# Patient Record
Sex: Female | Born: 1935 | Race: White | Hispanic: No | Marital: Married | Smoking: Never smoker
Health system: Southern US, Community
[De-identification: ages and names within clinical notes are randomized; demographics above are authoritative.]

## PROBLEM LIST (undated history)

## (undated) DIAGNOSIS — E7800 Pure hypercholesterolemia, unspecified: Secondary | ICD-10-CM

## (undated) DIAGNOSIS — K598 Other specified functional intestinal disorders: Secondary | ICD-10-CM

## (undated) DIAGNOSIS — I499 Cardiac arrhythmia, unspecified: Secondary | ICD-10-CM

## (undated) DIAGNOSIS — H3530 Unspecified macular degeneration: Secondary | ICD-10-CM

## (undated) DIAGNOSIS — R42 Dizziness and giddiness: Secondary | ICD-10-CM

## (undated) DIAGNOSIS — M1990 Unspecified osteoarthritis, unspecified site: Secondary | ICD-10-CM

## (undated) DIAGNOSIS — K5981 Ogilvie syndrome: Secondary | ICD-10-CM

## (undated) DIAGNOSIS — Z87442 Personal history of urinary calculi: Secondary | ICD-10-CM

## (undated) DIAGNOSIS — G92 Toxic encephalopathy: Secondary | ICD-10-CM

## (undated) DIAGNOSIS — K219 Gastro-esophageal reflux disease without esophagitis: Secondary | ICD-10-CM

## (undated) DIAGNOSIS — H9190 Unspecified hearing loss, unspecified ear: Secondary | ICD-10-CM

## (undated) DIAGNOSIS — M419 Scoliosis, unspecified: Secondary | ICD-10-CM

## (undated) DIAGNOSIS — I4891 Unspecified atrial fibrillation: Secondary | ICD-10-CM

## (undated) DIAGNOSIS — C801 Malignant (primary) neoplasm, unspecified: Secondary | ICD-10-CM

## (undated) DIAGNOSIS — M7120 Synovial cyst of popliteal space [Baker], unspecified knee: Secondary | ICD-10-CM

## (undated) DIAGNOSIS — G928 Other toxic encephalopathy: Secondary | ICD-10-CM

## (undated) DIAGNOSIS — R296 Repeated falls: Secondary | ICD-10-CM

## (undated) DIAGNOSIS — J9602 Acute respiratory failure with hypercapnia: Secondary | ICD-10-CM

## (undated) DIAGNOSIS — R001 Bradycardia, unspecified: Secondary | ICD-10-CM

## (undated) DIAGNOSIS — R011 Cardiac murmur, unspecified: Secondary | ICD-10-CM

## (undated) DIAGNOSIS — Z8709 Personal history of other diseases of the respiratory system: Secondary | ICD-10-CM

## (undated) DIAGNOSIS — Z8679 Personal history of other diseases of the circulatory system: Secondary | ICD-10-CM

## (undated) DIAGNOSIS — R600 Localized edema: Secondary | ICD-10-CM

## (undated) DIAGNOSIS — I10 Essential (primary) hypertension: Secondary | ICD-10-CM

## (undated) DIAGNOSIS — J9601 Acute respiratory failure with hypoxia: Secondary | ICD-10-CM

## (undated) DIAGNOSIS — IMO0001 Reserved for inherently not codable concepts without codable children: Secondary | ICD-10-CM

## (undated) DIAGNOSIS — K859 Acute pancreatitis without necrosis or infection, unspecified: Secondary | ICD-10-CM

## (undated) HISTORY — PX: OTHER SURGICAL HISTORY: SHX169

## (undated) HISTORY — PX: VAGINAL HYSTERECTOMY: SHX2639

## (undated) HISTORY — PX: DILATION AND CURETTAGE OF UTERUS: SHX78

## (undated) HISTORY — DX: Toxic encephalopathy: G92

## (undated) HISTORY — DX: Other specified functional intestinal disorders: K59.8

## (undated) HISTORY — PX: TONSILLECTOMY: SUR1361

## (undated) HISTORY — DX: Bradycardia, unspecified: R00.1

## (undated) HISTORY — PX: KNEE ARTHROSCOPY: SUR90

## (undated) HISTORY — DX: Pure hypercholesterolemia, unspecified: E78.00

## (undated) HISTORY — DX: Acute respiratory failure with hypoxia: J96.01

## (undated) HISTORY — DX: Ogilvie syndrome: K59.81

## (undated) HISTORY — DX: Other toxic encephalopathy: G92.8

## (undated) HISTORY — PX: CARDIOVERSION: SHX1299

## (undated) HISTORY — DX: Essential (primary) hypertension: I10

## (undated) HISTORY — DX: Unspecified macular degeneration: H35.30

## (undated) HISTORY — DX: Acute respiratory failure with hypercapnia: J96.02

## (undated) HISTORY — DX: Acute pancreatitis without necrosis or infection, unspecified: K85.90

## (undated) HISTORY — DX: Unspecified atrial fibrillation: I48.91

## (undated) HISTORY — PX: FOOT SURGERY: SHX648

---

## 2000-12-14 ENCOUNTER — Encounter: Admission: RE | Admit: 2000-12-14 | Discharge: 2000-12-14 | Payer: Self-pay | Admitting: Family Medicine

## 2001-08-07 ENCOUNTER — Encounter: Admission: RE | Admit: 2001-08-07 | Discharge: 2001-08-07 | Payer: Self-pay | Admitting: Family Medicine

## 2001-08-07 ENCOUNTER — Encounter: Payer: Self-pay | Admitting: Family Medicine

## 2003-01-31 ENCOUNTER — Encounter: Admission: RE | Admit: 2003-01-31 | Discharge: 2003-01-31 | Payer: Self-pay | Admitting: Family Medicine

## 2003-01-31 ENCOUNTER — Encounter: Payer: Self-pay | Admitting: Family Medicine

## 2004-10-08 ENCOUNTER — Encounter: Admission: RE | Admit: 2004-10-08 | Discharge: 2004-10-08 | Payer: Self-pay | Admitting: Family Medicine

## 2006-06-06 ENCOUNTER — Encounter: Admission: RE | Admit: 2006-06-06 | Discharge: 2006-06-06 | Payer: Self-pay | Admitting: Family Medicine

## 2007-06-19 ENCOUNTER — Encounter: Admission: RE | Admit: 2007-06-19 | Discharge: 2007-06-19 | Payer: Self-pay | Admitting: Family Medicine

## 2008-06-19 ENCOUNTER — Encounter: Admission: RE | Admit: 2008-06-19 | Discharge: 2008-06-19 | Payer: Self-pay | Admitting: Family Medicine

## 2009-06-22 ENCOUNTER — Encounter: Admission: RE | Admit: 2009-06-22 | Discharge: 2009-06-22 | Payer: Self-pay | Admitting: Family Medicine

## 2010-07-13 ENCOUNTER — Encounter: Admission: RE | Admit: 2010-07-13 | Discharge: 2010-07-13 | Payer: Self-pay | Admitting: Family Medicine

## 2011-07-25 ENCOUNTER — Other Ambulatory Visit: Payer: Self-pay | Admitting: Family Medicine

## 2011-07-25 DIAGNOSIS — Z1231 Encounter for screening mammogram for malignant neoplasm of breast: Secondary | ICD-10-CM

## 2011-08-24 ENCOUNTER — Ambulatory Visit: Payer: Self-pay

## 2011-08-25 ENCOUNTER — Ambulatory Visit
Admission: RE | Admit: 2011-08-25 | Discharge: 2011-08-25 | Disposition: A | Discharge status: Home / Self Care | Payer: PRIVATE HEALTH INSURANCE | Source: Ambulatory Visit | Attending: Family Medicine | Admitting: Family Medicine

## 2011-08-25 DIAGNOSIS — Z1231 Encounter for screening mammogram for malignant neoplasm of breast: Secondary | ICD-10-CM

## 2011-11-03 DIAGNOSIS — H9190 Unspecified hearing loss, unspecified ear: Secondary | ICD-10-CM | POA: Diagnosis not present

## 2011-11-03 DIAGNOSIS — I10 Essential (primary) hypertension: Secondary | ICD-10-CM | POA: Diagnosis not present

## 2011-11-03 DIAGNOSIS — I4892 Unspecified atrial flutter: Secondary | ICD-10-CM | POA: Diagnosis not present

## 2011-11-03 DIAGNOSIS — E7800 Pure hypercholesterolemia, unspecified: Secondary | ICD-10-CM | POA: Diagnosis not present

## 2011-11-10 DIAGNOSIS — I4892 Unspecified atrial flutter: Secondary | ICD-10-CM | POA: Diagnosis not present

## 2011-11-14 DIAGNOSIS — H353290 Exudative age-related macular degeneration, unspecified eye, stage unspecified: Secondary | ICD-10-CM | POA: Diagnosis not present

## 2011-11-21 DIAGNOSIS — Z7901 Long term (current) use of anticoagulants: Secondary | ICD-10-CM | POA: Diagnosis not present

## 2011-11-21 DIAGNOSIS — I4892 Unspecified atrial flutter: Secondary | ICD-10-CM | POA: Diagnosis not present

## 2011-11-23 DIAGNOSIS — I4892 Unspecified atrial flutter: Secondary | ICD-10-CM | POA: Diagnosis not present

## 2011-11-23 DIAGNOSIS — M1990 Unspecified osteoarthritis, unspecified site: Secondary | ICD-10-CM | POA: Diagnosis not present

## 2011-11-23 DIAGNOSIS — E7800 Pure hypercholesterolemia, unspecified: Secondary | ICD-10-CM | POA: Diagnosis not present

## 2011-11-23 DIAGNOSIS — I10 Essential (primary) hypertension: Secondary | ICD-10-CM | POA: Diagnosis not present

## 2011-11-28 DIAGNOSIS — Z7901 Long term (current) use of anticoagulants: Secondary | ICD-10-CM | POA: Diagnosis not present

## 2011-12-05 DIAGNOSIS — Z7901 Long term (current) use of anticoagulants: Secondary | ICD-10-CM | POA: Diagnosis not present

## 2011-12-05 DIAGNOSIS — I4892 Unspecified atrial flutter: Secondary | ICD-10-CM | POA: Diagnosis not present

## 2011-12-20 DIAGNOSIS — I4892 Unspecified atrial flutter: Secondary | ICD-10-CM | POA: Diagnosis not present

## 2011-12-20 DIAGNOSIS — Z7901 Long term (current) use of anticoagulants: Secondary | ICD-10-CM | POA: Diagnosis not present

## 2011-12-29 DIAGNOSIS — I4892 Unspecified atrial flutter: Secondary | ICD-10-CM | POA: Diagnosis not present

## 2011-12-29 DIAGNOSIS — I10 Essential (primary) hypertension: Secondary | ICD-10-CM | POA: Diagnosis not present

## 2011-12-29 DIAGNOSIS — Z7901 Long term (current) use of anticoagulants: Secondary | ICD-10-CM | POA: Diagnosis not present

## 2012-01-10 DIAGNOSIS — I4892 Unspecified atrial flutter: Secondary | ICD-10-CM | POA: Diagnosis not present

## 2012-01-10 DIAGNOSIS — Z7901 Long term (current) use of anticoagulants: Secondary | ICD-10-CM | POA: Diagnosis not present

## 2012-01-18 DIAGNOSIS — Z7901 Long term (current) use of anticoagulants: Secondary | ICD-10-CM | POA: Diagnosis not present

## 2012-01-18 DIAGNOSIS — I4892 Unspecified atrial flutter: Secondary | ICD-10-CM | POA: Diagnosis not present

## 2012-02-01 DIAGNOSIS — Z7901 Long term (current) use of anticoagulants: Secondary | ICD-10-CM | POA: Diagnosis not present

## 2012-02-01 DIAGNOSIS — I4892 Unspecified atrial flutter: Secondary | ICD-10-CM | POA: Diagnosis not present

## 2012-02-21 DIAGNOSIS — I10 Essential (primary) hypertension: Secondary | ICD-10-CM | POA: Diagnosis not present

## 2012-02-21 DIAGNOSIS — E7800 Pure hypercholesterolemia, unspecified: Secondary | ICD-10-CM | POA: Diagnosis not present

## 2012-02-21 DIAGNOSIS — I4892 Unspecified atrial flutter: Secondary | ICD-10-CM | POA: Diagnosis not present

## 2012-02-21 DIAGNOSIS — Z7901 Long term (current) use of anticoagulants: Secondary | ICD-10-CM | POA: Diagnosis not present

## 2012-03-08 DIAGNOSIS — I4892 Unspecified atrial flutter: Secondary | ICD-10-CM | POA: Diagnosis not present

## 2012-03-08 DIAGNOSIS — Z7901 Long term (current) use of anticoagulants: Secondary | ICD-10-CM | POA: Diagnosis not present

## 2012-03-12 DIAGNOSIS — H353290 Exudative age-related macular degeneration, unspecified eye, stage unspecified: Secondary | ICD-10-CM | POA: Diagnosis not present

## 2012-03-15 DIAGNOSIS — I4892 Unspecified atrial flutter: Secondary | ICD-10-CM | POA: Diagnosis not present

## 2012-03-15 DIAGNOSIS — Z7901 Long term (current) use of anticoagulants: Secondary | ICD-10-CM | POA: Diagnosis not present

## 2012-03-22 DIAGNOSIS — I4892 Unspecified atrial flutter: Secondary | ICD-10-CM | POA: Diagnosis not present

## 2012-03-22 DIAGNOSIS — Z7901 Long term (current) use of anticoagulants: Secondary | ICD-10-CM | POA: Diagnosis not present

## 2012-04-05 DIAGNOSIS — I4892 Unspecified atrial flutter: Secondary | ICD-10-CM | POA: Diagnosis not present

## 2012-04-19 DIAGNOSIS — I4892 Unspecified atrial flutter: Secondary | ICD-10-CM | POA: Diagnosis not present

## 2012-05-12 DIAGNOSIS — L988 Other specified disorders of the skin and subcutaneous tissue: Secondary | ICD-10-CM | POA: Diagnosis not present

## 2012-05-17 DIAGNOSIS — C44529 Squamous cell carcinoma of skin of other part of trunk: Secondary | ICD-10-CM | POA: Diagnosis not present

## 2012-05-17 DIAGNOSIS — M6740 Ganglion, unspecified site: Secondary | ICD-10-CM | POA: Diagnosis not present

## 2012-05-17 DIAGNOSIS — L820 Inflamed seborrheic keratosis: Secondary | ICD-10-CM | POA: Diagnosis not present

## 2012-05-22 DIAGNOSIS — I4892 Unspecified atrial flutter: Secondary | ICD-10-CM | POA: Diagnosis not present

## 2012-05-22 DIAGNOSIS — Z7901 Long term (current) use of anticoagulants: Secondary | ICD-10-CM | POA: Diagnosis not present

## 2012-06-06 DIAGNOSIS — Z85828 Personal history of other malignant neoplasm of skin: Secondary | ICD-10-CM | POA: Diagnosis not present

## 2012-06-06 DIAGNOSIS — M6740 Ganglion, unspecified site: Secondary | ICD-10-CM | POA: Diagnosis not present

## 2012-06-11 DIAGNOSIS — Z7901 Long term (current) use of anticoagulants: Secondary | ICD-10-CM | POA: Diagnosis not present

## 2012-06-11 DIAGNOSIS — I4892 Unspecified atrial flutter: Secondary | ICD-10-CM | POA: Diagnosis not present

## 2012-07-11 DIAGNOSIS — I4892 Unspecified atrial flutter: Secondary | ICD-10-CM | POA: Diagnosis not present

## 2012-07-11 DIAGNOSIS — E7800 Pure hypercholesterolemia, unspecified: Secondary | ICD-10-CM | POA: Diagnosis not present

## 2012-07-11 DIAGNOSIS — Z7901 Long term (current) use of anticoagulants: Secondary | ICD-10-CM | POA: Diagnosis not present

## 2012-07-11 DIAGNOSIS — I10 Essential (primary) hypertension: Secondary | ICD-10-CM | POA: Diagnosis not present

## 2012-07-23 DIAGNOSIS — H35369 Drusen (degenerative) of macula, unspecified eye: Secondary | ICD-10-CM | POA: Diagnosis not present

## 2012-07-23 DIAGNOSIS — H353290 Exudative age-related macular degeneration, unspecified eye, stage unspecified: Secondary | ICD-10-CM | POA: Diagnosis not present

## 2012-07-23 DIAGNOSIS — H269 Unspecified cataract: Secondary | ICD-10-CM | POA: Diagnosis not present

## 2012-07-26 DIAGNOSIS — I4892 Unspecified atrial flutter: Secondary | ICD-10-CM | POA: Diagnosis not present

## 2012-07-27 DIAGNOSIS — I4891 Unspecified atrial fibrillation: Secondary | ICD-10-CM | POA: Diagnosis not present

## 2012-08-15 DIAGNOSIS — E7800 Pure hypercholesterolemia, unspecified: Secondary | ICD-10-CM | POA: Diagnosis not present

## 2012-08-15 DIAGNOSIS — I10 Essential (primary) hypertension: Secondary | ICD-10-CM | POA: Diagnosis not present

## 2012-08-15 DIAGNOSIS — I4892 Unspecified atrial flutter: Secondary | ICD-10-CM | POA: Diagnosis not present

## 2012-08-17 DIAGNOSIS — Z23 Encounter for immunization: Secondary | ICD-10-CM | POA: Diagnosis not present

## 2012-08-28 ENCOUNTER — Other Ambulatory Visit: Payer: Self-pay | Admitting: Family Medicine

## 2012-08-28 DIAGNOSIS — Z1231 Encounter for screening mammogram for malignant neoplasm of breast: Secondary | ICD-10-CM

## 2012-08-30 DIAGNOSIS — K219 Gastro-esophageal reflux disease without esophagitis: Secondary | ICD-10-CM | POA: Diagnosis not present

## 2012-08-30 DIAGNOSIS — E7800 Pure hypercholesterolemia, unspecified: Secondary | ICD-10-CM | POA: Diagnosis not present

## 2012-08-30 DIAGNOSIS — Z79899 Other long term (current) drug therapy: Secondary | ICD-10-CM | POA: Diagnosis not present

## 2012-08-30 DIAGNOSIS — I10 Essential (primary) hypertension: Secondary | ICD-10-CM | POA: Diagnosis not present

## 2012-08-30 DIAGNOSIS — R109 Unspecified abdominal pain: Secondary | ICD-10-CM | POA: Diagnosis not present

## 2012-09-03 DIAGNOSIS — R109 Unspecified abdominal pain: Secondary | ICD-10-CM | POA: Diagnosis not present

## 2012-09-03 DIAGNOSIS — I4891 Unspecified atrial fibrillation: Secondary | ICD-10-CM | POA: Diagnosis not present

## 2012-10-09 ENCOUNTER — Ambulatory Visit
Admission: RE | Admit: 2012-10-09 | Discharge: 2012-10-09 | Disposition: A | Discharge status: Home / Self Care | Payer: PRIVATE HEALTH INSURANCE | Source: Ambulatory Visit | Attending: Family Medicine | Admitting: Family Medicine

## 2012-10-09 DIAGNOSIS — Z1231 Encounter for screening mammogram for malignant neoplasm of breast: Secondary | ICD-10-CM | POA: Diagnosis not present

## 2012-10-29 DIAGNOSIS — I4891 Unspecified atrial fibrillation: Secondary | ICD-10-CM | POA: Diagnosis not present

## 2012-11-13 DIAGNOSIS — I4891 Unspecified atrial fibrillation: Secondary | ICD-10-CM | POA: Diagnosis not present

## 2012-11-13 DIAGNOSIS — Z7901 Long term (current) use of anticoagulants: Secondary | ICD-10-CM | POA: Diagnosis not present

## 2012-12-17 DIAGNOSIS — H353290 Exudative age-related macular degeneration, unspecified eye, stage unspecified: Secondary | ICD-10-CM | POA: Diagnosis not present

## 2012-12-17 DIAGNOSIS — H269 Unspecified cataract: Secondary | ICD-10-CM | POA: Diagnosis not present

## 2012-12-24 DIAGNOSIS — I4892 Unspecified atrial flutter: Secondary | ICD-10-CM | POA: Diagnosis not present

## 2012-12-24 DIAGNOSIS — M810 Age-related osteoporosis without current pathological fracture: Secondary | ICD-10-CM | POA: Diagnosis not present

## 2012-12-24 DIAGNOSIS — I10 Essential (primary) hypertension: Secondary | ICD-10-CM | POA: Diagnosis not present

## 2012-12-24 DIAGNOSIS — E7800 Pure hypercholesterolemia, unspecified: Secondary | ICD-10-CM | POA: Diagnosis not present

## 2013-01-08 DIAGNOSIS — I4891 Unspecified atrial fibrillation: Secondary | ICD-10-CM | POA: Diagnosis not present

## 2013-01-21 DIAGNOSIS — L820 Inflamed seborrheic keratosis: Secondary | ICD-10-CM | POA: Diagnosis not present

## 2013-01-21 DIAGNOSIS — Z85828 Personal history of other malignant neoplasm of skin: Secondary | ICD-10-CM | POA: Diagnosis not present

## 2013-02-21 DIAGNOSIS — I4892 Unspecified atrial flutter: Secondary | ICD-10-CM | POA: Diagnosis not present

## 2013-02-21 DIAGNOSIS — M810 Age-related osteoporosis without current pathological fracture: Secondary | ICD-10-CM | POA: Diagnosis not present

## 2013-02-21 DIAGNOSIS — E7800 Pure hypercholesterolemia, unspecified: Secondary | ICD-10-CM | POA: Diagnosis not present

## 2013-02-21 DIAGNOSIS — I10 Essential (primary) hypertension: Secondary | ICD-10-CM | POA: Diagnosis not present

## 2013-03-05 DIAGNOSIS — I4891 Unspecified atrial fibrillation: Secondary | ICD-10-CM | POA: Diagnosis not present

## 2013-03-14 DIAGNOSIS — G4733 Obstructive sleep apnea (adult) (pediatric): Secondary | ICD-10-CM | POA: Diagnosis not present

## 2013-03-26 DIAGNOSIS — J029 Acute pharyngitis, unspecified: Secondary | ICD-10-CM | POA: Diagnosis not present

## 2013-04-24 DIAGNOSIS — I4891 Unspecified atrial fibrillation: Secondary | ICD-10-CM | POA: Diagnosis not present

## 2013-05-17 DIAGNOSIS — H353290 Exudative age-related macular degeneration, unspecified eye, stage unspecified: Secondary | ICD-10-CM | POA: Diagnosis not present

## 2013-05-17 DIAGNOSIS — H269 Unspecified cataract: Secondary | ICD-10-CM | POA: Diagnosis not present

## 2013-06-17 DIAGNOSIS — I4891 Unspecified atrial fibrillation: Secondary | ICD-10-CM | POA: Diagnosis not present

## 2013-08-14 DIAGNOSIS — I4891 Unspecified atrial fibrillation: Secondary | ICD-10-CM | POA: Diagnosis not present

## 2013-08-16 DIAGNOSIS — I10 Essential (primary) hypertension: Secondary | ICD-10-CM | POA: Diagnosis not present

## 2013-08-16 DIAGNOSIS — Z23 Encounter for immunization: Secondary | ICD-10-CM | POA: Diagnosis not present

## 2013-08-16 DIAGNOSIS — E7800 Pure hypercholesterolemia, unspecified: Secondary | ICD-10-CM | POA: Diagnosis not present

## 2013-08-16 DIAGNOSIS — K219 Gastro-esophageal reflux disease without esophagitis: Secondary | ICD-10-CM | POA: Diagnosis not present

## 2013-08-16 DIAGNOSIS — I4892 Unspecified atrial flutter: Secondary | ICD-10-CM | POA: Diagnosis not present

## 2013-08-16 DIAGNOSIS — Z0000 Encounter for general adult medical examination without abnormal findings: Secondary | ICD-10-CM | POA: Diagnosis not present

## 2013-08-16 DIAGNOSIS — Z79899 Other long term (current) drug therapy: Secondary | ICD-10-CM | POA: Diagnosis not present

## 2013-08-19 DIAGNOSIS — Z85828 Personal history of other malignant neoplasm of skin: Secondary | ICD-10-CM | POA: Diagnosis not present

## 2013-08-19 DIAGNOSIS — L570 Actinic keratosis: Secondary | ICD-10-CM | POA: Diagnosis not present

## 2013-12-02 DIAGNOSIS — I4891 Unspecified atrial fibrillation: Secondary | ICD-10-CM | POA: Diagnosis not present

## 2013-12-04 DIAGNOSIS — H2510 Age-related nuclear cataract, unspecified eye: Secondary | ICD-10-CM | POA: Diagnosis not present

## 2013-12-04 DIAGNOSIS — H43819 Vitreous degeneration, unspecified eye: Secondary | ICD-10-CM | POA: Diagnosis not present

## 2013-12-04 DIAGNOSIS — H353290 Exudative age-related macular degeneration, unspecified eye, stage unspecified: Secondary | ICD-10-CM | POA: Diagnosis not present

## 2014-01-20 DIAGNOSIS — I10 Essential (primary) hypertension: Secondary | ICD-10-CM | POA: Diagnosis not present

## 2014-01-20 DIAGNOSIS — Z7901 Long term (current) use of anticoagulants: Secondary | ICD-10-CM | POA: Diagnosis not present

## 2014-01-20 DIAGNOSIS — I4891 Unspecified atrial fibrillation: Secondary | ICD-10-CM | POA: Diagnosis not present

## 2014-01-20 DIAGNOSIS — I4892 Unspecified atrial flutter: Secondary | ICD-10-CM | POA: Diagnosis not present

## 2014-01-20 DIAGNOSIS — E7800 Pure hypercholesterolemia, unspecified: Secondary | ICD-10-CM | POA: Diagnosis not present

## 2014-01-22 DIAGNOSIS — I4891 Unspecified atrial fibrillation: Secondary | ICD-10-CM | POA: Diagnosis not present

## 2014-01-22 DIAGNOSIS — I10 Essential (primary) hypertension: Secondary | ICD-10-CM | POA: Diagnosis not present

## 2014-01-22 DIAGNOSIS — E7800 Pure hypercholesterolemia, unspecified: Secondary | ICD-10-CM | POA: Diagnosis not present

## 2014-01-28 DIAGNOSIS — I4891 Unspecified atrial fibrillation: Secondary | ICD-10-CM | POA: Diagnosis not present

## 2014-01-28 DIAGNOSIS — I4892 Unspecified atrial flutter: Secondary | ICD-10-CM | POA: Diagnosis not present

## 2014-01-28 DIAGNOSIS — E7800 Pure hypercholesterolemia, unspecified: Secondary | ICD-10-CM | POA: Diagnosis not present

## 2014-01-30 DIAGNOSIS — Z7901 Long term (current) use of anticoagulants: Secondary | ICD-10-CM | POA: Diagnosis not present

## 2014-01-30 DIAGNOSIS — I4891 Unspecified atrial fibrillation: Secondary | ICD-10-CM | POA: Diagnosis not present

## 2014-01-30 DIAGNOSIS — E7800 Pure hypercholesterolemia, unspecified: Secondary | ICD-10-CM | POA: Diagnosis not present

## 2014-01-30 DIAGNOSIS — I10 Essential (primary) hypertension: Secondary | ICD-10-CM | POA: Diagnosis not present

## 2014-02-13 DIAGNOSIS — I4891 Unspecified atrial fibrillation: Secondary | ICD-10-CM | POA: Diagnosis not present

## 2014-02-13 DIAGNOSIS — I10 Essential (primary) hypertension: Secondary | ICD-10-CM | POA: Diagnosis not present

## 2014-02-13 DIAGNOSIS — Z7901 Long term (current) use of anticoagulants: Secondary | ICD-10-CM | POA: Diagnosis not present

## 2014-02-13 DIAGNOSIS — E7800 Pure hypercholesterolemia, unspecified: Secondary | ICD-10-CM | POA: Diagnosis not present

## 2014-02-17 DIAGNOSIS — Z85828 Personal history of other malignant neoplasm of skin: Secondary | ICD-10-CM | POA: Diagnosis not present

## 2014-02-17 DIAGNOSIS — L820 Inflamed seborrheic keratosis: Secondary | ICD-10-CM | POA: Diagnosis not present

## 2014-02-17 DIAGNOSIS — L570 Actinic keratosis: Secondary | ICD-10-CM | POA: Diagnosis not present

## 2014-02-17 DIAGNOSIS — D485 Neoplasm of uncertain behavior of skin: Secondary | ICD-10-CM | POA: Diagnosis not present

## 2014-03-17 DIAGNOSIS — I4892 Unspecified atrial flutter: Secondary | ICD-10-CM | POA: Diagnosis not present

## 2014-03-17 DIAGNOSIS — I10 Essential (primary) hypertension: Secondary | ICD-10-CM | POA: Diagnosis not present

## 2014-03-17 DIAGNOSIS — E7800 Pure hypercholesterolemia, unspecified: Secondary | ICD-10-CM | POA: Diagnosis not present

## 2014-03-17 DIAGNOSIS — I4891 Unspecified atrial fibrillation: Secondary | ICD-10-CM | POA: Diagnosis not present

## 2014-03-26 DIAGNOSIS — D485 Neoplasm of uncertain behavior of skin: Secondary | ICD-10-CM | POA: Diagnosis not present

## 2014-03-26 DIAGNOSIS — L820 Inflamed seborrheic keratosis: Secondary | ICD-10-CM | POA: Diagnosis not present

## 2014-04-03 DIAGNOSIS — I4891 Unspecified atrial fibrillation: Secondary | ICD-10-CM | POA: Diagnosis not present

## 2014-04-10 DIAGNOSIS — I4891 Unspecified atrial fibrillation: Secondary | ICD-10-CM | POA: Diagnosis not present

## 2014-04-16 DIAGNOSIS — I4892 Unspecified atrial flutter: Secondary | ICD-10-CM | POA: Diagnosis not present

## 2014-04-16 DIAGNOSIS — H9190 Unspecified hearing loss, unspecified ear: Secondary | ICD-10-CM | POA: Diagnosis not present

## 2014-04-16 DIAGNOSIS — E7800 Pure hypercholesterolemia, unspecified: Secondary | ICD-10-CM | POA: Diagnosis not present

## 2014-04-16 DIAGNOSIS — I10 Essential (primary) hypertension: Secondary | ICD-10-CM | POA: Diagnosis not present

## 2014-04-16 DIAGNOSIS — I4891 Unspecified atrial fibrillation: Secondary | ICD-10-CM | POA: Diagnosis not present

## 2014-05-30 DIAGNOSIS — I10 Essential (primary) hypertension: Secondary | ICD-10-CM | POA: Diagnosis not present

## 2014-05-30 DIAGNOSIS — I4891 Unspecified atrial fibrillation: Secondary | ICD-10-CM | POA: Diagnosis not present

## 2014-05-30 DIAGNOSIS — Z7901 Long term (current) use of anticoagulants: Secondary | ICD-10-CM | POA: Diagnosis not present

## 2014-06-04 DIAGNOSIS — H353290 Exudative age-related macular degeneration, unspecified eye, stage unspecified: Secondary | ICD-10-CM | POA: Diagnosis not present

## 2014-06-04 DIAGNOSIS — H43819 Vitreous degeneration, unspecified eye: Secondary | ICD-10-CM | POA: Diagnosis not present

## 2014-06-04 DIAGNOSIS — H31019 Macula scars of posterior pole (postinflammatory) (post-traumatic), unspecified eye: Secondary | ICD-10-CM | POA: Diagnosis not present

## 2014-06-04 DIAGNOSIS — H2510 Age-related nuclear cataract, unspecified eye: Secondary | ICD-10-CM | POA: Diagnosis not present

## 2014-06-10 DIAGNOSIS — E7800 Pure hypercholesterolemia, unspecified: Secondary | ICD-10-CM | POA: Diagnosis not present

## 2014-06-10 DIAGNOSIS — G4733 Obstructive sleep apnea (adult) (pediatric): Secondary | ICD-10-CM | POA: Diagnosis not present

## 2014-06-10 DIAGNOSIS — I10 Essential (primary) hypertension: Secondary | ICD-10-CM | POA: Diagnosis not present

## 2014-06-10 DIAGNOSIS — I4891 Unspecified atrial fibrillation: Secondary | ICD-10-CM | POA: Diagnosis not present

## 2014-06-10 DIAGNOSIS — I4892 Unspecified atrial flutter: Secondary | ICD-10-CM | POA: Diagnosis not present

## 2014-06-16 DIAGNOSIS — I4891 Unspecified atrial fibrillation: Secondary | ICD-10-CM | POA: Diagnosis not present

## 2014-06-16 DIAGNOSIS — Z79899 Other long term (current) drug therapy: Secondary | ICD-10-CM | POA: Diagnosis not present

## 2014-06-23 DIAGNOSIS — I10 Essential (primary) hypertension: Secondary | ICD-10-CM | POA: Diagnosis not present

## 2014-06-23 DIAGNOSIS — I4891 Unspecified atrial fibrillation: Secondary | ICD-10-CM | POA: Diagnosis not present

## 2014-06-23 DIAGNOSIS — J984 Other disorders of lung: Secondary | ICD-10-CM | POA: Diagnosis not present

## 2014-06-23 DIAGNOSIS — K219 Gastro-esophageal reflux disease without esophagitis: Secondary | ICD-10-CM | POA: Diagnosis not present

## 2014-06-23 DIAGNOSIS — Z79899 Other long term (current) drug therapy: Secondary | ICD-10-CM | POA: Diagnosis not present

## 2014-06-23 DIAGNOSIS — I4892 Unspecified atrial flutter: Secondary | ICD-10-CM | POA: Diagnosis not present

## 2014-07-15 DIAGNOSIS — R942 Abnormal results of pulmonary function studies: Secondary | ICD-10-CM | POA: Diagnosis not present

## 2014-07-15 DIAGNOSIS — I4892 Unspecified atrial flutter: Secondary | ICD-10-CM | POA: Diagnosis not present

## 2014-07-15 DIAGNOSIS — I10 Essential (primary) hypertension: Secondary | ICD-10-CM | POA: Diagnosis not present

## 2014-07-15 DIAGNOSIS — G4733 Obstructive sleep apnea (adult) (pediatric): Secondary | ICD-10-CM | POA: Diagnosis not present

## 2014-07-15 DIAGNOSIS — I4891 Unspecified atrial fibrillation: Secondary | ICD-10-CM | POA: Diagnosis not present

## 2014-08-11 DIAGNOSIS — R42 Dizziness and giddiness: Secondary | ICD-10-CM | POA: Diagnosis not present

## 2014-08-11 DIAGNOSIS — I10 Essential (primary) hypertension: Secondary | ICD-10-CM | POA: Diagnosis not present

## 2014-08-19 DIAGNOSIS — M76822 Posterior tibial tendinitis, left leg: Secondary | ICD-10-CM | POA: Diagnosis not present

## 2014-08-19 DIAGNOSIS — M216X2 Other acquired deformities of left foot: Secondary | ICD-10-CM | POA: Diagnosis not present

## 2014-08-19 DIAGNOSIS — M25372 Other instability, left ankle: Secondary | ICD-10-CM | POA: Diagnosis not present

## 2014-08-20 DIAGNOSIS — R42 Dizziness and giddiness: Secondary | ICD-10-CM | POA: Diagnosis not present

## 2014-08-21 DIAGNOSIS — Z23 Encounter for immunization: Secondary | ICD-10-CM | POA: Diagnosis not present

## 2014-08-25 DIAGNOSIS — Z08 Encounter for follow-up examination after completed treatment for malignant neoplasm: Secondary | ICD-10-CM | POA: Diagnosis not present

## 2014-08-25 DIAGNOSIS — Z85828 Personal history of other malignant neoplasm of skin: Secondary | ICD-10-CM | POA: Diagnosis not present

## 2014-08-25 DIAGNOSIS — L570 Actinic keratosis: Secondary | ICD-10-CM | POA: Diagnosis not present

## 2014-08-26 DIAGNOSIS — M1712 Unilateral primary osteoarthritis, left knee: Secondary | ICD-10-CM | POA: Diagnosis not present

## 2014-08-26 DIAGNOSIS — E780 Pure hypercholesterolemia: Secondary | ICD-10-CM | POA: Diagnosis not present

## 2014-08-26 DIAGNOSIS — I4891 Unspecified atrial fibrillation: Secondary | ICD-10-CM | POA: Diagnosis not present

## 2014-08-26 DIAGNOSIS — I10 Essential (primary) hypertension: Secondary | ICD-10-CM | POA: Diagnosis not present

## 2014-08-26 DIAGNOSIS — Z0000 Encounter for general adult medical examination without abnormal findings: Secondary | ICD-10-CM | POA: Diagnosis not present

## 2014-08-26 DIAGNOSIS — K219 Gastro-esophageal reflux disease without esophagitis: Secondary | ICD-10-CM | POA: Diagnosis not present

## 2014-08-26 DIAGNOSIS — Z79899 Other long term (current) drug therapy: Secondary | ICD-10-CM | POA: Diagnosis not present

## 2014-08-26 DIAGNOSIS — Z23 Encounter for immunization: Secondary | ICD-10-CM | POA: Diagnosis not present

## 2014-09-05 DIAGNOSIS — I482 Chronic atrial fibrillation: Secondary | ICD-10-CM | POA: Diagnosis not present

## 2014-09-05 DIAGNOSIS — M25561 Pain in right knee: Secondary | ICD-10-CM | POA: Diagnosis not present

## 2014-09-05 DIAGNOSIS — Z7901 Long term (current) use of anticoagulants: Secondary | ICD-10-CM | POA: Diagnosis not present

## 2014-09-05 DIAGNOSIS — M25562 Pain in left knee: Secondary | ICD-10-CM | POA: Diagnosis not present

## 2014-09-09 ENCOUNTER — Other Ambulatory Visit (HOSPITAL_COMMUNITY): Payer: Self-pay | Admitting: Cardiology

## 2014-09-09 ENCOUNTER — Ambulatory Visit (HOSPITAL_COMMUNITY): Payer: Medicare Other | Attending: Cardiology | Admitting: Cardiology

## 2014-09-09 DIAGNOSIS — I10 Essential (primary) hypertension: Secondary | ICD-10-CM | POA: Insufficient documentation

## 2014-09-09 DIAGNOSIS — M25561 Pain in right knee: Secondary | ICD-10-CM | POA: Diagnosis not present

## 2014-09-09 DIAGNOSIS — E785 Hyperlipidemia, unspecified: Secondary | ICD-10-CM | POA: Insufficient documentation

## 2014-09-09 DIAGNOSIS — M79604 Pain in right leg: Secondary | ICD-10-CM | POA: Diagnosis not present

## 2014-09-09 DIAGNOSIS — M7989 Other specified soft tissue disorders: Secondary | ICD-10-CM

## 2014-09-09 NOTE — Progress Notes (Signed)
Right lower venous duplex performed  

## 2014-09-10 DIAGNOSIS — R2241 Localized swelling, mass and lump, right lower limb: Secondary | ICD-10-CM | POA: Diagnosis not present

## 2014-09-10 DIAGNOSIS — M79604 Pain in right leg: Secondary | ICD-10-CM | POA: Diagnosis not present

## 2014-09-11 DIAGNOSIS — M1711 Unilateral primary osteoarthritis, right knee: Secondary | ICD-10-CM | POA: Diagnosis not present

## 2014-09-11 DIAGNOSIS — Z79899 Other long term (current) drug therapy: Secondary | ICD-10-CM | POA: Diagnosis not present

## 2014-09-16 DIAGNOSIS — M25561 Pain in right knee: Secondary | ICD-10-CM | POA: Diagnosis not present

## 2014-09-30 DIAGNOSIS — M25561 Pain in right knee: Secondary | ICD-10-CM | POA: Diagnosis not present

## 2014-10-06 DIAGNOSIS — Z7901 Long term (current) use of anticoagulants: Secondary | ICD-10-CM | POA: Diagnosis not present

## 2014-10-07 DIAGNOSIS — E780 Pure hypercholesterolemia: Secondary | ICD-10-CM | POA: Diagnosis not present

## 2014-10-07 DIAGNOSIS — I4892 Unspecified atrial flutter: Secondary | ICD-10-CM | POA: Diagnosis not present

## 2014-10-07 DIAGNOSIS — I4891 Unspecified atrial fibrillation: Secondary | ICD-10-CM | POA: Diagnosis not present

## 2014-10-07 DIAGNOSIS — I10 Essential (primary) hypertension: Secondary | ICD-10-CM | POA: Diagnosis not present

## 2014-10-07 DIAGNOSIS — G4733 Obstructive sleep apnea (adult) (pediatric): Secondary | ICD-10-CM | POA: Diagnosis not present

## 2014-10-10 DIAGNOSIS — Z7901 Long term (current) use of anticoagulants: Secondary | ICD-10-CM | POA: Diagnosis not present

## 2014-11-24 DIAGNOSIS — I4891 Unspecified atrial fibrillation: Secondary | ICD-10-CM | POA: Diagnosis not present

## 2014-12-31 DIAGNOSIS — M25561 Pain in right knee: Secondary | ICD-10-CM | POA: Diagnosis not present

## 2015-01-09 DIAGNOSIS — Z01818 Encounter for other preprocedural examination: Secondary | ICD-10-CM | POA: Diagnosis not present

## 2015-01-12 DIAGNOSIS — I4891 Unspecified atrial fibrillation: Secondary | ICD-10-CM | POA: Diagnosis not present

## 2015-01-15 DIAGNOSIS — Z01810 Encounter for preprocedural cardiovascular examination: Secondary | ICD-10-CM | POA: Diagnosis not present

## 2015-01-15 DIAGNOSIS — I480 Paroxysmal atrial fibrillation: Secondary | ICD-10-CM | POA: Diagnosis not present

## 2015-01-29 DIAGNOSIS — S83241A Other tear of medial meniscus, current injury, right knee, initial encounter: Secondary | ICD-10-CM | POA: Diagnosis not present

## 2015-01-29 DIAGNOSIS — S83281A Other tear of lateral meniscus, current injury, right knee, initial encounter: Secondary | ICD-10-CM | POA: Diagnosis not present

## 2015-01-29 DIAGNOSIS — Y929 Unspecified place or not applicable: Secondary | ICD-10-CM | POA: Diagnosis not present

## 2015-01-29 DIAGNOSIS — X58XXXA Exposure to other specified factors, initial encounter: Secondary | ICD-10-CM | POA: Diagnosis not present

## 2015-01-29 DIAGNOSIS — M94261 Chondromalacia, right knee: Secondary | ICD-10-CM | POA: Diagnosis not present

## 2015-02-04 DIAGNOSIS — S83241D Other tear of medial meniscus, current injury, right knee, subsequent encounter: Secondary | ICD-10-CM | POA: Diagnosis not present

## 2015-02-04 DIAGNOSIS — S83281D Other tear of lateral meniscus, current injury, right knee, subsequent encounter: Secondary | ICD-10-CM | POA: Diagnosis not present

## 2015-02-10 DIAGNOSIS — H2513 Age-related nuclear cataract, bilateral: Secondary | ICD-10-CM | POA: Diagnosis not present

## 2015-02-10 DIAGNOSIS — H3532 Exudative age-related macular degeneration: Secondary | ICD-10-CM | POA: Diagnosis not present

## 2015-02-10 DIAGNOSIS — H43813 Vitreous degeneration, bilateral: Secondary | ICD-10-CM | POA: Diagnosis not present

## 2015-02-23 DIAGNOSIS — Z08 Encounter for follow-up examination after completed treatment for malignant neoplasm: Secondary | ICD-10-CM | POA: Diagnosis not present

## 2015-02-23 DIAGNOSIS — L820 Inflamed seborrheic keratosis: Secondary | ICD-10-CM | POA: Diagnosis not present

## 2015-02-23 DIAGNOSIS — L570 Actinic keratosis: Secondary | ICD-10-CM | POA: Diagnosis not present

## 2015-02-23 DIAGNOSIS — Z85828 Personal history of other malignant neoplasm of skin: Secondary | ICD-10-CM | POA: Diagnosis not present

## 2015-03-04 DIAGNOSIS — S83241D Other tear of medial meniscus, current injury, right knee, subsequent encounter: Secondary | ICD-10-CM | POA: Diagnosis not present

## 2015-03-04 DIAGNOSIS — S83281D Other tear of lateral meniscus, current injury, right knee, subsequent encounter: Secondary | ICD-10-CM | POA: Diagnosis not present

## 2015-03-11 DIAGNOSIS — I4891 Unspecified atrial fibrillation: Secondary | ICD-10-CM | POA: Diagnosis not present

## 2015-04-24 DIAGNOSIS — D72829 Elevated white blood cell count, unspecified: Secondary | ICD-10-CM | POA: Diagnosis not present

## 2015-04-24 DIAGNOSIS — J9602 Acute respiratory failure with hypercapnia: Secondary | ICD-10-CM | POA: Diagnosis not present

## 2015-04-24 DIAGNOSIS — I872 Venous insufficiency (chronic) (peripheral): Secondary | ICD-10-CM | POA: Diagnosis present

## 2015-04-24 DIAGNOSIS — K838 Other specified diseases of biliary tract: Secondary | ICD-10-CM | POA: Diagnosis not present

## 2015-04-24 DIAGNOSIS — Z8249 Family history of ischemic heart disease and other diseases of the circulatory system: Secondary | ICD-10-CM | POA: Diagnosis not present

## 2015-04-24 DIAGNOSIS — R1013 Epigastric pain: Secondary | ICD-10-CM | POA: Diagnosis not present

## 2015-04-24 DIAGNOSIS — I482 Chronic atrial fibrillation: Secondary | ICD-10-CM | POA: Diagnosis not present

## 2015-04-24 DIAGNOSIS — R112 Nausea with vomiting, unspecified: Secondary | ICD-10-CM | POA: Diagnosis not present

## 2015-04-24 DIAGNOSIS — E8770 Fluid overload, unspecified: Secondary | ICD-10-CM | POA: Diagnosis not present

## 2015-04-24 DIAGNOSIS — Z883 Allergy status to other anti-infective agents status: Secondary | ICD-10-CM | POA: Diagnosis not present

## 2015-04-24 DIAGNOSIS — R109 Unspecified abdominal pain: Secondary | ICD-10-CM | POA: Diagnosis not present

## 2015-04-24 DIAGNOSIS — N281 Cyst of kidney, acquired: Secondary | ICD-10-CM | POA: Diagnosis not present

## 2015-04-24 DIAGNOSIS — R140 Abdominal distension (gaseous): Secondary | ICD-10-CM | POA: Diagnosis not present

## 2015-04-24 DIAGNOSIS — K5669 Other intestinal obstruction: Secondary | ICD-10-CM | POA: Diagnosis not present

## 2015-04-24 DIAGNOSIS — I10 Essential (primary) hypertension: Secondary | ICD-10-CM | POA: Diagnosis present

## 2015-04-24 DIAGNOSIS — R769 Abnormal immunological finding in serum, unspecified: Secondary | ICD-10-CM | POA: Diagnosis not present

## 2015-04-24 DIAGNOSIS — G9341 Metabolic encephalopathy: Secondary | ICD-10-CM | POA: Diagnosis not present

## 2015-04-24 DIAGNOSIS — K6389 Other specified diseases of intestine: Secondary | ICD-10-CM | POA: Diagnosis not present

## 2015-04-24 DIAGNOSIS — K859 Acute pancreatitis, unspecified: Secondary | ICD-10-CM | POA: Diagnosis not present

## 2015-04-24 DIAGNOSIS — R918 Other nonspecific abnormal finding of lung field: Secondary | ICD-10-CM | POA: Diagnosis not present

## 2015-04-24 DIAGNOSIS — H9190 Unspecified hearing loss, unspecified ear: Secondary | ICD-10-CM | POA: Diagnosis present

## 2015-04-24 DIAGNOSIS — K5790 Diverticulosis of intestine, part unspecified, without perforation or abscess without bleeding: Secondary | ICD-10-CM | POA: Diagnosis not present

## 2015-04-24 DIAGNOSIS — G92 Toxic encephalopathy: Secondary | ICD-10-CM | POA: Diagnosis not present

## 2015-04-24 DIAGNOSIS — K567 Ileus, unspecified: Secondary | ICD-10-CM | POA: Diagnosis not present

## 2015-04-24 DIAGNOSIS — F05 Delirium due to known physiological condition: Secondary | ICD-10-CM | POA: Diagnosis present

## 2015-04-24 DIAGNOSIS — M1990 Unspecified osteoarthritis, unspecified site: Secondary | ICD-10-CM | POA: Diagnosis present

## 2015-04-24 DIAGNOSIS — J9601 Acute respiratory failure with hypoxia: Secondary | ICD-10-CM | POA: Diagnosis not present

## 2015-04-24 DIAGNOSIS — K598 Other specified functional intestinal disorders: Secondary | ICD-10-CM | POA: Diagnosis present

## 2015-04-24 DIAGNOSIS — E785 Hyperlipidemia, unspecified: Secondary | ICD-10-CM | POA: Diagnosis not present

## 2015-04-24 DIAGNOSIS — T40605A Adverse effect of unspecified narcotics, initial encounter: Secondary | ICD-10-CM | POA: Diagnosis not present

## 2015-04-24 DIAGNOSIS — I4891 Unspecified atrial fibrillation: Secondary | ICD-10-CM | POA: Diagnosis not present

## 2015-04-24 DIAGNOSIS — R600 Localized edema: Secondary | ICD-10-CM | POA: Diagnosis not present

## 2015-04-24 DIAGNOSIS — Z7901 Long term (current) use of anticoagulants: Secondary | ICD-10-CM | POA: Diagnosis not present

## 2015-05-06 DIAGNOSIS — I482 Chronic atrial fibrillation: Secondary | ICD-10-CM | POA: Diagnosis not present

## 2015-05-06 DIAGNOSIS — Z7901 Long term (current) use of anticoagulants: Secondary | ICD-10-CM | POA: Diagnosis not present

## 2015-05-06 DIAGNOSIS — E785 Hyperlipidemia, unspecified: Secondary | ICD-10-CM | POA: Diagnosis not present

## 2015-05-06 DIAGNOSIS — I4891 Unspecified atrial fibrillation: Secondary | ICD-10-CM | POA: Diagnosis not present

## 2015-05-06 DIAGNOSIS — R600 Localized edema: Secondary | ICD-10-CM | POA: Diagnosis not present

## 2015-05-06 DIAGNOSIS — M1990 Unspecified osteoarthritis, unspecified site: Secondary | ICD-10-CM | POA: Diagnosis not present

## 2015-05-06 DIAGNOSIS — I10 Essential (primary) hypertension: Secondary | ICD-10-CM | POA: Diagnosis not present

## 2015-05-08 DIAGNOSIS — E785 Hyperlipidemia, unspecified: Secondary | ICD-10-CM | POA: Diagnosis not present

## 2015-05-08 DIAGNOSIS — I10 Essential (primary) hypertension: Secondary | ICD-10-CM | POA: Diagnosis not present

## 2015-05-08 DIAGNOSIS — M1990 Unspecified osteoarthritis, unspecified site: Secondary | ICD-10-CM | POA: Diagnosis not present

## 2015-05-08 DIAGNOSIS — I482 Chronic atrial fibrillation: Secondary | ICD-10-CM | POA: Diagnosis not present

## 2015-05-08 DIAGNOSIS — R600 Localized edema: Secondary | ICD-10-CM | POA: Diagnosis not present

## 2015-05-08 DIAGNOSIS — Z7901 Long term (current) use of anticoagulants: Secondary | ICD-10-CM | POA: Diagnosis not present

## 2015-05-11 DIAGNOSIS — I482 Chronic atrial fibrillation: Secondary | ICD-10-CM | POA: Diagnosis not present

## 2015-05-12 DIAGNOSIS — E785 Hyperlipidemia, unspecified: Secondary | ICD-10-CM | POA: Diagnosis not present

## 2015-05-12 DIAGNOSIS — I4891 Unspecified atrial fibrillation: Secondary | ICD-10-CM | POA: Diagnosis not present

## 2015-05-12 DIAGNOSIS — R600 Localized edema: Secondary | ICD-10-CM | POA: Diagnosis not present

## 2015-05-12 DIAGNOSIS — I482 Chronic atrial fibrillation: Secondary | ICD-10-CM | POA: Diagnosis not present

## 2015-05-12 DIAGNOSIS — I10 Essential (primary) hypertension: Secondary | ICD-10-CM | POA: Diagnosis not present

## 2015-05-12 DIAGNOSIS — Z7901 Long term (current) use of anticoagulants: Secondary | ICD-10-CM | POA: Diagnosis not present

## 2015-05-12 DIAGNOSIS — M1990 Unspecified osteoarthritis, unspecified site: Secondary | ICD-10-CM | POA: Diagnosis not present

## 2015-05-18 DIAGNOSIS — Z79899 Other long term (current) drug therapy: Secondary | ICD-10-CM | POA: Diagnosis not present

## 2015-05-18 DIAGNOSIS — D649 Anemia, unspecified: Secondary | ICD-10-CM | POA: Diagnosis not present

## 2015-05-18 DIAGNOSIS — E780 Pure hypercholesterolemia: Secondary | ICD-10-CM | POA: Diagnosis not present

## 2015-05-18 DIAGNOSIS — I10 Essential (primary) hypertension: Secondary | ICD-10-CM | POA: Diagnosis not present

## 2015-05-18 DIAGNOSIS — K850 Idiopathic acute pancreatitis: Secondary | ICD-10-CM | POA: Diagnosis not present

## 2015-05-27 DIAGNOSIS — K859 Acute pancreatitis, unspecified: Secondary | ICD-10-CM | POA: Diagnosis not present

## 2015-05-27 DIAGNOSIS — E785 Hyperlipidemia, unspecified: Secondary | ICD-10-CM | POA: Diagnosis not present

## 2015-05-27 DIAGNOSIS — I480 Paroxysmal atrial fibrillation: Secondary | ICD-10-CM | POA: Diagnosis not present

## 2015-05-27 DIAGNOSIS — I10 Essential (primary) hypertension: Secondary | ICD-10-CM | POA: Diagnosis not present

## 2015-06-02 ENCOUNTER — Encounter: Payer: Self-pay | Admitting: Gastroenterology

## 2015-06-16 ENCOUNTER — Encounter: Payer: Self-pay | Admitting: Gastroenterology

## 2015-06-16 ENCOUNTER — Ambulatory Visit (INDEPENDENT_AMBULATORY_CARE_PROVIDER_SITE_OTHER): Payer: Medicare Other | Admitting: Gastroenterology

## 2015-06-16 VITALS — BP 132/62 | HR 62 | Ht 60.0 in | Wt 144.8 lb

## 2015-06-16 DIAGNOSIS — K8500 Idiopathic acute pancreatitis without necrosis or infection: Secondary | ICD-10-CM

## 2015-06-16 NOTE — Patient Instructions (Signed)
We have requested you records from [place] and we will review  Please follow up with Dr.Jacobs on 08-18-15 at 9:45am
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
## 2015-06-16 NOTE — Progress Notes (Signed)
i agree with the above note, plan 

## 2015-06-19 ENCOUNTER — Telehealth: Payer: Self-pay | Admitting: Gastroenterology

## 2015-06-19 NOTE — Telephone Encounter (Signed)
I reviewed a packet of information from [facility] in [place]. Looks like a one-week admission from the end of June 2016 to the beginning of July 2016 admitting labs show white blood cell count 16,000 INR 2.0 lipase greater than 15,000, liver tests were all normal except slightly elevated AST at 66. IgG4 level was normal. Her triglycerides level was normal. Abdominal ultrasound suggested possible stone in the bile duct, gallbladder was normal without stones or sludge, she had trace perihepatic fluid. CT scan done the same day showed peripancreatic edema diffusely around the pancreas consistent with acute pancreatitis. Several small cysts in her kidneys. MRI with MRCP done 2 days later states that the common duct measured 10 mm and they were "no abnormal single holes noted within the common bile duct" no signs of choledocholithiasis.  The MRI report did not comment on presence or absence of pancreatic divisum.   Unclear etiology of her acute pancreatitis. The common bile duct slight dilation may have been from her acute inflammatory pancreatitis. Perhaps it is sign of other pathology. I look for to seeing her in the office when she returns for follow-up. Likely I will recommend repeat imaging. Probably noninvasive testing such as MRI with MRCP.

## 2015-07-01 DIAGNOSIS — I4891 Unspecified atrial fibrillation: Secondary | ICD-10-CM | POA: Diagnosis not present

## 2015-08-03 DIAGNOSIS — I480 Paroxysmal atrial fibrillation: Secondary | ICD-10-CM | POA: Diagnosis not present

## 2015-08-03 DIAGNOSIS — I10 Essential (primary) hypertension: Secondary | ICD-10-CM | POA: Diagnosis not present

## 2015-08-03 DIAGNOSIS — G4733 Obstructive sleep apnea (adult) (pediatric): Secondary | ICD-10-CM | POA: Diagnosis not present

## 2015-08-03 DIAGNOSIS — E785 Hyperlipidemia, unspecified: Secondary | ICD-10-CM | POA: Diagnosis not present

## 2015-08-18 ENCOUNTER — Encounter: Payer: Self-pay | Admitting: Gastroenterology

## 2015-08-18 ENCOUNTER — Other Ambulatory Visit (INDEPENDENT_AMBULATORY_CARE_PROVIDER_SITE_OTHER): Payer: Medicare Other

## 2015-08-18 ENCOUNTER — Ambulatory Visit (INDEPENDENT_AMBULATORY_CARE_PROVIDER_SITE_OTHER): Payer: Medicare Other | Admitting: Gastroenterology

## 2015-08-18 VITALS — BP 138/80 | HR 60 | Ht 60.0 in | Wt 147.2 lb

## 2015-08-18 DIAGNOSIS — K859 Acute pancreatitis without necrosis or infection, unspecified: Secondary | ICD-10-CM | POA: Diagnosis not present

## 2015-08-18 LAB — CBC WITH DIFFERENTIAL/PLATELET
BASOS ABS: 0.1 10*3/uL (ref 0.0–0.1)
BASOS PCT: 0.8 % (ref 0.0–3.0)
EOS PCT: 1.8 % (ref 0.0–5.0)
Eosinophils Absolute: 0.1 10*3/uL (ref 0.0–0.7)
HEMATOCRIT: 41.3 % (ref 36.0–46.0)
Hemoglobin: 13.8 g/dL (ref 12.0–15.0)
LYMPHS ABS: 1 10*3/uL (ref 0.7–4.0)
LYMPHS PCT: 13.3 % (ref 12.0–46.0)
MCHC: 33.3 g/dL (ref 30.0–36.0)
MCV: 88 fl (ref 78.0–100.0)
MONOS PCT: 9.8 % (ref 3.0–12.0)
Monocytes Absolute: 0.7 10*3/uL (ref 0.1–1.0)
NEUTROS ABS: 5.5 10*3/uL (ref 1.4–7.7)
NEUTROS PCT: 74.3 % (ref 43.0–77.0)
PLATELETS: 209 10*3/uL (ref 150.0–400.0)
RBC: 4.69 Mil/uL (ref 3.87–5.11)
RDW: 15.1 % (ref 11.5–15.5)
WBC: 7.4 10*3/uL (ref 4.0–10.5)

## 2015-08-18 LAB — TRIGLYCERIDES: Triglycerides: 126 mg/dL (ref 0.0–149.0)

## 2015-08-18 LAB — BUN: BUN: 19 mg/dL (ref 6–23)

## 2015-08-18 LAB — LIPASE: Lipase: 34 U/L (ref 11.0–59.0)

## 2015-08-18 LAB — AMYLASE: Amylase: 38 U/L (ref 27–131)

## 2015-08-18 LAB — CREATININE, SERUM: Creatinine, Ser: 0.94 mg/dL (ref 0.40–1.20)

## 2015-08-18 MED ORDER — DIAZEPAM 5 MG PO TABS: 5.0000 mg | ORAL_TABLET | ORAL | Status: DC
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
## 2015-08-18 NOTE — Patient Instructions (Addendum)
You will have labs checked today in the basement lab.  Please head down after you check out with the front desk  (serum triglycerides, cme, amylase and lipaset). MRI of abdomen with MRCP imaging (probably needs open MRI for claustrophobia).  You have been scheduled for an MRI at [place] on [address]. Your appointment time is 940 am. Please arrive 15 minutes prior to your appointment time for registration purposes. Please make certain not to have anything to eat or drink 6 hours prior to your test. In addition, if you have any metal in your body, have a pacemaker or defibrillator, please be sure to let your ordering physician know. This test typically takes 45 minutes to 1 hour to complete. Will also prescribe valium (5mg  pills, take one pill 1/2 hour before the MRI and another if needed, disp 2 pills).

## 2015-08-19 ENCOUNTER — Ambulatory Visit
Admission: RE | Admit: 2015-08-19 | Discharge: 2015-08-19 | Disposition: A | Discharge status: Home / Self Care | Payer: Medicare Other | Source: Ambulatory Visit | Attending: Gastroenterology | Admitting: Gastroenterology

## 2015-08-19 DIAGNOSIS — R935 Abnormal findings on diagnostic imaging of other abdominal regions, including retroperitoneum: Secondary | ICD-10-CM | POA: Diagnosis not present

## 2015-08-19 DIAGNOSIS — K861 Other chronic pancreatitis: Secondary | ICD-10-CM | POA: Diagnosis not present

## 2015-08-19 DIAGNOSIS — K859 Acute pancreatitis, unspecified: Secondary | ICD-10-CM

## 2015-08-19 MED ORDER — GADOBENATE DIMEGLUMINE 529 MG/ML IV SOLN
12.0000 mL | Freq: Once | INTRAVENOUS | Status: AC | PRN
  Administered 2015-08-19: 12 mL via INTRAVENOUS

## 2015-08-28 DIAGNOSIS — L650 Telogen effluvium: Secondary | ICD-10-CM | POA: Diagnosis not present

## 2015-08-28 DIAGNOSIS — L570 Actinic keratosis: Secondary | ICD-10-CM | POA: Diagnosis not present

## 2015-08-28 DIAGNOSIS — L648 Other androgenic alopecia: Secondary | ICD-10-CM | POA: Diagnosis not present

## 2015-08-31 DIAGNOSIS — H353232 Exudative age-related macular degeneration, bilateral, with inactive choroidal neovascularization: Secondary | ICD-10-CM | POA: Diagnosis not present

## 2015-08-31 DIAGNOSIS — Z23 Encounter for immunization: Secondary | ICD-10-CM | POA: Diagnosis not present

## 2015-08-31 DIAGNOSIS — H43813 Vitreous degeneration, bilateral: Secondary | ICD-10-CM | POA: Diagnosis not present

## 2015-08-31 DIAGNOSIS — H2513 Age-related nuclear cataract, bilateral: Secondary | ICD-10-CM | POA: Diagnosis not present

## 2015-10-09 DIAGNOSIS — G8929 Other chronic pain: Secondary | ICD-10-CM | POA: Diagnosis not present

## 2015-10-09 DIAGNOSIS — M1711 Unilateral primary osteoarthritis, right knee: Secondary | ICD-10-CM | POA: Diagnosis not present

## 2015-10-09 DIAGNOSIS — M25561 Pain in right knee: Secondary | ICD-10-CM | POA: Diagnosis not present

## 2015-11-09 DIAGNOSIS — Z01818 Encounter for other preprocedural examination: Secondary | ICD-10-CM | POA: Diagnosis not present

## 2015-11-09 DIAGNOSIS — M1711 Unilateral primary osteoarthritis, right knee: Secondary | ICD-10-CM | POA: Diagnosis not present
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
## 2015-11-24 ENCOUNTER — Encounter (HOSPITAL_COMMUNITY)
Admission: RE | Admit: 2015-11-24 | Discharge: 2015-11-24 | Disposition: A | Discharge status: Home / Self Care | Payer: Medicare Other | Source: Ambulatory Visit | Attending: Orthopedic Surgery | Admitting: Orthopedic Surgery

## 2015-11-24 ENCOUNTER — Encounter (HOSPITAL_COMMUNITY): Payer: Self-pay

## 2015-11-24 DIAGNOSIS — Z01812 Encounter for preprocedural laboratory examination: Secondary | ICD-10-CM | POA: Insufficient documentation

## 2015-11-24 DIAGNOSIS — I4891 Unspecified atrial fibrillation: Secondary | ICD-10-CM | POA: Insufficient documentation

## 2015-11-24 DIAGNOSIS — E785 Hyperlipidemia, unspecified: Secondary | ICD-10-CM | POA: Diagnosis not present

## 2015-11-24 DIAGNOSIS — I10 Essential (primary) hypertension: Secondary | ICD-10-CM | POA: Insufficient documentation

## 2015-11-24 DIAGNOSIS — I480 Paroxysmal atrial fibrillation: Secondary | ICD-10-CM | POA: Diagnosis not present

## 2015-11-24 DIAGNOSIS — E7800 Pure hypercholesterolemia, unspecified: Secondary | ICD-10-CM | POA: Diagnosis not present

## 2015-11-24 DIAGNOSIS — G4733 Obstructive sleep apnea (adult) (pediatric): Secondary | ICD-10-CM | POA: Diagnosis not present

## 2015-11-24 HISTORY — DX: Cardiac arrhythmia, unspecified: I49.9

## 2015-11-24 HISTORY — DX: Cardiac murmur, unspecified: R01.1

## 2015-11-24 HISTORY — DX: Reserved for inherently not codable concepts without codable children: IMO0001

## 2015-11-24 HISTORY — DX: Unspecified osteoarthritis, unspecified site: M19.90

## 2015-11-24 HISTORY — DX: Personal history of urinary calculi: Z87.442

## 2015-11-24 HISTORY — DX: Synovial cyst of popliteal space (Baker), unspecified knee: M71.20

## 2015-11-24 HISTORY — DX: Gastro-esophageal reflux disease without esophagitis: K21.9

## 2015-11-24 HISTORY — DX: Dizziness and giddiness: R42

## 2015-11-24 HISTORY — DX: Personal history of other diseases of the circulatory system: Z86.79

## 2015-11-24 HISTORY — DX: Malignant (primary) neoplasm, unspecified: C80.1

## 2015-11-24 HISTORY — DX: Localized edema: R60.0

## 2015-11-24 HISTORY — DX: Scoliosis, unspecified: M41.9

## 2015-11-24 HISTORY — DX: Unspecified hearing loss, unspecified ear: H91.90

## 2015-11-24 HISTORY — DX: Repeated falls: R29.6

## 2015-11-24 HISTORY — DX: Personal history of other diseases of the respiratory system: Z87.09

## 2015-11-24 LAB — URINALYSIS, ROUTINE W REFLEX MICROSCOPIC
BILIRUBIN URINE: NEGATIVE
Glucose, UA: NEGATIVE mg/dL
HGB URINE DIPSTICK: NEGATIVE
KETONES UR: NEGATIVE mg/dL
Leukocytes, UA: NEGATIVE
NITRITE: NEGATIVE
Protein, ur: NEGATIVE mg/dL
Specific Gravity, Urine: 1.01 (ref 1.005–1.030)
pH: 5.5 (ref 5.0–8.0)

## 2015-11-24 LAB — SURGICAL PCR SCREEN
MRSA, PCR: NEGATIVE
Staphylococcus aureus: NEGATIVE

## 2015-11-24 LAB — PROTIME-INR
INR: 1.24 (ref 0.00–1.49)
Prothrombin Time: 15.3 seconds — ABNORMAL HIGH (ref 11.6–15.2)

## 2015-11-24 LAB — ABO/RH: ABO/RH(D): O POS

## 2015-11-24 LAB — APTT: aPTT: 31 seconds (ref 24–37)

## 2015-11-24 NOTE — Patient Instructions (Addendum)
Elizabeth Everett  11/24/2015   Your procedure is scheduled on: Tuesday December 01, 2015   Report to [facility] Main  Entrance take [place]  elevators to 3rd floor to  [place] at 6:00 AM.  Call this number if you have problems the morning of surgery [phone]   Remember: ONLY 1 PERSON MAY GO WITH YOU TO SHORT STAY TO GET  READY MORNING OF Ord.  Do not eat food or drink liquids :After Midnight.     Take these medicines the morning of surgery with A SIP OF WATER: Amiodarone; Metoprolol               PATIENT TO STOP ELIQUIS 3 DAYS PRIOR TO SURGERY - LAST DOSE PRIOR TO SURGERY WILL BE ON Friday 11/27/2015                                You may not have any metal on your body including hair pins and              piercings  Do not wear jewelry, make-up, lotions, powders or perfumes, deodorant             Do not wear nail polish.  Do not shave  48 hours prior to surgery.                Do not bring valuables to the hospital. [place].  Contacts, dentures or bridgework may not be worn into surgery.  Leave suitcase in the car. After surgery it may be brought to your room.                Please read over the following fact sheets you were given:MRSA INFORMATION SHEET; INCENTIVE SPIROMETER; BLOOD TRANSFUSION INFORMATION SHEET  _____________________________________________________________________             [facility] - Preparing for Surgery Before surgery, you can play an important role.  Because skin is not sterile, your skin needs to be as free of germs as possible.  You can reduce the number of germs on your skin by washing with CHG (chlorahexidine gluconate) soap before surgery.  CHG is an antiseptic cleaner which kills germs and bonds with the skin to continue killing germs even after washing. Please DO NOT use if you have an allergy to CHG or antibacterial soaps.  If your skin becomes  reddened/irritated stop using the CHG and inform your nurse when you arrive at Short Stay. Do not shave (including legs and underarms) for at least 48 hours prior to the first CHG shower.  You may shave your face/neck. Please follow these instructions carefully:  1.  Shower with CHG Soap the night before surgery and the  morning of Surgery.  2.  If you choose to wash your hair, wash your hair first as usual with your  normal  shampoo.  3.  After you shampoo, rinse your hair and body thoroughly to remove the  shampoo.                           4.  Use CHG as you would any other liquid soap.  You can apply chg directly  to  the skin and wash                       Gently with a scrungie or clean washcloth.  5.  Apply the CHG Soap to your body ONLY FROM THE NECK DOWN.   Do not use on face/ open                           Wound or open sores. Avoid contact with eyes, ears mouth and genitals (private parts).                       Wash face,  Genitals (private parts) with your normal soap.             6.  Wash thoroughly, paying special attention to the area where your surgery  will be performed.  7.  Thoroughly rinse your body with warm water from the neck down.  8.  DO NOT shower/wash with your normal soap after using and rinsing off  the CHG Soap.                9.  Pat yourself dry with a clean towel.            10.  Wear clean pajamas.            11.  Place clean sheets on your bed the night of your first shower and do not  sleep with pets. Day of Surgery : Do not apply any lotions/deodorants the morning of surgery.  Please wear clean clothes to the hospital/surgery center.  FAILURE TO FOLLOW THESE INSTRUCTIONS MAY RESULT IN THE CANCELLATION OF YOUR SURGERY PATIENT SIGNATURE_________________________________  NURSE SIGNATURE__________________________________  ________________________________________________________________________   [name]  An incentive spirometer is a tool that  can help keep your lungs clear and active. This tool measures how well you are filling your lungs with each breath. Taking long deep breaths may help reverse or decrease the chance of developing breathing (pulmonary) problems (especially infection) following:  A long period of time when you are unable to move or be active. BEFORE THE PROCEDURE   If the spirometer includes an indicator to show your best effort, your nurse or respiratory therapist will set it to a desired goal.  If possible, sit up straight or lean slightly forward. Try not to slouch.  Hold the incentive spirometer in an upright position. INSTRUCTIONS FOR USE   Sit on the edge of your bed if possible, or sit up as far as you can in bed or on a chair.  Hold the incentive spirometer in an upright position.  Breathe out normally.  Place the mouthpiece in your mouth and seal your lips tightly around it.  Breathe in slowly and as deeply as possible, raising the piston or the ball toward the top of the column.  Hold your breath for 3-5 seconds or for as long as possible. Allow the piston or ball to fall to the bottom of the column.  Remove the mouthpiece from your mouth and breathe out normally.  Rest for a few seconds and repeat Steps 1 through 7 at least 10 times every 1-2 hours when you are awake. Take your time and take a few normal breaths between deep breaths.  The spirometer may include an indicator to show your best effort. Use the indicator as a goal to work toward during each repetition.  After each set  of 10 deep breaths, practice coughing to be sure your lungs are clear. If you have an incision (the cut made at the time of surgery), support your incision when coughing by placing a pillow or rolled up towels firmly against it. Once you are able to get out of bed, walk around indoors and cough well. You may stop using the incentive spirometer when instructed by your caregiver.  RISKS AND COMPLICATIONS  Take your  time so you do not get dizzy or light-headed.  If you are in pain, you may need to take or ask for pain medication before doing incentive spirometry. It is harder to take a deep breath if you are having pain. AFTER USE  Rest and breathe slowly and easily.  It can be helpful to keep track of a log of your progress. Your caregiver can provide you with a simple table to help with this. If you are using the spirometer at home, follow these instructions: [place] IF:   You are having difficultly using the spirometer.  You have trouble using the spirometer as often as instructed.  Your pain medication is not giving enough relief while using the spirometer.  You develop fever of 100.5 F (38.1 C) or higher. SEEK IMMEDIATE MEDICAL CARE IF:   You cough up bloody sputum that had not been present before.  You develop fever of 102 F (38.9 C) or greater.  You develop worsening pain at or near the incision site. MAKE SURE YOU:   Understand these instructions.  Will watch your condition.  Will get help right away if you are not doing well or get worse. Document Released: 02/27/2007 Document Revised: 01/09/2012 Document Reviewed: 04/30/2007 ExitCare Patient Information 2014 Exit[employer], [place].   ________________________________________________________________________  WHAT IS A BLOOD TRANSFUSION? Blood Transfusion Information  A transfusion is the replacement of blood or some of its parts. Blood is made up of multiple cells which provide different functions.  Red blood cells carry oxygen and are used for blood loss replacement.  White blood cells fight against infection.  Platelets control bleeding.  Plasma helps clot blood.  Other blood products are available for specialized needs, such as hemophilia or other clotting disorders. BEFORE THE TRANSFUSION  Who gives blood for transfusions?   Healthy volunteers who are fully evaluated to make sure their blood is safe. This  is blood bank blood. Transfusion therapy is the safest it has ever been in the practice of medicine. Before blood is taken from a donor, a complete history is taken to make sure that person has no history of diseases nor engages in risky social behavior (examples are intravenous drug use or sexual activity with multiple partners). The donor's travel history is screened to minimize risk of transmitting infections, such as malaria. The donated blood is tested for signs of infectious diseases, such as HIV and hepatitis. The blood is then tested to be sure it is compatible with you in order to minimize the chance of a transfusion reaction. If you or a relative donates blood, this is often done in anticipation of surgery and is not appropriate for emergency situations. It takes many days to process the donated blood. RISKS AND COMPLICATIONS Although transfusion therapy is very safe and saves many lives, the main dangers of transfusion include:   Getting an infectious disease.  Developing a transfusion reaction. This is an allergic reaction to something in the blood you were given. Every precaution is taken to prevent this. The decision to have a  blood transfusion has been considered carefully by your caregiver before blood is given. Blood is not given unless the benefits outweigh the risks. AFTER THE TRANSFUSION  Right after receiving a blood transfusion, you will usually feel much better and more energetic. This is especially true if your red blood cells have gotten low (anemic). The transfusion raises the level of the red blood cells which carry oxygen, and this usually causes an energy increase.  The nurse administering the transfusion will monitor you carefully for complications. HOME CARE INSTRUCTIONS  No special instructions are needed after a transfusion. You may find your energy is better. Speak with your caregiver about any limitations on activity for underlying diseases you may have. SEEK  MEDICAL CARE IF:   Your condition is not improving after your transfusion.  You develop redness or irritation at the intravenous (IV) site. SEEK IMMEDIATE MEDICAL CARE IF:  Any of the following symptoms occur over the next 12 hours:  Shaking chills.  You have a temperature by mouth above 102 F (38.9 C), not controlled by medicine.  Chest, back, or muscle pain.  People around you feel you are not acting correctly or are confused.  Shortness of breath or difficulty breathing.  Dizziness and fainting.  You get a rash or develop hives.  You have a decrease in urine output.  Your urine turns a dark color or changes to pink, red, or brown. Any of the following symptoms occur over the next 10 days:  You have a temperature by mouth above 102 F (38.9 C), not controlled by medicine.  Shortness of breath.  Weakness after normal activity.  The white part of the eye turns yellow (jaundice).  You have a decrease in the amount of urine or are urinating less often.  Your urine turns a dark color or changes to pink, red, or brown. Document Released: 10/14/2000 Document Revised: 01/09/2012 Document Reviewed: 06/02/2008 [facility] Patient Information 2014 [place], [place].  _______________________________________________________________________

## 2015-11-25 ENCOUNTER — Encounter (HOSPITAL_COMMUNITY): Payer: Self-pay

## 2015-11-25 NOTE — Progress Notes (Addendum)
Clearance note per chart per attending physician 11/09/2015 with cardiology clearance  CBC,Platelets, and Aut Diff results per chart 11/09/2015 also CMP results per chart 11/09/2015  Clearance note per chart per Dr Ola Spurr 11/24/2015  H&P per chart per [facility] 11/09/2015  EKG per chart 11/24/2015

## 2015-12-01 ENCOUNTER — Inpatient Hospital Stay (HOSPITAL_COMMUNITY): Payer: Medicare Other | Admitting: Certified Registered Nurse Anesthetist

## 2015-12-01 ENCOUNTER — Encounter (HOSPITAL_COMMUNITY)
Admission: RE | Disposition: A | Discharge status: Home Health | Payer: Self-pay | Source: Ambulatory Visit | Attending: Orthopedic Surgery

## 2015-12-01 ENCOUNTER — Encounter (HOSPITAL_COMMUNITY): Payer: Self-pay | Admitting: *Deleted

## 2015-12-01 ENCOUNTER — Inpatient Hospital Stay (HOSPITAL_COMMUNITY)
Admission: RE | Admit: 2015-12-01 | Discharge: 2015-12-02 | DRG: 470 | Disposition: A | Discharge status: Home Health | Payer: Medicare Other | Source: Ambulatory Visit | Attending: Orthopedic Surgery | Admitting: Orthopedic Surgery

## 2015-12-01 DIAGNOSIS — H9190 Unspecified hearing loss, unspecified ear: Secondary | ICD-10-CM | POA: Diagnosis present

## 2015-12-01 DIAGNOSIS — M659 Synovitis and tenosynovitis, unspecified: Secondary | ICD-10-CM | POA: Diagnosis present

## 2015-12-01 DIAGNOSIS — K219 Gastro-esophageal reflux disease without esophagitis: Secondary | ICD-10-CM | POA: Diagnosis present

## 2015-12-01 DIAGNOSIS — Z01812 Encounter for preprocedural laboratory examination: Secondary | ICD-10-CM

## 2015-12-01 DIAGNOSIS — E7800 Pure hypercholesterolemia, unspecified: Secondary | ICD-10-CM | POA: Diagnosis present

## 2015-12-01 DIAGNOSIS — H3530 Unspecified macular degeneration: Secondary | ICD-10-CM | POA: Diagnosis present

## 2015-12-01 DIAGNOSIS — M179 Osteoarthritis of knee, unspecified: Secondary | ICD-10-CM | POA: Diagnosis not present

## 2015-12-01 DIAGNOSIS — M25561 Pain in right knee: Secondary | ICD-10-CM | POA: Diagnosis not present

## 2015-12-01 DIAGNOSIS — I4891 Unspecified atrial fibrillation: Secondary | ICD-10-CM | POA: Diagnosis present

## 2015-12-01 DIAGNOSIS — Z96659 Presence of unspecified artificial knee joint: Secondary | ICD-10-CM

## 2015-12-01 DIAGNOSIS — I10 Essential (primary) hypertension: Secondary | ICD-10-CM | POA: Diagnosis present

## 2015-12-01 DIAGNOSIS — M1711 Unilateral primary osteoarthritis, right knee: Principal | ICD-10-CM | POA: Diagnosis present

## 2015-12-01 HISTORY — PX: TOTAL KNEE ARTHROPLASTY: SHX125

## 2015-12-01 LAB — TYPE AND SCREEN
ABO/RH(D): O POS
ANTIBODY SCREEN: NEGATIVE

## 2015-12-01 LAB — PROTIME-INR
INR: 1.07 (ref 0.00–1.49)
Prothrombin Time: 13.7 seconds (ref 11.6–15.2)

## 2015-12-01 SURGERY — ARTHROPLASTY, KNEE, TOTAL
Anesthesia: Monitor Anesthesia Care | Site: Knee | Laterality: Right

## 2015-12-01 MED ORDER — ONDANSETRON HCL 4 MG/2ML IJ SOLN
INTRAMUSCULAR | Status: AC
  Filled 2015-12-01: qty 2

## 2015-12-01 MED ORDER — OXYCODONE HCL 5 MG PO TABS: 5.0000 mg | ORAL_TABLET | Freq: Once | ORAL | Status: DC | PRN

## 2015-12-01 MED ORDER — ONDANSETRON HCL 4 MG/2ML IJ SOLN
4.0000 mg | Freq: Four times a day (QID) | INTRAMUSCULAR | Status: AC | PRN
  Administered 2015-12-01: 4 mg via INTRAVENOUS

## 2015-12-01 MED ORDER — SODIUM CHLORIDE 0.9 % IJ SOLN
INTRAMUSCULAR | Status: AC
  Filled 2015-12-01: qty 50

## 2015-12-01 MED ORDER — FERROUS SULFATE 325 (65 FE) MG PO TABS
325.0000 mg | ORAL_TABLET | Freq: Three times a day (TID) | ORAL | Status: DC
  Administered 2015-12-02 (×2): 325 mg via ORAL
  Filled 2015-12-01 (×6): qty 1

## 2015-12-01 MED ORDER — SIMVASTATIN 20 MG PO TABS
20.0000 mg | ORAL_TABLET | Freq: Every day | ORAL | Status: DC
  Administered 2015-12-01 – 2015-12-02 (×2): 20 mg via ORAL
  Filled 2015-12-01 (×2): qty 1

## 2015-12-01 MED ORDER — PHENOL 1.4 % MT LIQD
1.0000 | OROMUCOSAL | Status: DC | PRN
  Filled 2015-12-01: qty 177

## 2015-12-01 MED ORDER — CEFAZOLIN SODIUM-DEXTROSE 2-3 GM-% IV SOLR
INTRAVENOUS | Status: AC
  Filled 2015-12-01: qty 50

## 2015-12-01 MED ORDER — CEFAZOLIN SODIUM-DEXTROSE 2-3 GM-% IV SOLR
2.0000 g | Freq: Four times a day (QID) | INTRAVENOUS | Status: AC
  Administered 2015-12-01 (×2): 2 g via INTRAVENOUS
  Filled 2015-12-01 (×2): qty 50

## 2015-12-01 MED ORDER — SODIUM CHLORIDE 0.9 % IV SOLN
INTRAVENOUS | Status: DC
  Administered 2015-12-01: 14:00:00 via INTRAVENOUS
  Filled 2015-12-01 (×5): qty 1000

## 2015-12-01 MED ORDER — BUPIVACAINE-EPINEPHRINE (PF) 0.25% -1:200000 IJ SOLN
INTRAMUSCULAR | Status: DC | PRN
  Administered 2015-12-01: 30 mL

## 2015-12-01 MED ORDER — OXYCODONE HCL 5 MG/5ML PO SOLN
5.0000 mg | Freq: Once | ORAL | Status: DC | PRN
  Filled 2015-12-01: qty 5

## 2015-12-01 MED ORDER — KETOROLAC TROMETHAMINE 30 MG/ML IJ SOLN
INTRAMUSCULAR | Status: AC
  Filled 2015-12-01: qty 1

## 2015-12-01 MED ORDER — ALUM & MAG HYDROXIDE-SIMETH 200-200-20 MG/5ML PO SUSP: 30.0000 mL | ORAL | Status: DC | PRN

## 2015-12-01 MED ORDER — DEXAMETHASONE SODIUM PHOSPHATE 10 MG/ML IJ SOLN
10.0000 mg | Freq: Once | INTRAMUSCULAR | Status: AC
  Administered 2015-12-01: 10 mg via INTRAVENOUS

## 2015-12-01 MED ORDER — ONDANSETRON HCL 4 MG/2ML IJ SOLN: 4.0000 mg | Freq: Four times a day (QID) | INTRAMUSCULAR | Status: DC | PRN

## 2015-12-01 MED ORDER — METOCLOPRAMIDE HCL 5 MG PO TABS
5.0000 mg | ORAL_TABLET | Freq: Three times a day (TID) | ORAL | Status: DC | PRN
  Filled 2015-12-01: qty 2

## 2015-12-01 MED ORDER — CEFAZOLIN SODIUM-DEXTROSE 2-3 GM-% IV SOLR
2.0000 g | INTRAVENOUS | Status: AC
  Administered 2015-12-01: 2 g via INTRAVENOUS

## 2015-12-01 MED ORDER — ONDANSETRON HCL 4 MG PO TABS: 4.0000 mg | ORAL_TABLET | Freq: Four times a day (QID) | ORAL | Status: DC | PRN

## 2015-12-01 MED ORDER — FENTANYL CITRATE (PF) 100 MCG/2ML IJ SOLN
INTRAMUSCULAR | Status: AC
  Filled 2015-12-01: qty 2

## 2015-12-01 MED ORDER — HYDROCODONE-ACETAMINOPHEN 7.5-325 MG PO TABS
1.0000 | ORAL_TABLET | ORAL | Status: DC
  Administered 2015-12-01 – 2015-12-02 (×7): 1 via ORAL
  Filled 2015-12-01 (×7): qty 1

## 2015-12-01 MED ORDER — BUPIVACAINE HCL (PF) 0.75 % IJ SOLN
INTRAMUSCULAR | Status: DC | PRN
  Administered 2015-12-01: 1.2 mL via INTRATHECAL

## 2015-12-01 MED ORDER — POLYETHYLENE GLYCOL 3350 17 G PO PACK
17.0000 g | PACK | Freq: Two times a day (BID) | ORAL | Status: DC
  Administered 2015-12-01: 17 g via ORAL

## 2015-12-01 MED ORDER — PROPOFOL 500 MG/50ML IV EMUL: INTRAVENOUS | Status: DC | PRN

## 2015-12-01 MED ORDER — SODIUM CHLORIDE 0.9 % IR SOLN
Status: DC | PRN
  Administered 2015-12-01: 1000 mL

## 2015-12-01 MED ORDER — METOPROLOL TARTRATE 25 MG PO TABS
25.0000 mg | ORAL_TABLET | Freq: Two times a day (BID) | ORAL | Status: DC
  Administered 2015-12-01 – 2015-12-02 (×2): 25 mg via ORAL
  Filled 2015-12-01 (×3): qty 1

## 2015-12-01 MED ORDER — DIPHENHYDRAMINE HCL 25 MG PO CAPS: 25.0000 mg | ORAL_CAPSULE | Freq: Four times a day (QID) | ORAL | Status: DC | PRN

## 2015-12-01 MED ORDER — BUPIVACAINE-EPINEPHRINE (PF) 0.25% -1:200000 IJ SOLN
INTRAMUSCULAR | Status: AC
  Filled 2015-12-01: qty 30

## 2015-12-01 MED ORDER — STERILE WATER FOR IRRIGATION IR SOLN
Status: DC | PRN
  Administered 2015-12-01: 2000 mL

## 2015-12-01 MED ORDER — MENTHOL 3 MG MT LOZG: 1.0000 | LOZENGE | OROMUCOSAL | Status: DC | PRN

## 2015-12-01 MED ORDER — SODIUM CHLORIDE 0.9 % IJ SOLN
INTRAMUSCULAR | Status: DC | PRN
  Administered 2015-12-01: 30 mL via INTRAVENOUS

## 2015-12-01 MED ORDER — PROPOFOL 500 MG/50ML IV EMUL
INTRAVENOUS | Status: DC | PRN
  Administered 2015-12-01: 50 ug/kg/min via INTRAVENOUS

## 2015-12-01 MED ORDER — DOCUSATE SODIUM 100 MG PO CAPS
100.0000 mg | ORAL_CAPSULE | Freq: Two times a day (BID) | ORAL | Status: DC
  Administered 2015-12-01 – 2015-12-02 (×2): 100 mg via ORAL

## 2015-12-01 MED ORDER — KETOROLAC TROMETHAMINE 30 MG/ML IJ SOLN
INTRAMUSCULAR | Status: DC | PRN
  Administered 2015-12-01: 30 mg

## 2015-12-01 MED ORDER — IRBESARTAN 75 MG PO TABS
75.0000 mg | ORAL_TABLET | Freq: Every day | ORAL | Status: DC
  Administered 2015-12-01: 75 mg via ORAL
  Filled 2015-12-01 (×2): qty 1

## 2015-12-01 MED ORDER — MAGNESIUM CITRATE PO SOLN: 1.0000 | Freq: Once | ORAL | Status: DC | PRN

## 2015-12-01 MED ORDER — CHLORHEXIDINE GLUCONATE 4 % EX LIQD: 60.0000 mL | Freq: Once | CUTANEOUS | Status: DC

## 2015-12-01 MED ORDER — PROPOFOL 10 MG/ML IV BOLUS
INTRAVENOUS | Status: AC
  Filled 2015-12-01: qty 40

## 2015-12-01 MED ORDER — DEXAMETHASONE SODIUM PHOSPHATE 10 MG/ML IJ SOLN
10.0000 mg | Freq: Once | INTRAMUSCULAR | Status: AC
  Administered 2015-12-02: 10 mg via INTRAVENOUS
  Filled 2015-12-01: qty 1

## 2015-12-01 MED ORDER — METHOCARBAMOL 1000 MG/10ML IJ SOLN
500.0000 mg | Freq: Four times a day (QID) | INTRAVENOUS | Status: DC | PRN
  Administered 2015-12-01: 500 mg via INTRAVENOUS
  Filled 2015-12-01 (×2): qty 5

## 2015-12-01 MED ORDER — LACTATED RINGERS IV SOLN
INTRAVENOUS | Status: DC
  Administered 2015-12-01: 1000 mL via INTRAVENOUS
  Administered 2015-12-01: 10:00:00 via INTRAVENOUS

## 2015-12-01 MED ORDER — HYDRALAZINE HCL 20 MG/ML IJ SOLN: 10.0000 mg | Freq: Once | INTRAMUSCULAR | Status: DC

## 2015-12-01 MED ORDER — TRANEXAMIC ACID 1000 MG/10ML IV SOLN
1000.0000 mg | Freq: Once | INTRAVENOUS | Status: AC
  Administered 2015-12-01: 1000 mg via INTRAVENOUS
  Filled 2015-12-01: qty 10

## 2015-12-01 MED ORDER — FENTANYL CITRATE (PF) 100 MCG/2ML IJ SOLN
25.0000 ug | INTRAMUSCULAR | Status: DC | PRN
  Administered 2015-12-01 (×3): 50 ug via INTRAVENOUS

## 2015-12-01 MED ORDER — AMIODARONE HCL 200 MG PO TABS
200.0000 mg | ORAL_TABLET | Freq: Every day | ORAL | Status: DC
  Administered 2015-12-02: 200 mg via ORAL
  Filled 2015-12-01: qty 1

## 2015-12-01 MED ORDER — APIXABAN 2.5 MG PO TABS
2.5000 mg | ORAL_TABLET | Freq: Two times a day (BID) | ORAL | Status: DC
  Administered 2015-12-02: 2.5 mg via ORAL
  Filled 2015-12-01 (×3): qty 1

## 2015-12-01 MED ORDER — POTASSIUM CHLORIDE ER 10 MEQ PO TBCR
10.0000 meq | EXTENDED_RELEASE_TABLET | ORAL | Status: DC
  Administered 2015-12-02: 10 meq via ORAL
  Filled 2015-12-01: qty 1

## 2015-12-01 MED ORDER — FUROSEMIDE 20 MG PO TABS
20.0000 mg | ORAL_TABLET | ORAL | Status: DC
  Filled 2015-12-01: qty 1

## 2015-12-01 MED ORDER — 0.9 % SODIUM CHLORIDE (POUR BTL) OPTIME
TOPICAL | Status: DC | PRN
  Administered 2015-12-01: 1000 mL

## 2015-12-01 MED ORDER — KETOROLAC TROMETHAMINE 15 MG/ML IJ SOLN
15.0000 mg | Freq: Four times a day (QID) | INTRAMUSCULAR | Status: DC
Start: 2015-12-01 — End: 2015-12-02
  Administered 2015-12-01 – 2015-12-02 (×4): 15 mg via INTRAVENOUS
  Filled 2015-12-01 (×7): qty 1

## 2015-12-01 MED ORDER — PANTOPRAZOLE SODIUM 40 MG PO TBEC
40.0000 mg | DELAYED_RELEASE_TABLET | Freq: Every day | ORAL | Status: DC
  Administered 2015-12-01: 40 mg via ORAL
  Filled 2015-12-01 (×2): qty 1

## 2015-12-01 MED ORDER — METOCLOPRAMIDE HCL 5 MG/ML IJ SOLN: 5.0000 mg | Freq: Three times a day (TID) | INTRAMUSCULAR | Status: DC | PRN

## 2015-12-01 MED ORDER — FENTANYL CITRATE (PF) 100 MCG/2ML IJ SOLN
INTRAMUSCULAR | Status: DC | PRN
  Administered 2015-12-01: 100 ug via INTRAVENOUS

## 2015-12-01 MED ORDER — METHOCARBAMOL 500 MG PO TABS
500.0000 mg | ORAL_TABLET | Freq: Four times a day (QID) | ORAL | Status: DC | PRN
  Administered 2015-12-01: 500 mg via ORAL
  Filled 2015-12-01: qty 1

## 2015-12-01 MED ORDER — PROPOFOL 10 MG/ML IV BOLUS
INTRAVENOUS | Status: AC
  Filled 2015-12-01: qty 20

## 2015-12-01 MED ORDER — BISACODYL 10 MG RE SUPP: 10.0000 mg | Freq: Every day | RECTAL | Status: DC | PRN

## 2015-12-01 SURGICAL SUPPLY — 42 items
BANDAGE ACE 6X5 VEL STRL LF (GAUZE/BANDAGES/DRESSINGS) ×3 IMPLANT
BLADE SAW SGTL 13.0X1.19X90.0M (BLADE) ×3 IMPLANT
BOWL SMART MIX CTS (DISPOSABLE) ×3 IMPLANT
CAPT KNEE TOTAL 3 ATTUNE ×2 IMPLANT
CEMENT HV SMART SET (Cement) ×4 IMPLANT
CLOTH BEACON ORANGE TIMEOUT ST (SAFETY) ×3 IMPLANT
CUFF TOURN SGL QUICK 34 (TOURNIQUET CUFF) ×3
CUFF TRNQT CYL 34X4X40X1 (TOURNIQUET CUFF) ×1 IMPLANT
DECANTER SPIKE VIAL GLASS SM (MISCELLANEOUS) ×3 IMPLANT
DRAPE U-SHAPE 47X51 STRL (DRAPES) ×3 IMPLANT
DRSG AQUACEL AG ADV 3.5X10 (GAUZE/BANDAGES/DRESSINGS) ×3 IMPLANT
DURAPREP 26ML APPLICATOR (WOUND CARE) ×6 IMPLANT
ELECT REM PT RETURN 9FT ADLT (ELECTROSURGICAL) ×3
ELECTRODE REM PT RTRN 9FT ADLT (ELECTROSURGICAL) ×1 IMPLANT
GLOVE BIOGEL M 7.0 STRL (GLOVE) ×3 IMPLANT
GLOVE BIOGEL PI IND STRL 7.0 (GLOVE) IMPLANT
GLOVE BIOGEL PI IND STRL 7.5 (GLOVE) ×2 IMPLANT
GLOVE BIOGEL PI IND STRL 8 (GLOVE) ×2 IMPLANT
GLOVE BIOGEL PI INDICATOR 7.0 (GLOVE) ×2
GLOVE BIOGEL PI INDICATOR 7.5 (GLOVE) ×4
GLOVE BIOGEL PI INDICATOR 8 (GLOVE) ×4
GLOVE ORTHO TXT STRL SZ7.5 (GLOVE) ×6 IMPLANT
GLOVE SURG SS PI 7.0 STRL IVOR (GLOVE) ×2 IMPLANT
GOWN STRL REUS W/TWL LRG LVL3 (GOWN DISPOSABLE) ×5 IMPLANT
GOWN STRL REUS W/TWL XL LVL3 (GOWN DISPOSABLE) ×6 IMPLANT
HANDPIECE INTERPULSE COAX TIP (DISPOSABLE) ×3
LIQUID BAND (GAUZE/BANDAGES/DRESSINGS) ×3 IMPLANT
MANIFOLD NEPTUNE II (INSTRUMENTS) ×3 IMPLANT
PACK TOTAL KNEE CUSTOM (KITS) ×3 IMPLANT
POSITIONER SURGICAL ARM (MISCELLANEOUS) ×3 IMPLANT
SET HNDPC FAN SPRY TIP SCT (DISPOSABLE) ×1 IMPLANT
SET PAD KNEE POSITIONER (MISCELLANEOUS) ×3 IMPLANT
SUCTION FRAZIER 12FR DISP (SUCTIONS) ×3 IMPLANT
SUT MNCRL AB 4-0 PS2 18 (SUTURE) ×3 IMPLANT
SUT VIC AB 1 CT1 36 (SUTURE) ×3 IMPLANT
SUT VIC AB 2-0 CT1 27 (SUTURE) ×9
SUT VIC AB 2-0 CT1 TAPERPNT 27 (SUTURE) ×3 IMPLANT
SUT VLOC 180 0 24IN GS25 (SUTURE) ×3 IMPLANT
TRAY FOLEY W/METER SILVER 14FR (SET/KITS/TRAYS/PACK) ×3 IMPLANT
WATER STERILE IRR 1500ML POUR (IV SOLUTION) ×3 IMPLANT
WRAP KNEE MAXI GEL POST OP (GAUZE/BANDAGES/DRESSINGS) ×3 IMPLANT
YANKAUER SUCT BULB TIP 10FT TU (MISCELLANEOUS) ×3 IMPLANT

## 2015-12-01 NOTE — Anesthesia Preprocedure Evaluation (Signed)
Anesthesia Evaluation  Patient identified by MRN, date of birth, ID band Patient awake    Reviewed: Allergy & Precautions, NPO status , Patient's Chart, lab work & pertinent test results  Airway Mallampati: II   Neck ROM: full    Dental   Pulmonary shortness of breath,    breath sounds clear to auscultation       Cardiovascular hypertension, + dysrhythmias Atrial Fibrillation  Rhythm:regular Rate:Normal     Neuro/Psych    GI/Hepatic GERD  ,  Endo/Other    Renal/GU      Musculoskeletal  (+) Arthritis ,   Abdominal   Peds  Hematology   Anesthesia Other Findings   Reproductive/Obstetrics                             Anesthesia Physical Anesthesia Plan  ASA: III  Anesthesia Plan: MAC and Spinal   Post-op Pain Management:    Induction: Intravenous  Airway Management Planned: Simple Face Mask  Additional Equipment:   Intra-op Plan:   Post-operative Plan:   Informed Consent: I have reviewed the patients History and Physical, chart, labs and discussed the procedure including the risks, benefits and alternatives for the proposed anesthesia with the patient or authorized representative who has indicated his/her understanding and acceptance.     Plan Discussed with: CRNA, Anesthesiologist and Surgeon  Anesthesia Plan Comments:         Anesthesia Quick Evaluation

## 2015-12-01 NOTE — Progress Notes (Signed)
Utilization review completed.  

## 2015-12-01 NOTE — Anesthesia Procedure Notes (Signed)
Spinal Patient location during procedure: OR Start time: 12/01/2015 8:57 AM End time: 12/01/2015 9:02 AM Staffing Resident/CRNA: Gean Maidens Performed by: resident/CRNA  Preanesthetic Checklist Completed: patient identified, site marked, surgical consent, pre-op evaluation, timeout performed, IV checked, risks and benefits discussed and monitors and equipment checked Spinal Block Patient position: sitting Prep: Betadine Patient monitoring: heart rate, continuous pulse ox and blood pressure Approach: midline Location: L3-4 Injection technique: single-shot Needle Needle type: Spinocan  Needle gauge: 22 G Needle length: 9 cm Needle insertion depth: 7 cm

## 2015-12-01 NOTE — Evaluation (Signed)
Physical Therapy Evaluation Patient Details Name: Elizabeth Everett MRN: [id number] DOB: Mar 02, 1932 Today's Date: 12/01/2015   History of Present Illness  s/p R TKA  Clinical Impression  Pt is s/p TKA resulting in the deficits listed below (see PT Problem List).   Pt will benefit from skilled PT to increase their independence and safety with mobility to allow discharge to the venue listed below.  Plan is for home with HHPT, supportive husband and daughter     Follow Up Recommendations Home health PT;Supervision for mobility/OOB    Equipment Recommendations  None recommended by PT    Recommendations for Other Services       Precautions / Restrictions Precautions Precautions: Knee Restrictions Weight Bearing Restrictions: No Other Position/Activity Restrictions: WBAT      Mobility  Bed Mobility Overal bed mobility: Needs Assistance Bed Mobility: Supine to Sit     Supine to sit: Min guard     General bed mobility comments: for safety  Transfers Overall transfer level: Needs assistance Equipment used: Rolling walker (2 wheeled) Transfers: Sit to/from Stand Sit to Stand: Min assist         General transfer comment: cues for hand placement and RLE position  Ambulation/Gait Ambulation/Gait assistance: Min assist Ambulation Distance (Feet): 30 Feet Assistive device: Rolling walker (2 wheeled) Gait Pattern/deviations: Step-to pattern     General Gait Details: cues for RW safety and sequence, difficulty getting heel to floor in stance  Stairs            Wheelchair Mobility    Modified Rankin (Stroke Patients Only)       Balance                                             Pertinent Vitals/Pain Pain Assessment: No/denies pain Pain Score: 0-No pain Pain Location: R knee Pain Intervention(s): Limited activity within patient's tolerance;Premedicated before session;Monitored during session    Home Living Family/patient expects to be  discharged to:: Private residence Living Arrangements: Spouse/significant other   Type of Home: House Home Access: Level entry     Home Layout: One level Home Equipment: Environmental consultant - 2 wheels;Bedside commode;Cane - single point Additional Comments: cane occasionally for better balance    Prior Function Level of Independence: Independent;Independent with assistive device(s)               Hand Dominance        Extremity/Trunk Assessment   Upper Extremity Assessment: Defer to OT evaluation           Lower Extremity Assessment: RLE deficits/detail RLE Deficits / Details: ankle WFL, knee extension and hip flexion 3/5       Communication   Communication: No difficulties  Cognition Arousal/Alertness: Awake/alert Behavior During Therapy: WFL for tasks assessed/performed Overall Cognitive Status: Within Functional Limits for tasks assessed                      General Comments      Exercises Total Joint Exercises Ankle Circles/Pumps: AROM;Both;15 reps Quad Sets: 15 reps;Both;AROM;Strengthening      Assessment/Plan    PT Assessment Patient needs continued PT services  PT Diagnosis Difficulty walking   PT Problem List Decreased strength;Decreased range of motion;Decreased activity tolerance;Decreased balance;Decreased mobility;Decreased safety awareness;Decreased knowledge of use of DME  PT Treatment Interventions DME instruction;Gait training;Functional mobility training;Therapeutic activities;Therapeutic exercise;Patient/family education  PT Goals (Current goals can be found in the Care Plan section) Acute Rehab PT Goals Patient Stated Goal: home tomorrow, back to active lifestyle soon PT Goal Formulation: With patient Time For Goal Achievement: 12/03/15 Potential to Achieve Goals: Good    Frequency 7X/week   Barriers to discharge        Co-evaluation               End of Session Equipment Utilized During Treatment: Gait belt Activity  Tolerance: Patient tolerated treatment well Patient left: in chair;with call bell/phone within reach;with chair alarm set Nurse Communication: Mobility status         Time: [id number] PT Time Calculation (min) (ACUTE ONLY): 23 min   Charges:   PT Evaluation $PT Eval Low Complexity: 1 Procedure PT Treatments $Gait Training: 8-22 mins   PT G Codes:        Shenicka Sunderlin 12-07-15, 4:41 PM

## 2015-12-01 NOTE — Anesthesia Postprocedure Evaluation (Signed)
Anesthesia Post Note  Patient: Elizabeth Everett  Procedure(s) Performed: Procedure(s) (LRB): RIGHT TOTAL KNEE ARTHROPLASTY (Right)  Patient location during evaluation: PACU Anesthesia Type: Spinal Level of consciousness: oriented and awake and alert Pain management: pain level controlled Vital Signs Assessment: post-procedure vital signs reviewed and stable Respiratory status: spontaneous breathing, respiratory function stable and patient connected to nasal cannula oxygen Cardiovascular status: blood pressure returned to baseline and stable Postop Assessment: no headache and no backache Anesthetic complications: no    Last Vitals:  Filed Vitals:   12/01/15 1215 12/01/15 1314  BP: 167/64 172/61  Pulse:  55  Temp: 36.4 C 36.5 C  Resp: 16 15    Last Pain:  Filed Vitals:   12/01/15 1319  PainSc: [place]

## 2015-12-01 NOTE — Op Note (Signed)
NAME:  Elizabeth Everett                      MEDICAL RECORD NO.:  [id number]                             FACILITY:  [facility]      PHYSICIAN:  Pietro Cassis. Alvan Dame, M.D.  DATE OF BIRTH:  Sep 04, 1932      DATE OF PROCEDURE:  12/01/2015                                     OPERATIVE REPORT         PREOPERATIVE DIAGNOSIS:  Right knee osteoarthritis.      POSTOPERATIVE DIAGNOSIS:  Right knee osteoarthritis.      FINDINGS:  The patient was noted to have complete loss of cartilage and   bone-on-bone arthritis with associated osteophytes in the lateral and patellofemoral compartments of   the knee with a significant synovitis and associated effusion.      PROCEDURE:  Right total knee replacement.      COMPONENTS USED:  DePuy Attune rotating platform posterior stabilized knee   system, a size 4 femur, 3 tibia, size 6 mm PS AOX insert, and 32 anatomic patellar   button.      SURGEON:  Pietro Cassis. Alvan Dame, M.D.      ASSISTANT:  Nehemiah Massed, PA-C.      ANESTHESIA:  Spinal.      SPECIMENS:  None.      COMPLICATION:  None.      DRAINS:  None  EBL: <50cc      TOURNIQUET TIME:   Total Tourniquet Time Documented: Thigh (Right) - 26 minutes Total: Thigh (Right) - 26 minutes  .      The patient was stable to the recovery room.      INDICATION FOR PROCEDURE:  Elizabeth Everett is a 82 y.o. female patient of   mine.  The patient had been seen, evaluated, and treated conservatively in the   office with medication, activity modification, and injections.  The patient had   radiographic changes of bone-on-bone arthritis with endplate sclerosis and osteophytes noted.      The patient failed conservative measures including medication, injections, and activity modification, and at this point was ready for more definitive measures.   Based on the radiographic changes and failed conservative measures, the patient   decided to proceed with total knee replacement.  Risks of infection,   DVT, component failure,  need for revision surgery, postop course, and   expectations were all   discussed and reviewed.  Consent was obtained for benefit of pain   relief.      PROCEDURE IN DETAIL:  The patient was brought to the operative theater.   Once adequate anesthesia, preoperative antibiotics, 2 gm of Ancef, 1 gm of Tranexamic Acid, and 10 mg of Decadron administered, the patient was positioned supine with the right thigh tourniquet placed.  The  right lower extremity was prepped and draped in sterile fashion.  A time-   out was performed identifying the patient, planned procedure, and   extremity.      The right lower extremity was placed in the [facility] leg holder.  The leg was   exsanguinated, tourniquet elevated to 250 mmHg.  A midline incision was   made  followed by median parapatellar arthrotomy.  Following initial   exposure, attention was first directed to the patella.  Precut   measurement was noted to be 22 mm.  I resected down to 14 mm and used a   32 anatomic patellar button to restore patellar height as well as cover the cut   surface.      The lug holes were drilled and a metal shim was placed to protect the   patella from retractors and saw blades.      At this point, attention was now directed to the femur.  The femoral   canal was opened with a drill, irrigated to try to prevent fat emboli.  An   intramedullary rod was passed at 3 degrees valgus, 9 mm of bone was   resected off the distal femur.  Following this resection, the tibia was   subluxated anteriorly.  Using the extramedullary guide, 2 mm of bone was resected off   the proximal lateral tibia.  We confirmed the gap would be   stable medially and laterally with a size 5 mm insert as well as confirmed   the cut was perpendicular in the coronal plane, checking with an alignment rod.      Once this was done, I sized the femur to be a size 4 in the anterior-   posterior dimension, chose a standard component based on medial and    lateral dimension.  The size 4 rotation block was then pinned in   position anterior referenced using the C-clamp to set rotation.  The   anterior, posterior, and  chamfer cuts were made without difficulty nor   notching making certain that I was along the anterior cortex to help   with flexion gap stability.      The final box cut was made off the lateral aspect of distal femur.      At this point, the tibia was sized to be a size 3, the size 3 tray was   then pinned in position through the medial third of the tubercle,   drilled, and keel punched.  Trial reduction was now carried with a 4 femur,  3 tibia, a size 5 then the 6 mm PS insert, and the 32 anatomic patella botton.  The knee was brought to   extension, full extension with good flexion stability with the patella   tracking through the trochlea without application of pressure.  Given   all these findings, the trial components removed.  Final components were   opened and cement was mixed.  The knee was irrigated with normal saline   solution and pulse lavage.  The synovial lining was   then injected with 0.25% Marcaine with epinephrine and 1 cc of Toradol,   total of 61 cc.      The knee was irrigated.  Final implants were then cemented onto clean and   dried cut surfaces of bone with the knee brought to extension with a size 6 mm trial insert.      Once the cement had fully cured, the excess cement was removed   throughout the knee.  I confirmed I was satisfied with the range of   motion and stability, and the final size 6 mm PS AOX insert was chosen.  It was   placed into the knee.      The tourniquet had been let down at 26 minutes.  No significant   hemostasis required.  The   extensor mechanism  was then reapproximated using #1 Vicryl and #0 V-lock sutures with the knee   in flexion.  The   remaining wound was closed with 2-0 Vicryl and running 4-0 Monocryl.   The knee was cleaned, dried, dressed sterilely using Dermabond  and   Aquacel dressing.  The patient was then   brought to recovery room in stable condition, tolerating the procedure   well.   Please note that Physician Assistant, Nehemiah Massed, PA-C, was present for the entirety of the case, and was utilized for pre-operative positioning, peri-operative retractor management, general facilitation of the procedure.  He was also utilized for primary wound closure at the end of the case.              Pietro Cassis Alvan Dame, M.D.    12/01/2015 10:27 AM

## 2015-12-01 NOTE — Interval H&P Note (Signed)
History and Physical Interval Note:  12/01/2015 7:21 AM  Elizabeth Everett  has presented today for surgery, with the diagnosis of RIGHT KNEE OA  The various methods of treatment have been discussed with the patient and family. After consideration of risks, benefits and other options for treatment, the patient has consented to  Procedure(s): RIGHT TOTAL KNEE ARTHROPLASTY (Right) as a surgical intervention .  [name] history has been reviewed, patient examined, no change in status, stable for surgery.  I have reviewed the patient's chart and labs.  Questions were answered to the patient's satisfaction.     Mauri Pole

## 2015-12-01 NOTE — Transfer of Care (Signed)
Immediate Anesthesia Transfer of Care Note  Patient: Elizabeth Everett  Procedure(s) Performed: Procedure(s): RIGHT TOTAL KNEE ARTHROPLASTY (Right)  Patient Location: PACU  Anesthesia Type:Spinal  Level of Consciousness: awake, alert  and oriented  Airway & Oxygen Therapy: Patient Spontanous Breathing and Patient connected to nasal cannula oxygen  Post-op Assessment: Report given to RN and Post -op Vital signs reviewed and stable  Post vital signs: Reviewed and stable  Last Vitals:  Filed Vitals:   12/01/15 0609  BP: 177/59  Pulse: 54  Temp: 36.5 C  Resp: 18    Complications: No apparent anesthesia complications

## 2015-12-02 LAB — CBC
HCT: 31.7 % — ABNORMAL LOW (ref 36.0–46.0)
Hemoglobin: 10 g/dL — ABNORMAL LOW (ref 12.0–15.0)
MCH: 29.9 pg (ref 26.0–34.0)
MCHC: 31.5 g/dL (ref 30.0–36.0)
MCV: 94.6 fL (ref 78.0–100.0)
PLATELETS: 183 10*3/uL (ref 150–400)
RBC: 3.35 MIL/uL — AB (ref 3.87–5.11)
RDW: 14.2 % (ref 11.5–15.5)
WBC: 16.3 10*3/uL — AB (ref 4.0–10.5)

## 2015-12-02 LAB — BASIC METABOLIC PANEL
ANION GAP: 7 (ref 5–15)
BUN: 19 mg/dL (ref 6–20)
CO2: 26 mmol/L (ref 22–32)
Calcium: 8.7 mg/dL — ABNORMAL LOW (ref 8.9–10.3)
Chloride: 107 mmol/L (ref 101–111)
Creatinine, Ser: 0.9 mg/dL (ref 0.44–1.00)
GFR calc Af Amer: >60 mL/min (ref >60)
GFR, EST NON AFRICAN AMERICAN: 58 mL/min — AB (ref >60)
GLUCOSE: 163 mg/dL — AB (ref 65–99)
POTASSIUM: 4.9 mmol/L (ref 3.5–5.1)
Sodium: 140 mmol/L (ref 135–145)

## 2015-12-02 MED ORDER — POLYETHYLENE GLYCOL 3350 17 G PO PACK: 17.0000 g | PACK | Freq: Two times a day (BID) | ORAL | Status: DC

## 2015-12-02 MED ORDER — TIZANIDINE HCL 4 MG PO TABS: 4.0000 mg | ORAL_TABLET | Freq: Four times a day (QID) | ORAL | Status: DC | PRN

## 2015-12-02 MED ORDER — DOCUSATE SODIUM 100 MG PO CAPS: 100.0000 mg | ORAL_CAPSULE | Freq: Two times a day (BID) | ORAL | Status: DC

## 2015-12-02 MED ORDER — HYDROCODONE-ACETAMINOPHEN 7.5-325 MG PO TABS: 1.0000 | ORAL_TABLET | ORAL | Status: DC | PRN

## 2015-12-02 MED ORDER — FERROUS SULFATE 325 (65 FE) MG PO TABS: 325.0000 mg | ORAL_TABLET | Freq: Three times a day (TID) | ORAL | Status: DC

## 2015-12-02 NOTE — Progress Notes (Signed)
   12/02/15 1500  PT Visit Information  Last PT Received On 12/02/15  Assistance Needed +1  History of Present Illness s/p R TKA  PT Time Calculation  PT Start Time (ACUTE ONLY) 1426  PT Stop Time (ACUTE ONLY) 1457  PT Time Calculation (min) (ACUTE ONLY) 31 min  Subjective Data  Patient Stated Goal get back to active lifestyle  Precautions  Precautions Knee  Restrictions  Other Position/Activity Restrictions WBAT  Pain Assessment  Pain Assessment Faces  Faces Pain Scale 2  Pain Location R knee  Pain Descriptors / Indicators Aching;Sore;Grimacing  Pain Intervention(s) Limited activity within patient's tolerance;Monitored during session;Premedicated before session;Ice applied  Cognition  Arousal/Alertness Awake/alert  Behavior During Therapy WFL for tasks assessed/performed  Overall Cognitive Status Within Functional Limits for tasks assessed  Bed Mobility  General bed mobility comments in chair  Transfers  Overall transfer level Needs assistance  Equipment used Rolling walker (2 wheeled)  Transfers Sit to/from Stand  Sit to Stand Min guard  General transfer comment cues for hand placement and RLE position  Ambulation/Gait  Ambulation/Gait assistance Min guard  Ambulation Distance (Feet) 130 Feet  Assistive device Rolling walker (2 wheeled)  Gait Pattern/deviations Step-to pattern;Antalgic;Step-through pattern;Decreased stride length  General Gait Details cues for RW safety and sequence, cues to get R heel to floor during stance phase  Total Joint Exercises  Ankle Circles/Pumps AROM;Both;15 reps  Quad Sets 15 reps;Both;AROM;Strengthening  Goniometric ROM 8 to 85* flexion  Knee Flexion AROM;Right;AAROM;5 reps;Seated  PT - End of Session  Equipment Utilized During Treatment Gait belt  Activity Tolerance Patient tolerated treatment well  Patient left in chair;with call bell/phone within reach;with chair alarm set;with family/visitor present  Nurse Communication Mobility  status  PT - Assessment/Plan  PT Plan Current plan remains appropriate  PT Frequency (ACUTE ONLY) 7X/week  Follow Up Recommendations Home health PT;Supervision for mobility/OOB  PT equipment None recommended by PT  PT Goal Progression  Progress towards PT goals Progressing toward goals  Acute Rehab PT Goals  PT Goal Formulation With patient  Time For Goal Achievement 12/03/15  Potential to Achieve Goals Good  PT General Charges  $$ ACUTE PT VISIT 1 Procedure  PT Treatments  $Gait Training 23-37 mins

## 2015-12-02 NOTE — Evaluation (Signed)
Occupational Therapy Evaluation Patient Details Name: Elizabeth Everett MRN: [id number] DOB: Apr 08, 1932 Today's Date: 12/02/2015    History of Present Illness s/p R TKA   Clinical Impression   This 82 year old female was admitted for the above surgery. All education was completed. No further OT is needed at this time.    Follow Up Recommendations  No OT follow up;Supervision/Assistance - 24 hour    Equipment Recommendations  None recommended by OT    Recommendations for Other Services       Precautions / Restrictions Precautions Precautions: Knee Restrictions Weight Bearing Restrictions: No      Mobility Bed Mobility         Supine to sit: Min guard     General bed mobility comments: for safety  Transfers   Equipment used: Rolling walker (2 wheeled) Transfers: Sit to/from Stand Sit to Stand: Min guard         General transfer comment: cues for hand placement and RLE position    Balance                                            ADL Overall ADL's : Needs assistance/impaired     Grooming: Wash/dry hands;Min guard;Standing   Upper Body Bathing: Set up;Sitting   Lower Body Bathing: Minimal assistance;Sit to/from stand   Upper Body Dressing : Set up;Sitting   Lower Body Dressing: Moderate assistance;Sit to/from stand   Toilet Transfer: Min guard;Ambulation;BSC;RW   Toileting- Water quality scientist and Hygiene: Min guard;Sit to/from stand         General ADL Comments: Pt moves very quickly--HOH.  Cued for safety.  Husband/daughter will assist with adls as needed. Back pain limiting her from reaching foot; she used to cross RLE over L.  Pt does not place foot completely on floor. Cued to use arms to take weight and to try to flatten foot, if possible.  Pt can go into shower stall forward as it is accessible.  Pt does not sleep well and gets up multiple times during night. Recommended KI to sleep in to keep leg straight: requested  this through ortho tech.  She can get up with this on.  She will need assistance initially.  pt was SOB when she reached the bathroom.  Upon returning to chair, 02 was 85%; with deep breathing she brought it back to 97% on RA     Vision     Perception     Praxis      Pertinent Vitals/Pain Pain Assessment: Faces Faces Pain Scale: Hurts even more Pain Location: R TKA Pain Descriptors / Indicators: Aching Pain Intervention(s): Limited activity within patient's tolerance;Monitored during session;Premedicated before session;Repositioned (ice waiting for after PT session)     Hand Dominance     Extremity/Trunk Assessment Upper Extremity Assessment Upper Extremity Assessment: Overall WFL for tasks assessed           Communication Communication Communication: HOH   Cognition Arousal/Alertness: Awake/alert Behavior During Therapy: WFL for tasks assessed/performed Overall Cognitive Status:  (impulsive--cues for safety)                     General Comments       Exercises       Shoulder Instructions      Home Living Family/patient expects to be discharged to:: Private residence Living Arrangements: Spouse/significant other Available Help  at Discharge: Family               Bathroom Shower/Tub: Walk-in shower (accessible)   Bathroom Toilet: Handicapped height     Home Equipment: Environmental consultant - 2 wheels;Bedside commode;Cane - single point;Shower seat - built in          Prior Functioning/Environment Level of Independence: Independent;Independent with assistive device(s)             OT Diagnosis: Acute pain   OT Problem List:     OT Treatment/Interventions:      OT Goals(Current goals can be found in the care plan section) Acute Rehab OT Goals Patient Stated Goal: get back to active lifestyle OT Goal Formulation: All assessment and education complete, DC therapy  OT Frequency:     Barriers to D/C:            Co-evaluation               End of Session    Activity Tolerance: Patient tolerated treatment well Patient left: in chair;with call bell/phone within reach;with family/visitor present   Time: 1130-1158 OT Time Calculation (min): 28 min Charges:  OT General Charges $OT Visit: 1 Procedure OT Evaluation $OT Eval Low Complexity: 1 Procedure G-Codes:    Hendrik Donath December 27, 2015, 12:48 PM Lesle Chris, OTR/L [phone] 12/27/2015

## 2015-12-02 NOTE — Progress Notes (Signed)
Foley discontinued at 06:45am today. Pt due to void by 12:30 pm today will follow up with dayshift RN

## 2015-12-02 NOTE — Progress Notes (Signed)
     Subjective: 1 Day Post-Op Procedure(s) (LRB): RIGHT TOTAL KNEE ARTHROPLASTY (Right)   Patient reports pain as mild, pain controlled. No events throughout the night.  Ready to be discharged home if she does well with PT and pain stays controlled.   Objective:   VITALS:   Filed Vitals:   12/02/15 0640 12/02/15 0911  BP: 141/56 145/94  Pulse: 59 68  Temp: 97.9 F (36.6 C)   Resp: 16     Dorsiflexion/Plantar flexion intact Incision: dressing C/D/I No cellulitis present Compartment soft  LABS  Recent Labs  12/02/15 0448  HGB 10.0*  HCT 31.7*  WBC 16.3*  PLT 183     Recent Labs  12/02/15 0448  NA 140  K 4.9  BUN 19  CREATININE 0.90  GLUCOSE 163*     Assessment/Plan: 1 Day Post-Op Procedure(s) (LRB): RIGHT TOTAL KNEE ARTHROPLASTY (Right) Foley cath d/c'ed Advance diet Up with therapy D/C IV fluids Discharge home with home health  Follow up in 2 weeks at [facility]. Follow up with OLIN,Ly Bacchi D in 2 weeks.  Contact information:  [facility] [address] [place] [id number]    Overweight (BMI 25-29.9) Estimated body mass index is 29.49 kg/(m^2) as calculated from the following:   Height as of this encounter: 5' (1.524 m).   Weight as of this encounter: 68.493 kg (151 lb). Patient also counseled that weight may inhibit the healing process Patient counseled that losing weight will help with future health issues        [place] Pugh. Dahir Ayer   PAC  12/02/2015, 9:38 AM

## 2015-12-02 NOTE — Progress Notes (Signed)
Physical Therapy Treatment Patient Details Name: Elizabeth Everett MRN: [id number] DOB: 03-12-1932 Today's Date: 12/02/2015    History of Present Illness s/p R TKA    PT Comments    Pt progressing well, pain well controlled with therapy; dtr and husband present for session and are very supportive;   Follow Up Recommendations  Home health PT;Supervision for mobility/OOB     Equipment Recommendations  None recommended by PT    Recommendations for Other Services       Precautions / Restrictions Precautions Precautions: Knee Restrictions Weight Bearing Restrictions: No Other Position/Activity Restrictions: WBAT    Mobility  Bed Mobility Overal bed mobility: Needs Assistance Bed Mobility: Supine to Sit     Supine to sit: Min guard     General bed mobility comments: in chair  Transfers Overall transfer level: Needs assistance Equipment used: Rolling walker (2 wheeled) Transfers: Sit to/from Stand Sit to Stand: Min guard         General transfer comment: cues for hand placement and RLE position  Ambulation/Gait Ambulation/Gait assistance: Min guard;Min assist Ambulation Distance (Feet): 120 Feet Assistive device: Rolling walker (2 wheeled) Gait Pattern/deviations: Step-to pattern;Step-through pattern     General Gait Details: cues for RW safety and sequence, cues to get R heel to floor during stance phase   Stairs            Wheelchair Mobility    Modified Rankin (Stroke Patients Only)       Balance                                    Cognition Arousal/Alertness: Awake/alert Behavior During Therapy: WFL for tasks assessed/performed Overall Cognitive Status: Within Functional Limits for tasks assessed                      Exercises Total Joint Exercises Ankle Circles/Pumps: AROM;Both;15 reps Quad Sets: 15 reps;Both;AROM;Strengthening Heel Slides: AAROM;Right;10 reps Hip ABduction/ADduction: AAROM;Right;10  reps Straight Leg Raises: AAROM;Right;5 reps;Limitations Straight Leg Raises Limitations: reps limited d/t back pain/sciatica, pt reports SLR exacerbates    General Comments        Pertinent Vitals/Pain Pain Assessment: Faces Faces Pain Scale: Hurts little more Pain Location: R knee Pain Descriptors / Indicators: Grimacing;Guarding Pain Intervention(s): Limited activity within patient's tolerance;Monitored during session;RN gave pain meds during session;Ice applied    Home Living Family/patient expects to be discharged to:: Private residence Living Arrangements: Spouse/significant other Available Help at Discharge: Family         Home Equipment: Gilford Rile - 2 wheels;Bedside commode;Cane - single point;Shower seat - built in      Prior Function Level of Independence: Independent;Independent with assistive device(s)          PT Goals (current goals can now be found in the care plan section) Acute Rehab PT Goals Patient Stated Goal: get back to active lifestyle PT Goal Formulation: With patient Time For Goal Achievement: 12/03/15 Potential to Achieve Goals: Good Progress towards PT goals: Progressing toward goals    Frequency  7X/week    PT Plan Current plan remains appropriate    Co-evaluation             End of Session Equipment Utilized During Treatment: Gait belt Activity Tolerance: Patient tolerated treatment well Patient left: in chair;with call bell/phone within reach;with chair alarm set;with family/visitor present     Time: [id number] PT Time Calculation (min) (ACUTE  ONLY): 26 min  Charges:  $Gait Training: 8-22 mins $Therapeutic Exercise: 8-22 mins                    G Codes:      Sheron Tallman 12-06-2015, 1:54 PM

## 2015-12-02 NOTE — Discharge Instructions (Signed)

## 2015-12-02 NOTE — Care Management Note (Signed)
Case Management Note  Patient Details  Name: Elizabeth Everett MRN: [id number] Date of Birth: Mar 09, 1932  Subjective/Objective:                  TKA Action/Plan: Discharge planning Expected Discharge Date:  12/02/15               Expected Discharge Plan:  [place]  In-House Referral:     Discharge planning Services  CM Consult  Post Acute Care Choice:    Choice offered to:  Patient  DME Arranged:  N/A DME Agency:  NA  HH Arranged:  PT HH Agency:  [facility]  Status of Service:  Completed, signed off  Medicare Important Message Given:    Date Medicare IM Given:    Medicare IM give by:    Date Additional Medicare IM Given:    Additional Medicare Important Message give by:     If discussed at [place] of Stay Meetings, dates discussed:    Additional Comments: CM met with pt in room to offer choice of home health agency.  Pt chooses [facility] to render HHPT.  Pt has all DME needed at home.  Referral given to [employer], Tim.  No other CM needs were communicated. Dellie Catholic, RN 12/02/2015, 12:14 PM

## 2015-12-03 DIAGNOSIS — Z7901 Long term (current) use of anticoagulants: Secondary | ICD-10-CM | POA: Diagnosis not present

## 2015-12-03 DIAGNOSIS — I10 Essential (primary) hypertension: Secondary | ICD-10-CM | POA: Diagnosis not present

## 2015-12-03 DIAGNOSIS — I4891 Unspecified atrial fibrillation: Secondary | ICD-10-CM | POA: Diagnosis not present

## 2015-12-03 DIAGNOSIS — Z96651 Presence of right artificial knee joint: Secondary | ICD-10-CM | POA: Diagnosis not present

## 2015-12-03 DIAGNOSIS — H3530 Unspecified macular degeneration: Secondary | ICD-10-CM | POA: Diagnosis not present

## 2015-12-03 DIAGNOSIS — Z471 Aftercare following joint replacement surgery: Secondary | ICD-10-CM | POA: Diagnosis not present

## 2015-12-04 DIAGNOSIS — Z96651 Presence of right artificial knee joint: Secondary | ICD-10-CM | POA: Diagnosis not present

## 2015-12-04 DIAGNOSIS — I4891 Unspecified atrial fibrillation: Secondary | ICD-10-CM | POA: Diagnosis not present

## 2015-12-04 DIAGNOSIS — Z7901 Long term (current) use of anticoagulants: Secondary | ICD-10-CM | POA: Diagnosis not present

## 2015-12-04 DIAGNOSIS — I10 Essential (primary) hypertension: Secondary | ICD-10-CM | POA: Diagnosis not present

## 2015-12-04 DIAGNOSIS — Z471 Aftercare following joint replacement surgery: Secondary | ICD-10-CM | POA: Diagnosis not present

## 2015-12-04 DIAGNOSIS — H3530 Unspecified macular degeneration: Secondary | ICD-10-CM | POA: Diagnosis not present

## 2015-12-07 DIAGNOSIS — I4891 Unspecified atrial fibrillation: Secondary | ICD-10-CM | POA: Diagnosis not present

## 2015-12-07 DIAGNOSIS — I10 Essential (primary) hypertension: Secondary | ICD-10-CM | POA: Diagnosis not present

## 2015-12-07 DIAGNOSIS — Z471 Aftercare following joint replacement surgery: Secondary | ICD-10-CM | POA: Diagnosis not present

## 2015-12-07 DIAGNOSIS — Z96651 Presence of right artificial knee joint: Secondary | ICD-10-CM | POA: Diagnosis not present

## 2015-12-07 DIAGNOSIS — Z7901 Long term (current) use of anticoagulants: Secondary | ICD-10-CM | POA: Diagnosis not present

## 2015-12-07 DIAGNOSIS — H3530 Unspecified macular degeneration: Secondary | ICD-10-CM | POA: Diagnosis not present

## 2015-12-08 NOTE — Discharge Summary (Signed)
Physician Discharge Summary  Patient ID: Elizabeth Everett MRN: [id number] DOB/AGE: 05-17-1932 82 y.o.  Admit date: 12/01/2015 Discharge date: 12/02/2015   Procedures:  Procedure(s) (LRB): RIGHT TOTAL KNEE ARTHROPLASTY (Right)  Attending Physician:  Dr. Paralee Cancel   Admission Diagnoses:   Right knee primary OA / pain  Discharge Diagnoses:  Principal Problem:   S/P right TKA  Past Medical History  Diagnosis Date  . Hypercholesteremia   . Hypertension   . Atrial fibrillation ([place])   . Macular degeneration   . Toxic metabolic encephalopathy   . Acute respiratory failure with hypoxia and hypercapnia (HCC)   . Colonic pseudoobstruction   . Bradycardia   . Pancreatitis   . Dysrhythmia   . Heart murmur   . Edema of lower extremity     bilat   . Baker's cyst of knee     right   . Multiple falls   . Shortness of breath dyspnea     can not climb full set of steps without stopping   . History of bronchitis   . Hard of hearing   . Dizziness   . History of kidney stones   . GERD (gastroesophageal reflux disease)   . Arthritis   . Scoliosis   . Cancer ([place])     skin cancer; endometrial cancer   . History of atrial flutter     HPI:    Elizabeth Everett, 82 y.o. female, has a history of pain and functional disability in the right knee due to arthritis and has failed non-surgical conservative treatments for greater than 12 weeks to include NSAID's and/or analgesics, corticosteriod injections and activity modification. Onset of symptoms was gradual, starting 2-3 years ago with gradually worsening course since that time. The patient noted prior procedures on the knee to include arthroscopy on the right knee(s). Patient currently rates pain in the right knee(s) at 8 out of 10 with activity. Patient has worsening of pain with activity and weight bearing, pain that interferes with activities of daily living, pain with passive range of motion, crepitus and joint swelling. Patient has  evidence of periarticular osteophytes and joint space narrowing by imaging studies. There is no active infection. Risks, benefits and expectations were discussed with the patient. Risks including but not limited to the risk of anesthesia, blood clots, nerve damage, blood vessel damage, failure of the prosthesis, infection and up to and including death. Patient understand the risks, benefits and expectations and wishes to proceed with surgery.   PCP: Default, Provider, MD   Discharged Condition: good  Hospital Course:  Patient underwent the above stated procedure on 12/01/2015. Patient tolerated the procedure well and brought to the recovery room in good condition and subsequently to the floor.  POD #1 BP: 145/94 ; Pulse: 68 ; Temp: 97.9 F (36.6 C) ; Resp: 16 Patient reports pain as mild, pain controlled. No events throughout the night. Ready to be discharged home. Dorsiflexion/plantar flexion intact, incision: dressing C/D/I, no cellulitis present and compartment soft.   LABS  Basename    HGB     10.0  HCT     31.7     Discharge Exam: General appearance: alert, cooperative and no distress Extremities: Homans sign is negative, no sign of DVT, no edema, redness or tenderness in the calves or thighs and no ulcers, gangrene or trophic changes  Disposition: Home with follow up in 2 weeks   Follow-up Information    Follow up with Mauri Pole, MD. Schedule an  appointment as soon as possible for a visit in 2 weeks.   Specialty:  Orthopedic Surgery   Contact information:   [address] [place] [address] [phone]       Follow up with [facility].   Why:  home health physical therapy   Contact information:   [place] [id number] [phone]       Discharge Instructions    Call MD / Call 911    Complete by:  As directed   If you experience chest pain or shortness of breath, CALL 911 and be transported to the  hospital emergency room.  If you develope a fever above 101 F, pus (white drainage) or increased drainage or redness at the wound, or calf pain, call your surgeon's office.     Change dressing    Complete by:  As directed   Maintain surgical dressing until follow up in the clinic. If the edges start to pull up, may reinforce with tape. If the dressing is no longer working, may remove and cover with gauze and tape, but must keep the area dry and clean.  Call with any questions or concerns.     Constipation Prevention    Complete by:  As directed   Drink plenty of fluids.  Prune juice may be helpful.  You may use a stool softener, such as Colace (over the counter) 100 mg twice a day.  Use MiraLax (over the counter) for constipation as needed.     Diet - low sodium heart healthy    Complete by:  As directed      Discharge instructions    Complete by:  As directed   Maintain surgical dressing until follow up in the clinic. If the edges start to pull up, may reinforce with tape. If the dressing is no longer working, may remove and cover with gauze and tape, but must keep the area dry and clean.  Follow up in 2 weeks at [facility]. Call with any questions or concerns.     Increase activity slowly as tolerated    Complete by:  As directed   Weight bearing as tolerated with assist device (walker, cane, etc) as directed, use it as long as suggested by your surgeon or therapist, typically at least 4-6 weeks.     TED hose    Complete by:  As directed   Use stockings (TED hose) for 2 weeks on both leg(s).  You may remove them at night for sleeping.             Medication List    TAKE these medications        amiodarone 200 MG tablet  Commonly known as:  PACERONE  Take 200 mg by mouth daily.     diazepam 5 MG tablet  Commonly known as:  VALIUM  Take 1 tablet (5 mg total) by mouth as directed. Take 1/2 hour prior to appointment and another if needed.     docusate sodium 100 MG  capsule  Commonly known as:  COLACE  Take 1 capsule (100 mg total) by mouth 2 (two) times daily.     ELIQUIS 5 MG Tabs tablet  Generic drug:  apixaban  Take 5 mg by mouth daily.     EYE VITAMINS PO  Take 1 tablet by mouth daily.     ferrous sulfate 325 (65 FE) MG tablet  Take 1 tablet (325 mg total) by mouth 3 (three)  times daily after meals.     FOLIC ACID PO  Take 1 tablet by mouth daily.     furosemide 20 MG tablet  Commonly known as:  LASIX  Take 20 mg by mouth every other day.     HYDROcodone-acetaminophen 7.5-325 MG tablet  Commonly known as:  NORCO  Take 1-2 tablets by mouth every 4 (four) hours as needed for moderate pain.     irbesartan 150 MG tablet  Commonly known as:  AVAPRO  Take 75 mg by mouth daily.     KLOR-CON 10 10 MEQ tablet  Generic drug:  potassium chloride  Take 10 mEq by mouth every other day.     metoprolol 50 MG tablet  Commonly known as:  LOPRESSOR  Take 25 mg by mouth 2 (two) times daily.     pantoprazole 40 MG tablet  Commonly known as:  PROTONIX  Take 40 mg by mouth daily.     polyethylene glycol packet  Commonly known as:  MIRALAX / GLYCOLAX  Take 17 g by mouth 2 (two) times daily.     simvastatin 20 MG tablet  Commonly known as:  ZOCOR  Take 20 mg by mouth daily.     tiZANidine 4 MG tablet  Commonly known as:  ZANAFLEX  Take 1 tablet (4 mg total) by mouth every 6 (six) hours as needed for muscle spasms.     VITAMIN B-12 PO  Take 1 tablet by mouth daily.     VITAMIN B-6 PO  Take 1 tablet by mouth daily.     VITAMIN E PO  Take 1 capsule by mouth every other day.         Signed: [place] Pugh. Aariah Godette   PA-C  12/08/2015, 10:02 AM

## 2015-12-09 DIAGNOSIS — Z96651 Presence of right artificial knee joint: Secondary | ICD-10-CM | POA: Diagnosis not present

## 2015-12-09 DIAGNOSIS — I10 Essential (primary) hypertension: Secondary | ICD-10-CM | POA: Diagnosis not present

## 2015-12-09 DIAGNOSIS — I4891 Unspecified atrial fibrillation: Secondary | ICD-10-CM | POA: Diagnosis not present

## 2015-12-09 DIAGNOSIS — Z471 Aftercare following joint replacement surgery: Secondary | ICD-10-CM | POA: Diagnosis not present

## 2015-12-09 DIAGNOSIS — H3530 Unspecified macular degeneration: Secondary | ICD-10-CM | POA: Diagnosis not present

## 2015-12-09 DIAGNOSIS — Z7901 Long term (current) use of anticoagulants: Secondary | ICD-10-CM | POA: Diagnosis not present

## 2015-12-11 DIAGNOSIS — I4891 Unspecified atrial fibrillation: Secondary | ICD-10-CM | POA: Diagnosis not present

## 2015-12-11 DIAGNOSIS — H3530 Unspecified macular degeneration: Secondary | ICD-10-CM | POA: Diagnosis not present

## 2015-12-11 DIAGNOSIS — Z471 Aftercare following joint replacement surgery: Secondary | ICD-10-CM | POA: Diagnosis not present

## 2015-12-11 DIAGNOSIS — I10 Essential (primary) hypertension: Secondary | ICD-10-CM | POA: Diagnosis not present

## 2015-12-11 DIAGNOSIS — Z96651 Presence of right artificial knee joint: Secondary | ICD-10-CM | POA: Diagnosis not present

## 2015-12-11 DIAGNOSIS — Z7901 Long term (current) use of anticoagulants: Secondary | ICD-10-CM | POA: Diagnosis not present

## 2015-12-14 DIAGNOSIS — Z471 Aftercare following joint replacement surgery: Secondary | ICD-10-CM | POA: Diagnosis not present

## 2015-12-14 DIAGNOSIS — R2689 Other abnormalities of gait and mobility: Secondary | ICD-10-CM | POA: Diagnosis not present

## 2015-12-14 DIAGNOSIS — M6281 Muscle weakness (generalized): Secondary | ICD-10-CM | POA: Diagnosis not present

## 2015-12-14 DIAGNOSIS — Z96651 Presence of right artificial knee joint: Secondary | ICD-10-CM | POA: Diagnosis not present

## 2015-12-16 DIAGNOSIS — Z96651 Presence of right artificial knee joint: Secondary | ICD-10-CM | POA: Diagnosis not present

## 2015-12-16 DIAGNOSIS — Z471 Aftercare following joint replacement surgery: Secondary | ICD-10-CM | POA: Diagnosis not present

## 2015-12-18 DIAGNOSIS — Z96651 Presence of right artificial knee joint: Secondary | ICD-10-CM | POA: Diagnosis not present

## 2015-12-18 DIAGNOSIS — Z471 Aftercare following joint replacement surgery: Secondary | ICD-10-CM | POA: Diagnosis not present

## 2015-12-18 DIAGNOSIS — M6281 Muscle weakness (generalized): Secondary | ICD-10-CM | POA: Diagnosis not present

## 2015-12-18 DIAGNOSIS — R2689 Other abnormalities of gait and mobility: Secondary | ICD-10-CM | POA: Diagnosis not present

## 2015-12-21 DIAGNOSIS — M6281 Muscle weakness (generalized): Secondary | ICD-10-CM | POA: Diagnosis not present

## 2015-12-21 DIAGNOSIS — R2689 Other abnormalities of gait and mobility: Secondary | ICD-10-CM | POA: Diagnosis not present

## 2015-12-21 DIAGNOSIS — Z471 Aftercare following joint replacement surgery: Secondary | ICD-10-CM | POA: Diagnosis not present

## 2015-12-21 DIAGNOSIS — Z96651 Presence of right artificial knee joint: Secondary | ICD-10-CM | POA: Diagnosis not present

## 2015-12-28 DIAGNOSIS — M6281 Muscle weakness (generalized): Secondary | ICD-10-CM | POA: Diagnosis not present

## 2015-12-28 DIAGNOSIS — Z471 Aftercare following joint replacement surgery: Secondary | ICD-10-CM | POA: Diagnosis not present

## 2015-12-28 DIAGNOSIS — L820 Inflamed seborrheic keratosis: Secondary | ICD-10-CM | POA: Diagnosis not present

## 2015-12-28 DIAGNOSIS — Z85828 Personal history of other malignant neoplasm of skin: Secondary | ICD-10-CM | POA: Diagnosis not present

## 2015-12-28 DIAGNOSIS — Z08 Encounter for follow-up examination after completed treatment for malignant neoplasm: Secondary | ICD-10-CM | POA: Diagnosis not present

## 2015-12-28 DIAGNOSIS — R2689 Other abnormalities of gait and mobility: Secondary | ICD-10-CM | POA: Diagnosis not present

## 2015-12-28 DIAGNOSIS — L570 Actinic keratosis: Secondary | ICD-10-CM | POA: Diagnosis not present

## 2015-12-28 DIAGNOSIS — Z96651 Presence of right artificial knee joint: Secondary | ICD-10-CM | POA: Diagnosis not present

## 2015-12-31 DIAGNOSIS — M6281 Muscle weakness (generalized): Secondary | ICD-10-CM | POA: Diagnosis not present

## 2015-12-31 DIAGNOSIS — Z96651 Presence of right artificial knee joint: Secondary | ICD-10-CM | POA: Diagnosis not present

## 2015-12-31 DIAGNOSIS — Z471 Aftercare following joint replacement surgery: Secondary | ICD-10-CM | POA: Diagnosis not present

## 2015-12-31 DIAGNOSIS — R2689 Other abnormalities of gait and mobility: Secondary | ICD-10-CM | POA: Diagnosis not present

## 2016-01-04 DIAGNOSIS — Z96651 Presence of right artificial knee joint: Secondary | ICD-10-CM | POA: Diagnosis not present

## 2016-01-04 DIAGNOSIS — M6281 Muscle weakness (generalized): Secondary | ICD-10-CM | POA: Diagnosis not present

## 2016-01-04 DIAGNOSIS — Z471 Aftercare following joint replacement surgery: Secondary | ICD-10-CM | POA: Diagnosis not present

## 2016-01-04 DIAGNOSIS — R2689 Other abnormalities of gait and mobility: Secondary | ICD-10-CM | POA: Diagnosis not present

## 2016-01-08 DIAGNOSIS — R2689 Other abnormalities of gait and mobility: Secondary | ICD-10-CM | POA: Diagnosis not present

## 2016-01-08 DIAGNOSIS — M6281 Muscle weakness (generalized): Secondary | ICD-10-CM | POA: Diagnosis not present

## 2016-01-08 DIAGNOSIS — Z471 Aftercare following joint replacement surgery: Secondary | ICD-10-CM | POA: Diagnosis not present

## 2016-01-08 DIAGNOSIS — Z96651 Presence of right artificial knee joint: Secondary | ICD-10-CM | POA: Diagnosis not present

## 2016-01-11 DIAGNOSIS — Z471 Aftercare following joint replacement surgery: Secondary | ICD-10-CM | POA: Diagnosis not present

## 2016-01-11 DIAGNOSIS — Z96651 Presence of right artificial knee joint: Secondary | ICD-10-CM | POA: Diagnosis not present

## 2016-01-11 DIAGNOSIS — R2689 Other abnormalities of gait and mobility: Secondary | ICD-10-CM | POA: Diagnosis not present

## 2016-01-11 DIAGNOSIS — M6281 Muscle weakness (generalized): Secondary | ICD-10-CM | POA: Diagnosis not present

## 2016-01-13 DIAGNOSIS — M6281 Muscle weakness (generalized): Secondary | ICD-10-CM | POA: Diagnosis not present

## 2016-01-13 DIAGNOSIS — R2689 Other abnormalities of gait and mobility: Secondary | ICD-10-CM | POA: Diagnosis not present

## 2016-01-13 DIAGNOSIS — Z471 Aftercare following joint replacement surgery: Secondary | ICD-10-CM | POA: Diagnosis not present

## 2016-01-13 DIAGNOSIS — Z96651 Presence of right artificial knee joint: Secondary | ICD-10-CM | POA: Diagnosis not present

## 2016-01-14 DIAGNOSIS — Z471 Aftercare following joint replacement surgery: Secondary | ICD-10-CM | POA: Diagnosis not present

## 2016-01-14 DIAGNOSIS — Z96651 Presence of right artificial knee joint: Secondary | ICD-10-CM | POA: Diagnosis not present

## 2016-01-21 DIAGNOSIS — R2689 Other abnormalities of gait and mobility: Secondary | ICD-10-CM | POA: Diagnosis not present

## 2016-01-21 DIAGNOSIS — Z471 Aftercare following joint replacement surgery: Secondary | ICD-10-CM | POA: Diagnosis not present

## 2016-01-21 DIAGNOSIS — M6281 Muscle weakness (generalized): Secondary | ICD-10-CM | POA: Diagnosis not present

## 2016-01-21 DIAGNOSIS — Z96651 Presence of right artificial knee joint: Secondary | ICD-10-CM | POA: Diagnosis not present

## 2016-01-26 DIAGNOSIS — R2689 Other abnormalities of gait and mobility: Secondary | ICD-10-CM | POA: Diagnosis not present

## 2016-01-26 DIAGNOSIS — Z96651 Presence of right artificial knee joint: Secondary | ICD-10-CM | POA: Diagnosis not present

## 2016-01-26 DIAGNOSIS — Z471 Aftercare following joint replacement surgery: Secondary | ICD-10-CM | POA: Diagnosis not present

## 2016-01-26 DIAGNOSIS — M6281 Muscle weakness (generalized): Secondary | ICD-10-CM | POA: Diagnosis not present

## 2016-01-29 DIAGNOSIS — Z96651 Presence of right artificial knee joint: Secondary | ICD-10-CM | POA: Diagnosis not present

## 2016-01-29 DIAGNOSIS — M6281 Muscle weakness (generalized): Secondary | ICD-10-CM | POA: Diagnosis not present

## 2016-01-29 DIAGNOSIS — Z471 Aftercare following joint replacement surgery: Secondary | ICD-10-CM | POA: Diagnosis not present

## 2016-01-29 DIAGNOSIS — R2689 Other abnormalities of gait and mobility: Secondary | ICD-10-CM | POA: Diagnosis not present

## 2016-02-03 DIAGNOSIS — R2689 Other abnormalities of gait and mobility: Secondary | ICD-10-CM | POA: Diagnosis not present

## 2016-02-03 DIAGNOSIS — Z96651 Presence of right artificial knee joint: Secondary | ICD-10-CM | POA: Diagnosis not present

## 2016-02-03 DIAGNOSIS — M6281 Muscle weakness (generalized): Secondary | ICD-10-CM | POA: Diagnosis not present

## 2016-02-03 DIAGNOSIS — Z471 Aftercare following joint replacement surgery: Secondary | ICD-10-CM | POA: Diagnosis not present

## 2016-02-05 DIAGNOSIS — R2689 Other abnormalities of gait and mobility: Secondary | ICD-10-CM | POA: Diagnosis not present

## 2016-02-05 DIAGNOSIS — Z471 Aftercare following joint replacement surgery: Secondary | ICD-10-CM | POA: Diagnosis not present

## 2016-02-05 DIAGNOSIS — Z96651 Presence of right artificial knee joint: Secondary | ICD-10-CM | POA: Diagnosis not present

## 2016-02-05 DIAGNOSIS — M6281 Muscle weakness (generalized): Secondary | ICD-10-CM | POA: Diagnosis not present

## 2016-02-09 DIAGNOSIS — Z471 Aftercare following joint replacement surgery: Secondary | ICD-10-CM | POA: Diagnosis not present

## 2016-02-09 DIAGNOSIS — Z96651 Presence of right artificial knee joint: Secondary | ICD-10-CM | POA: Diagnosis not present

## 2016-02-09 DIAGNOSIS — M6281 Muscle weakness (generalized): Secondary | ICD-10-CM | POA: Diagnosis not present

## 2016-02-09 DIAGNOSIS — R2689 Other abnormalities of gait and mobility: Secondary | ICD-10-CM | POA: Diagnosis not present

## 2016-02-24 DIAGNOSIS — Z96651 Presence of right artificial knee joint: Secondary | ICD-10-CM | POA: Diagnosis not present

## 2016-02-24 DIAGNOSIS — R2689 Other abnormalities of gait and mobility: Secondary | ICD-10-CM | POA: Diagnosis not present

## 2016-02-24 DIAGNOSIS — Z471 Aftercare following joint replacement surgery: Secondary | ICD-10-CM | POA: Diagnosis not present

## 2016-02-24 DIAGNOSIS — M6281 Muscle weakness (generalized): Secondary | ICD-10-CM | POA: Diagnosis not present

## 2016-02-26 DIAGNOSIS — Z96651 Presence of right artificial knee joint: Secondary | ICD-10-CM | POA: Diagnosis not present

## 2016-02-26 DIAGNOSIS — Z471 Aftercare following joint replacement surgery: Secondary | ICD-10-CM | POA: Diagnosis not present

## 2016-03-04 DIAGNOSIS — Z85828 Personal history of other malignant neoplasm of skin: Secondary | ICD-10-CM | POA: Diagnosis not present

## 2016-03-04 DIAGNOSIS — L570 Actinic keratosis: Secondary | ICD-10-CM | POA: Diagnosis not present

## 2016-03-04 DIAGNOSIS — Z08 Encounter for follow-up examination after completed treatment for malignant neoplasm: Secondary | ICD-10-CM | POA: Diagnosis not present

## 2016-03-04 DIAGNOSIS — L820 Inflamed seborrheic keratosis: Secondary | ICD-10-CM | POA: Diagnosis not present

## 2016-05-17 ENCOUNTER — Encounter: Payer: Self-pay | Admitting: Gastroenterology

## 2016-05-27 DIAGNOSIS — H43813 Vitreous degeneration, bilateral: Secondary | ICD-10-CM | POA: Diagnosis not present

## 2016-05-27 DIAGNOSIS — H2513 Age-related nuclear cataract, bilateral: Secondary | ICD-10-CM | POA: Diagnosis not present

## 2016-05-27 DIAGNOSIS — H353232 Exudative age-related macular degeneration, bilateral, with inactive choroidal neovascularization: Secondary | ICD-10-CM | POA: Diagnosis not present

## 2016-06-01 DIAGNOSIS — I10 Essential (primary) hypertension: Secondary | ICD-10-CM | POA: Diagnosis not present

## 2016-06-01 DIAGNOSIS — G4733 Obstructive sleep apnea (adult) (pediatric): Secondary | ICD-10-CM | POA: Diagnosis not present

## 2016-06-01 DIAGNOSIS — E785 Hyperlipidemia, unspecified: Secondary | ICD-10-CM | POA: Diagnosis not present

## 2016-06-01 DIAGNOSIS — Z79899 Other long term (current) drug therapy: Secondary | ICD-10-CM | POA: Diagnosis not present

## 2016-06-01 DIAGNOSIS — I4891 Unspecified atrial fibrillation: Secondary | ICD-10-CM | POA: Diagnosis not present

## 2016-07-11 DIAGNOSIS — L97811 Non-pressure chronic ulcer of other part of right lower leg limited to breakdown of skin: Secondary | ICD-10-CM | POA: Diagnosis not present

## 2016-08-09 DIAGNOSIS — Z23 Encounter for immunization: Secondary | ICD-10-CM | POA: Diagnosis not present

## 2016-09-05 DIAGNOSIS — Z08 Encounter for follow-up examination after completed treatment for malignant neoplasm: Secondary | ICD-10-CM | POA: Diagnosis not present

## 2016-09-05 DIAGNOSIS — L820 Inflamed seborrheic keratosis: Secondary | ICD-10-CM | POA: Diagnosis not present

## 2016-09-05 DIAGNOSIS — Z85828 Personal history of other malignant neoplasm of skin: Secondary | ICD-10-CM | POA: Diagnosis not present

## 2016-11-08 ENCOUNTER — Other Ambulatory Visit: Payer: Self-pay | Admitting: Family Medicine

## 2016-11-08 DIAGNOSIS — Z1231 Encounter for screening mammogram for malignant neoplasm of breast: Secondary | ICD-10-CM

## 2016-11-10 DIAGNOSIS — S0990XA Unspecified injury of head, initial encounter: Secondary | ICD-10-CM | POA: Diagnosis not present

## 2016-11-10 DIAGNOSIS — M25561 Pain in right knee: Secondary | ICD-10-CM | POA: Diagnosis not present

## 2016-11-10 DIAGNOSIS — S0083XA Contusion of other part of head, initial encounter: Secondary | ICD-10-CM | POA: Diagnosis not present

## 2016-11-10 DIAGNOSIS — S8001XA Contusion of right knee, initial encounter: Secondary | ICD-10-CM | POA: Diagnosis not present

## 2016-11-10 DIAGNOSIS — S060X0A Concussion without loss of consciousness, initial encounter: Secondary | ICD-10-CM | POA: Diagnosis not present

## 2016-11-10 DIAGNOSIS — S060X9A Concussion with loss of consciousness of unspecified duration, initial encounter: Secondary | ICD-10-CM | POA: Diagnosis not present

## 2016-11-10 DIAGNOSIS — S0181XA Laceration without foreign body of other part of head, initial encounter: Secondary | ICD-10-CM | POA: Diagnosis not present

## 2016-11-10 DIAGNOSIS — S8000XA Contusion of unspecified knee, initial encounter: Secondary | ICD-10-CM | POA: Diagnosis not present

## 2016-11-21 DIAGNOSIS — Z7901 Long term (current) use of anticoagulants: Secondary | ICD-10-CM | POA: Diagnosis not present

## 2016-11-21 DIAGNOSIS — I4892 Unspecified atrial flutter: Secondary | ICD-10-CM | POA: Diagnosis not present

## 2016-11-21 DIAGNOSIS — Z79899 Other long term (current) drug therapy: Secondary | ICD-10-CM | POA: Diagnosis not present

## 2016-11-21 DIAGNOSIS — E785 Hyperlipidemia, unspecified: Secondary | ICD-10-CM | POA: Diagnosis not present

## 2016-11-21 DIAGNOSIS — I10 Essential (primary) hypertension: Secondary | ICD-10-CM | POA: Diagnosis not present

## 2016-11-25 DIAGNOSIS — H43813 Vitreous degeneration, bilateral: Secondary | ICD-10-CM | POA: Diagnosis not present

## 2016-11-25 DIAGNOSIS — H353232 Exudative age-related macular degeneration, bilateral, with inactive choroidal neovascularization: Secondary | ICD-10-CM | POA: Diagnosis not present

## 2016-11-25 DIAGNOSIS — H2513 Age-related nuclear cataract, bilateral: Secondary | ICD-10-CM | POA: Diagnosis not present

## 2016-12-05 ENCOUNTER — Ambulatory Visit: Payer: Medicare Other

## 2016-12-07 DIAGNOSIS — I10 Essential (primary) hypertension: Secondary | ICD-10-CM | POA: Diagnosis not present

## 2016-12-07 DIAGNOSIS — G4733 Obstructive sleep apnea (adult) (pediatric): Secondary | ICD-10-CM | POA: Diagnosis not present

## 2016-12-07 DIAGNOSIS — I480 Paroxysmal atrial fibrillation: Secondary | ICD-10-CM | POA: Diagnosis not present

## 2016-12-07 DIAGNOSIS — E784 Other hyperlipidemia: Secondary | ICD-10-CM | POA: Diagnosis not present

## 2016-12-07 DIAGNOSIS — I483 Typical atrial flutter: Secondary | ICD-10-CM | POA: Diagnosis not present

## 2016-12-13 DIAGNOSIS — S81801A Unspecified open wound, right lower leg, initial encounter: Secondary | ICD-10-CM | POA: Diagnosis not present

## 2016-12-14 ENCOUNTER — Emergency Department (HOSPITAL_COMMUNITY): Payer: Medicare Other

## 2016-12-14 ENCOUNTER — Emergency Department (HOSPITAL_COMMUNITY)
Admission: EM | Admit: 2016-12-14 | Discharge: 2016-12-15 | Disposition: A | Discharge status: Home / Self Care | Payer: Medicare Other | Attending: Emergency Medicine | Admitting: Emergency Medicine

## 2016-12-14 ENCOUNTER — Encounter (HOSPITAL_COMMUNITY): Payer: Self-pay | Admitting: Emergency Medicine

## 2016-12-14 DIAGNOSIS — S199XXA Unspecified injury of neck, initial encounter: Secondary | ICD-10-CM | POA: Diagnosis not present

## 2016-12-14 DIAGNOSIS — Z79899 Other long term (current) drug therapy: Secondary | ICD-10-CM | POA: Insufficient documentation

## 2016-12-14 DIAGNOSIS — R55 Syncope and collapse: Secondary | ICD-10-CM | POA: Diagnosis not present

## 2016-12-14 DIAGNOSIS — Y999 Unspecified external cause status: Secondary | ICD-10-CM | POA: Insufficient documentation

## 2016-12-14 DIAGNOSIS — R51 Headache: Secondary | ICD-10-CM | POA: Diagnosis not present

## 2016-12-14 DIAGNOSIS — I10 Essential (primary) hypertension: Secondary | ICD-10-CM | POA: Diagnosis not present

## 2016-12-14 DIAGNOSIS — S0181XA Laceration without foreign body of other part of head, initial encounter: Secondary | ICD-10-CM | POA: Diagnosis not present

## 2016-12-14 DIAGNOSIS — Y92012 Bathroom of single-family (private) house as the place of occurrence of the external cause: Secondary | ICD-10-CM | POA: Diagnosis not present

## 2016-12-14 DIAGNOSIS — Z7901 Long term (current) use of anticoagulants: Secondary | ICD-10-CM | POA: Insufficient documentation

## 2016-12-14 DIAGNOSIS — W19XXXA Unspecified fall, initial encounter: Secondary | ICD-10-CM | POA: Insufficient documentation

## 2016-12-14 DIAGNOSIS — M542 Cervicalgia: Secondary | ICD-10-CM | POA: Diagnosis not present

## 2016-12-14 DIAGNOSIS — M25561 Pain in right knee: Secondary | ICD-10-CM | POA: Diagnosis not present

## 2016-12-14 DIAGNOSIS — S098XXA Other specified injuries of head, initial encounter: Secondary | ICD-10-CM | POA: Diagnosis not present

## 2016-12-14 DIAGNOSIS — Z85828 Personal history of other malignant neoplasm of skin: Secondary | ICD-10-CM | POA: Insufficient documentation

## 2016-12-14 DIAGNOSIS — S0083XA Contusion of other part of head, initial encounter: Secondary | ICD-10-CM | POA: Diagnosis not present

## 2016-12-14 DIAGNOSIS — R220 Localized swelling, mass and lump, head: Secondary | ICD-10-CM | POA: Diagnosis not present

## 2016-12-14 DIAGNOSIS — Y939 Activity, unspecified: Secondary | ICD-10-CM | POA: Insufficient documentation

## 2016-12-14 DIAGNOSIS — S0993XA Unspecified injury of face, initial encounter: Secondary | ICD-10-CM | POA: Diagnosis not present

## 2016-12-14 DIAGNOSIS — Z96651 Presence of right artificial knee joint: Secondary | ICD-10-CM | POA: Insufficient documentation

## 2016-12-14 DIAGNOSIS — M546 Pain in thoracic spine: Secondary | ICD-10-CM | POA: Diagnosis not present

## 2016-12-14 DIAGNOSIS — Z8542 Personal history of malignant neoplasm of other parts of uterus: Secondary | ICD-10-CM | POA: Diagnosis not present

## 2016-12-14 DIAGNOSIS — S8991XA Unspecified injury of right lower leg, initial encounter: Secondary | ICD-10-CM | POA: Diagnosis not present

## 2016-12-14 DIAGNOSIS — S0990XA Unspecified injury of head, initial encounter: Secondary | ICD-10-CM | POA: Diagnosis not present

## 2016-12-14 LAB — CBC WITH DIFFERENTIAL/PLATELET
BASOS PCT: 0 %
Basophils Absolute: 0 10*3/uL (ref 0.0–0.1)
Eosinophils Absolute: 0 10*3/uL (ref 0.0–0.7)
Eosinophils Relative: 0 %
HCT: 37.5 % (ref 36.0–46.0)
HEMOGLOBIN: 11.6 g/dL — AB (ref 12.0–15.0)
LYMPHS ABS: 0.7 10*3/uL (ref 0.7–4.0)
Lymphocytes Relative: 5 %
MCH: 28.6 pg (ref 26.0–34.0)
MCHC: 30.9 g/dL (ref 30.0–36.0)
MCV: 92.4 fL (ref 78.0–100.0)
MONOS PCT: 7 %
Monocytes Absolute: 0.9 10*3/uL (ref 0.1–1.0)
NEUTROS PCT: 88 %
Neutro Abs: 12.7 10*3/uL — ABNORMAL HIGH (ref 1.7–7.7)
Platelets: 208 10*3/uL (ref 150–400)
RBC: 4.06 MIL/uL (ref 3.87–5.11)
RDW: 14.4 % (ref 11.5–15.5)
WBC: 14.4 10*3/uL — AB (ref 4.0–10.5)

## 2016-12-14 LAB — COMPREHENSIVE METABOLIC PANEL
ALBUMIN: 3.8 g/dL (ref 3.5–5.0)
ALK PHOS: 86 U/L (ref 38–126)
ALT: 34 U/L (ref 14–54)
ANION GAP: 8 (ref 5–15)
AST: 43 U/L — AB (ref 15–41)
BILIRUBIN TOTAL: 0.8 mg/dL (ref 0.3–1.2)
BUN: 19 mg/dL (ref 6–20)
CALCIUM: 9.7 mg/dL (ref 8.9–10.3)
CO2: 26 mmol/L (ref 22–32)
CREATININE: 1.07 mg/dL — AB (ref 0.44–1.00)
Chloride: 106 mmol/L (ref 101–111)
GFR calc Af Amer: 54 mL/min — ABNORMAL LOW (ref >60)
GFR calc non Af Amer: 47 mL/min — ABNORMAL LOW (ref >60)
GLUCOSE: 110 mg/dL — AB (ref 65–99)
Potassium: 4.6 mmol/L (ref 3.5–5.1)
SODIUM: 140 mmol/L (ref 135–145)
TOTAL PROTEIN: 6.4 g/dL — AB (ref 6.5–8.1)

## 2016-12-14 LAB — I-STAT TROPONIN, ED: Troponin i, poc: 0 ng/mL (ref 0.00–0.08)

## 2016-12-14 MED ORDER — LIDOCAINE HCL (PF) 1 % IJ SOLN
5.0000 mL | Freq: Once | INTRAMUSCULAR | Status: AC
  Administered 2016-12-14: 5 mL via INTRADERMAL
  Filled 2016-12-14: qty 5

## 2016-12-14 MED ORDER — ACETAMINOPHEN 325 MG PO TABS
650.0000 mg | ORAL_TABLET | Freq: Once | ORAL | Status: AC
  Administered 2016-12-14: 650 mg via ORAL
  Filled 2016-12-14: qty 2

## 2016-12-14 MED ORDER — SODIUM CHLORIDE 0.9 % IV BOLUS (SEPSIS): 500.0000 mL | Freq: Once | INTRAVENOUS | Status: DC

## 2016-12-14 MED ORDER — SODIUM CHLORIDE 0.9 % IV BOLUS (SEPSIS)
1000.0000 mL | Freq: Once | INTRAVENOUS | Status: AC
  Administered 2016-12-14: 1000 mL via INTRAVENOUS

## 2016-12-14 NOTE — ED Notes (Signed)
PT and husband given a meal

## 2016-12-14 NOTE — ED Triage Notes (Signed)
PT arrives via EMS from home after an unwittnessed syncopal episode. PT was seen Jan. 11 for same. PT was scheduled to get a holter monitor placed today due to last syncopal episode. PT was found by husband. PT has laceration to forehead. PT take eliquis. PT had just finished bathing, dried off, PT started to dress herself and felt blood running down her leg from an old wound on leg. PT "ran to bathroom" to try to clean it. PT reports blood was puddling from wound. PT then felt dizzy, nauseated and woke up on the bathroom floor face down. PT woke up on floor and could not get up because the floor was slippery with blood.

## 2016-12-14 NOTE — ED Notes (Signed)
EDP at bedside  

## 2016-12-14 NOTE — ED Provider Notes (Signed)
[facility] DEPT Provider Note   CSN: [id number] Arrival date & time: 12/14/16  1606     History   Chief Complaint Chief Complaint  Patient presents with  . Loss of Consciousness    HPI Elizabeth Everett is a 83 y.o. female.  HPI   Following January 11 after unwitnessed syncopal episode.  2 weeks ago hit her lower leg on a planter, and had bleeding to the area. She's had the wound evaluated by her primary care physician and was initiated on doxycycline and Rocephin after she developed erythema of the lower leg surrounding the wound. Patient reports she's had improvement of the wound since then. Today she was behaving, and felt that this softened the scar that was forming, and the area started spontaneously bleeding. Reports she had a significant amount of blood loss, that it was bleeding rapidly, however not pulsating. Has been reports approximately 2 cups of blood loss, however it appeared to be a lot all over the bathroom. Patient reports that she was bleeding and looking at this, and felt lightheaded, nauseous, and then woke up on the bathroom floor face down. Reports headache, facial pain, right knee pain. The floor was slippery with blood and she is unable to stand. Denies any associated chest pain, shortness of breath. Denies any other recent illnesses, including no black or bloody stools, no cough, no fevers, no numbness, weakness, change in vision.  Past Medical History:  Diagnosis Date  . Acute respiratory failure with hypoxia and hypercapnia (HCC)   . Arthritis   . Atrial fibrillation ([place])   . Baker's cyst of knee    right   . Bradycardia   . Cancer ([place])    skin cancer; endometrial cancer   . Colonic pseudoobstruction   . Dizziness   . Dysrhythmia   . Edema of lower extremity    bilat   . GERD (gastroesophageal reflux disease)   . Hard of hearing   . Heart murmur   . History of atrial flutter   . History of bronchitis   . History of kidney stones   .  Hypercholesteremia   . Hypertension   . Macular degeneration   . Multiple falls   . Pancreatitis   . Scoliosis   . Shortness of breath dyspnea    can not climb full set of steps without stopping   . Toxic metabolic encephalopathy     Patient Active Problem List   Diagnosis Date Noted  . S/P right TKA 12/01/2015  . Idiopathic acute pancreatitis 06/16/2015    Past Surgical History:  Procedure Laterality Date  . ABDOMINAL HYSTERECTOMY    . CARDIOVERSION    . DILATION AND CURETTAGE OF UTERUS    . FOOT SURGERY     bone surgery  . KNEE ARTHROSCOPY Right   . TONSILLECTOMY    . TOTAL KNEE ARTHROPLASTY Right 12/01/2015   Procedure: RIGHT TOTAL KNEE ARTHROPLASTY;  Surgeon: Paralee Cancel, MD;  Location: [facility] ORS;  Service: Orthopedics;  Laterality: Right;  Marland Kitchen VAGINAL HYSTERECTOMY      OB History    No data available       Home Medications    Prior to Admission medications   Medication Sig Start Date End Date Taking? Authorizing Provider  amiodarone (PACERONE) 200 MG tablet Take 200 mg by mouth daily.   Yes Historical Provider, MD  apixaban (ELIQUIS) 5 MG TABS tablet Take 5 mg by mouth daily.   Yes Historical Provider, MD  Cyanocobalamin (VITAMIN B-12 PO)  Take 1 tablet by mouth daily.   Yes Historical Provider, MD  doxycycline (VIBRA-TABS) 100 MG tablet Take 100 mg by mouth 2 (two) times daily. Started on 12-13-16   Yes Historical Provider, MD  FOLIC ACID PO Take 1 tablet by mouth daily.   Yes Historical Provider, MD  furosemide (LASIX) 20 MG tablet Take 20 mg by mouth every other day.  05/27/15  Yes Historical Provider, MD  HYDROcodone-acetaminophen (NORCO) 7.5-325 MG tablet Take 1-2 tablets by mouth every 4 (four) hours as needed for moderate pain. 12/02/15  Yes Matthew Babish, PA-C  irbesartan (AVAPRO) 150 MG tablet Take 75 mg by mouth daily. 08/18/09  Yes Historical Provider, MD  KLOR-CON 10 10 MEQ tablet Take 10 mEq by mouth every other day.  05/11/15  Yes Historical Provider, MD    metoprolol (LOPRESSOR) 50 MG tablet Take 25 mg by mouth 2 (two) times daily. 07/18/12  Yes Historical Provider, MD  Multiple Vitamins-Minerals (EYE VITAMINS PO) Take 2 tablets by mouth daily.    Yes Historical Provider, MD  mupirocin ointment (BACTROBAN) 2 % Place 1 application into the nose 2 (two) times daily. Apply to leg wound   Yes Historical Provider, MD  pantoprazole (PROTONIX) 40 MG tablet Take 40 mg by mouth daily. 05/25/15  Yes Historical Provider, MD  Pyridoxine HCl (VITAMIN B-6 PO) Take 1 tablet by mouth daily.   Yes Historical Provider, MD  simvastatin (ZOCOR) 20 MG tablet Take 20 mg by mouth daily. 08/18/09  Yes Historical Provider, MD  VITAMIN E PO Take 1 capsule by mouth every other day.   Yes Historical Provider, MD  diazepam (VALIUM) 5 MG tablet Take 1 tablet (5 mg total) by mouth as directed. Take 1/2 hour prior to appointment and another if needed. Patient not taking: Reported on 11/18/2015 08/18/15   Milus Banister, MD  docusate sodium (COLACE) 100 MG capsule Take 1 capsule (100 mg total) by mouth 2 (two) times daily. Patient not taking: Reported on 12/14/2016 12/02/15   Danae Orleans, PA-C  ferrous sulfate 325 (65 FE) MG tablet Take 1 tablet (325 mg total) by mouth 3 (three) times daily after meals. Patient not taking: Reported on 12/14/2016 12/02/15   Danae Orleans, PA-C  polyethylene glycol (MIRALAX / Floria Raveling) packet Take 17 g by mouth 2 (two) times daily. Patient not taking: Reported on 12/14/2016 12/02/15   Danae Orleans, PA-C  tiZANidine (ZANAFLEX) 4 MG tablet Take 1 tablet (4 mg total) by mouth every 6 (six) hours as needed for muscle spasms. Patient not taking: Reported on 12/14/2016 12/02/15   Danae Orleans, PA-C    Family History Family History  Problem Relation Age of Onset  . Lymphoma Father   . Macular degeneration Father   . Macular degeneration Mother     Social History Social History  Substance Use Topics  . Smoking status: Never Smoker  . Smokeless  tobacco: Never Used  . Alcohol use 0.0 oz/week     Comment: 3 glasses of wine monthly     Allergies   Evista [raloxifene]; Morphine; Statins; Erythromycin; and Gentamicin   Review of Systems Review of Systems  Constitutional: Negative for appetite change and fever.  HENT: Negative for sore throat.   Eyes: Negative for visual disturbance.  Respiratory: Negative for cough and shortness of breath.   Cardiovascular: Negative for chest pain.  Gastrointestinal: Positive for nausea (just prior to fall). Negative for abdominal pain, diarrhea and vomiting.  Genitourinary: Negative for difficulty urinating and dysuria.  Musculoskeletal:  Positive for arthralgias, back pain (upper back/neck) and neck pain.  Skin: Positive for wound. Negative for rash.  Neurological: Positive for syncope, light-headedness and headaches. Negative for facial asymmetry, weakness and numbness.     Physical Exam Updated Vital Signs BP (!) 133/51   Pulse 63   Temp 97.8 F (36.6 C) (Oral)   Resp 14   Ht 5' (1.524 m)   Wt 152 lb (68.9 kg)   SpO2 99%   BMI 29.69 kg/m   Physical Exam  Constitutional: She is oriented to person, place, and time. She appears well-developed and well-nourished. No distress.  HENT:  Head: Normocephalic.  Mouth/Throat: No oropharyngeal exudate.  Left facial and periorbital contusions Laceration 2cm x2cm  Eyes: Conjunctivae and EOM are normal. Pupils are equal, round, and reactive to light.  Neck: Normal range of motion.  Cardiovascular: Normal rate, regular rhythm, normal heart sounds and intact distal pulses.  Exam reveals no gallop and no friction rub.   No murmur heard. Pulmonary/Chest: Effort normal and breath sounds normal. No respiratory distress. She has no wheezes. She has no rales.  Abdominal: Soft. She exhibits no distension. There is no tenderness. There is no guarding.  Musculoskeletal: She exhibits no edema.       Right knee: She exhibits ecchymosis. Tenderness  found.       Left knee: She exhibits ecchymosis. No tenderness found.  Neurological: She is alert and oriented to person, place, and time. She has normal strength. No cranial nerve deficit or sensory deficit. GCS eye subscore is 4. GCS verbal subscore is 5. GCS motor subscore is 6.  Skin: Skin is warm and dry. No rash noted. She is not diaphoretic. No erythema.  Wound x2 right lower extremity skin tear, no active bleeding, mild surrounding erythema  Nursing note and vitals reviewed.    ED Treatments / Results  Labs (all labs ordered are listed, but only abnormal results are displayed) Labs Reviewed  CBC WITH DIFFERENTIAL/PLATELET - Abnormal; Notable for the following:       Result Value   WBC 14.4 (*)    Hemoglobin 11.6 (*)    Neutro Abs 12.7 (*)    All other components within normal limits  COMPREHENSIVE METABOLIC PANEL - Abnormal; Notable for the following:    Glucose, Bld 110 (*)    Creatinine, Ser 1.07 (*)    Total Protein 6.4 (*)    AST 43 (*)    GFR calc non Af Amer 47 (*)    GFR calc Af Amer 54 (*)    All other components within normal limits  I-STAT TROPOININ, ED    EKG  EKG Interpretation  Date/Time:  Wednesday December 14 2016 16:18:18 EST Ventricular Rate:  49 PR Interval:    QRS Duration: 91 QT Interval:  505 QTC Calculation: 456 R Axis:   17 Text Interpretation:  Sinus bradycardia Atrial premature complex No previous ECGs available Confirmed by [facility] MD, Glorianna Gott ([id number]) on 12/14/2016 5:13:32 PM       Radiology Dg Thoracic Spine 2 View  Result Date: 12/14/2016 CLINICAL DATA:  [place] on the floor of the bathroom today.  Back pain. EXAM: THORACIC SPINE 2 VIEWS COMPARISON:  Chest radiography 10/03/2011 FINDINGS: Thoracolumbar curvature convex to the right in the thoracic region and to the left in the lumbar region. No compression fracture is definitely seen. Fractures can be quite subtle in cases of curvature because of all parallel lateral imaging. One could  question a partial fracture at T7. IMPRESSION:  Pronounced thoracic curvature. Possible thoracic compression fracture at T7 with minimal loss of height. This is not definite, as the lateral view is off axis because of the spinal curvature. Electronically Signed   By: Nelson Chimes M.D.   On: 12/14/2016 18:16   Ct Head Wo Contrast  Result Date: 12/14/2016 CLINICAL DATA:  Pain after fall EXAM: CT HEAD WITHOUT CONTRAST CT MAXILLOFACIAL WITHOUT CONTRAST CT CERVICAL SPINE WITHOUT CONTRAST TECHNIQUE: Multidetector CT imaging of the head, cervical spine, and maxillofacial structures were performed using the standard protocol without intravenous contrast. Multiplanar CT image reconstructions of the cervical spine and maxillofacial structures were also generated. COMPARISON:  11/10/2016 CT FINDINGS: CT HEAD FINDINGS BRAIN: Mild stable degree of periventricular and subcortical white matter hypodensities consistent with chronic small vessel ischemic disease. No acute large vascular territory infarcts. No abnormal extra-axial fluid collections. Basal cisterns are not effaced and midline. VASCULAR: Moderate calcific atherosclerosis of the carotid siphons. Ectatic left vertebral artery with calcification. SKULL: No skull fracture.  Left mild forehead soft tissue swelling. SINUSES/ORBITS: The mastoid air-cells are clear. The included paranasal sinuses are well-aerated.The included ocular globes and orbital contents are non-suspicious. OTHER: None. CT MAXILLOFACIAL FINDINGS Osseous: No fracture or mandibular dislocation. No destructive process. Chronic leftward deviation of the nasal septum with leftward nasal septal spur. Orbits: Negative.  No posttraumatic or inflammatory findings. Sinuses: Clear Soft tissues: Left premalar, periorbital and left forehead soft tissue swelling. CT CERVICAL SPINE FINDINGS Alignment: Normal Skull base and vertebrae: Arachnoid granulation in the occipital skull. No cerebellar tonsillar ectopia. No  acute cervical spine fracture. Soft tissues and spinal canal: No prevertebral fluid or swelling. No visible canal hematoma. Disc levels: Cervical spondylosis with disc space narrowing most prominent from C3 through C7 and at T2-3 with bilateral facet arthropathy characterized by joint space narrowing, sclerosis and mild hypertrophy. Slight retrolisthesis of C3 on C4 by 3 mm, likely degenerative. Mild right C3-4 and C4-5 neural foraminal encroachment from osteophytes and mild left C5-6 neural foraminal encroachment. Bilateral uncovertebral joint spurring at C3-4, C4-5, C5-6 and C6-7. Upper chest: Negative Other: None IMPRESSION: 1. Left forehead, periorbital and premalar soft tissue swelling without underlying fracture. 2. 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 No acute intracranial abnormality. 3. No acute cervical spinal fracture or subluxation. Cervical spondylosis. Electronically Signed   By: Ashley Royalty M.D.   On: 12/14/2016 18:02   Ct Cervical Spine Wo Contrast  Result Date: 12/14/2016 CLINICAL DATA:  Pain after fall EXAM: CT HEAD WITHOUT CONTRAST CT MAXILLOFACIAL WITHOUT CONTRAST CT CERVICAL SPINE WITHOUT CONTRAST TECHNIQUE: Multidetector CT imaging of the head, cervical spine, and maxillofacial structures were performed using the standard protocol without intravenous contrast. Multiplanar CT image reconstructions of the cervical spine and maxillofacial structures were also generated. COMPARISON:  11/10/2016 CT FINDINGS: CT HEAD FINDINGS BRAIN: Mild stable degree of periventricular and subcortical white matter hypodensities consistent with chronic small vessel ischemic disease. No acute large vascular territory infarcts. No abnormal extra-axial fluid collections. Basal cisterns are not effaced and midline. VASCULAR: Moderate calcific atherosclerosis of the carotid siphons. Ectatic left vertebral artery with calcification. SKULL: No skull fracture.  Left mild  forehead soft tissue swelling. SINUSES/ORBITS: The mastoid air-cells are clear. The included paranasal sinuses are well-aerated.The included ocular globes and orbital contents are non-suspicious. OTHER: None. CT MAXILLOFACIAL FINDINGS Osseous: No fracture or mandibular dislocation. No destructive process. Chronic leftward deviation of the nasal septum with leftward nasal septal spur. Orbits: Negative.  No posttraumatic or inflammatory findings. Sinuses:  Clear Soft tissues: Left premalar, periorbital and left forehead soft tissue swelling. CT CERVICAL SPINE FINDINGS Alignment: Normal Skull base and vertebrae: Arachnoid granulation in the occipital skull. No cerebellar tonsillar ectopia. No acute cervical spine fracture. Soft tissues and spinal canal: No prevertebral fluid or swelling. No visible canal hematoma. Disc levels: Cervical spondylosis with disc space narrowing most prominent from C3 through C7 and at T2-3 with bilateral facet arthropathy characterized by joint space narrowing, sclerosis and mild hypertrophy. Slight retrolisthesis of C3 on C4 by 3 mm, likely degenerative. Mild right C3-4 and C4-5 neural foraminal encroachment from osteophytes and mild left C5-6 neural foraminal encroachment. Bilateral uncovertebral joint spurring at C3-4, C4-5, C5-6 and C6-7. Upper chest: Negative Other: None IMPRESSION: 1. Left forehead, periorbital and premalar soft tissue swelling without underlying fracture. 2. 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 No acute intracranial abnormality. 3. No acute cervical spinal fracture or subluxation. Cervical spondylosis. Electronically Signed   By: Ashley Royalty M.D.   On: 12/14/2016 18:02   Dg Knee Complete 4 Views Right  Result Date: 12/14/2016 CLINICAL DATA:  [place] in the bathroom at home today. Knee pain. Previous knee replacement. EXAM: RIGHT KNEE - COMPLETE 4+ VIEW COMPARISON:  11/10/2016 FINDINGS: Previous total knee arthroplasty.  Good appearance. No traumatic finding. IMPRESSION: Negative Electronically Signed   By: Nelson Chimes M.D.   On: 12/14/2016 18:17   Ct Maxillofacial Wo Contrast  Result Date: 12/14/2016 CLINICAL DATA:  Pain after fall EXAM: CT HEAD WITHOUT CONTRAST CT MAXILLOFACIAL WITHOUT CONTRAST CT CERVICAL SPINE WITHOUT CONTRAST TECHNIQUE: Multidetector CT imaging of the head, cervical spine, and maxillofacial structures were performed using the standard protocol without intravenous contrast. Multiplanar CT image reconstructions of the cervical spine and maxillofacial structures were also generated. COMPARISON:  11/10/2016 CT FINDINGS: CT HEAD FINDINGS BRAIN: Mild stable degree of periventricular and subcortical white matter hypodensities consistent with chronic small vessel ischemic disease. No acute large vascular territory infarcts. No abnormal extra-axial fluid collections. Basal cisterns are not effaced and midline. VASCULAR: Moderate calcific atherosclerosis of the carotid siphons. Ectatic left vertebral artery with calcification. SKULL: No skull fracture.  Left mild forehead soft tissue swelling. SINUSES/ORBITS: The mastoid air-cells are clear. The included paranasal sinuses are well-aerated.The included ocular globes and orbital contents are non-suspicious. OTHER: None. CT MAXILLOFACIAL FINDINGS Osseous: No fracture or mandibular dislocation. No destructive process. Chronic leftward deviation of the nasal septum with leftward nasal septal spur. Orbits: Negative.  No posttraumatic or inflammatory findings. Sinuses: Clear Soft tissues: Left premalar, periorbital and left forehead soft tissue swelling. CT CERVICAL SPINE FINDINGS Alignment: Normal Skull base and vertebrae: Arachnoid granulation in the occipital skull. No cerebellar tonsillar ectopia. No acute cervical spine fracture. Soft tissues and spinal canal: No prevertebral fluid or swelling. No visible canal hematoma. Disc levels: Cervical spondylosis with disc  space narrowing most prominent from C3 through C7 and at T2-3 with bilateral facet arthropathy characterized by joint space narrowing, sclerosis and mild hypertrophy. Slight retrolisthesis of C3 on C4 by 3 mm, likely degenerative. Mild right C3-4 and C4-5 neural foraminal encroachment from osteophytes and mild left C5-6 neural foraminal encroachment. Bilateral uncovertebral joint spurring at C3-4, C4-5, C5-6 and C6-7. Upper chest: Negative Other: None IMPRESSION: 1. Left forehead, periorbital and premalar soft tissue swelling without underlying fracture. 2. 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 No acute intracranial abnormality. 3. No acute cervical spinal fracture or subluxation. Cervical spondylosis. Electronically Signed   By: Meredith Leeds.D.  On: 12/14/2016 18:02    Procedures Procedures (including critical care time)  Medications Ordered in ED Medications  lidocaine (PF) (XYLOCAINE) 1 % injection 5 mL (5 mLs Intradermal Given by Other 12/14/16 2000)  acetaminophen (TYLENOL) tablet 650 mg (650 mg Oral Given 12/14/16 2128)  sodium chloride 0.9 % bolus 1,000 mL (0 mLs Intravenous Stopped 12/14/16 2341)     Initial Impression / Assessment and Plan / ED Course  I have reviewed the triage vital signs and the nursing notes.  Pertinent labs & imaging results that were available during my care of the patient were reviewed by me and considered in my medical decision making (see chart for details).    83 year old female with a history of atrial fibrillation on eliquis, hypertension, hyperlipidemia, syncopal episodes presents with concern for bleeding from her right lower extremity from a leg wound, followed by lightheadedness, syncope and fall.  Regarding fall, CT head, face, cervical spine show no acute abnormalities. X-ray of the knee without acute findings, and patient able to extend the knee and doubt other patellar injury. X-ray of the thoracic spine  showed possible compression at T7, however area of tenderness is much more superior, and have low suspicion this represents represents acute fracture.    She does have a wound to her left forehead. This area was numbed with lidocaine, and explored and irrigated. No violation of galea.  Tissue has been crushed or removed in center of area and pt with wound that is not amenable to repair and recommend supportive care and healing by secondary intention.  Regarding syncopal episode, suspect vasovagal in setting of bleeding from leg wound.  She did not have palpitations, nor CP, nor dyspnea preceding event and doubt ACS or PE.  Denies infectious symptoms and feel leukocytosis likely reactive from fall or secondary to recent cellulitis for which she is taking doxycycline with improvement.  EKG unchanged from prior.  Reports significant amount of bleeding followed by sensation of lightheadedness then syncope and feel this is likely vagal or dehydration. Pt initially declined IV fluids.  Prior to discharge, pt stood up after laying in bed for many hours and had lightheadedness and orthostatic blood pressure changes.  Feel this is likely in setting of dehydration. Given IV fluids and orthostatic changes resolved. Patient feels at baseline. I feel given context of these symptoms, she is appropriate for outpatient follow up, continued evaluation by Cardiology.  Patient discharged in stable condition with understanding of reasons to return.     Final Clinical Impressions(s) / ED Diagnoses   Final diagnoses:  Laceration of forehead, initial encounter  Syncope, unspecified syncope type, suspect vasovagal however recommend continued Cardiology follow up  Contusion of face, initial encounter    New Prescriptions Discharge Medication List as of 12/14/2016  9:06 PM       Gareth Morgan, MD 12/15/16 0145

## 2016-12-14 NOTE — ED Notes (Signed)
Care handoff to Crystal RN 

## 2016-12-14 NOTE — ED Notes (Signed)
ED Provider at bedside. 

## 2016-12-14 NOTE — ED Notes (Signed)
Patient transported to CT 

## 2016-12-14 NOTE — ED Notes (Signed)
PT assisted into wheelchair and taken to bathroom. PT reports she is dizzy when standing. PT able to use restroom. PT returned to wheelchair. PT stands and BP is taken, BP is 97/47. PT reports she feels as though she might pass out. PT returned to chair. Dr. Billy Fischer made aware.

## 2016-12-15 DIAGNOSIS — R55 Syncope and collapse: Secondary | ICD-10-CM | POA: Diagnosis not present

## 2016-12-15 DIAGNOSIS — S0181XS Laceration without foreign body of other part of head, sequela: Secondary | ICD-10-CM | POA: Diagnosis not present

## 2016-12-15 DIAGNOSIS — W19XXXD Unspecified fall, subsequent encounter: Secondary | ICD-10-CM | POA: Diagnosis not present

## 2016-12-15 DIAGNOSIS — S0012XS Contusion of left eyelid and periocular area, sequela: Secondary | ICD-10-CM | POA: Diagnosis not present

## 2016-12-16 DIAGNOSIS — R55 Syncope and collapse: Secondary | ICD-10-CM | POA: Diagnosis not present

## 2016-12-16 DIAGNOSIS — I480 Paroxysmal atrial fibrillation: Secondary | ICD-10-CM | POA: Diagnosis not present

## 2016-12-19 DIAGNOSIS — I480 Paroxysmal atrial fibrillation: Secondary | ICD-10-CM | POA: Diagnosis not present

## 2016-12-20 DIAGNOSIS — S0180XD Unspecified open wound of other part of head, subsequent encounter: Secondary | ICD-10-CM | POA: Diagnosis not present

## 2016-12-20 DIAGNOSIS — S0101XA Laceration without foreign body of scalp, initial encounter: Secondary | ICD-10-CM | POA: Diagnosis not present

## 2016-12-20 DIAGNOSIS — S81801D Unspecified open wound, right lower leg, subsequent encounter: Secondary | ICD-10-CM | POA: Diagnosis not present

## 2016-12-20 DIAGNOSIS — S81811A Laceration without foreign body, right lower leg, initial encounter: Secondary | ICD-10-CM | POA: Diagnosis not present

## 2016-12-23 ENCOUNTER — Ambulatory Visit
Admission: RE | Admit: 2016-12-23 | Discharge: 2016-12-23 | Disposition: A | Discharge status: Home / Self Care | Payer: Medicare Other | Source: Ambulatory Visit | Attending: Family Medicine | Admitting: Family Medicine

## 2016-12-23 DIAGNOSIS — Z1231 Encounter for screening mammogram for malignant neoplasm of breast: Secondary | ICD-10-CM

## 2016-12-27 DIAGNOSIS — S0180XA Unspecified open wound of other part of head, initial encounter: Secondary | ICD-10-CM | POA: Diagnosis not present

## 2016-12-27 DIAGNOSIS — S0181XA Laceration without foreign body of other part of head, initial encounter: Secondary | ICD-10-CM | POA: Diagnosis not present

## 2016-12-27 DIAGNOSIS — S81801A Unspecified open wound, right lower leg, initial encounter: Secondary | ICD-10-CM | POA: Diagnosis not present

## 2016-12-27 DIAGNOSIS — S81811A Laceration without foreign body, right lower leg, initial encounter: Secondary | ICD-10-CM | POA: Diagnosis not present

## 2017-01-06 DIAGNOSIS — Z09 Encounter for follow-up examination after completed treatment for conditions other than malignant neoplasm: Secondary | ICD-10-CM | POA: Diagnosis not present

## 2017-01-06 DIAGNOSIS — S0180XD Unspecified open wound of other part of head, subsequent encounter: Secondary | ICD-10-CM | POA: Diagnosis not present

## 2017-01-06 DIAGNOSIS — S81801D Unspecified open wound, right lower leg, subsequent encounter: Secondary | ICD-10-CM | POA: Diagnosis not present

## 2017-01-10 DIAGNOSIS — I480 Paroxysmal atrial fibrillation: Secondary | ICD-10-CM | POA: Diagnosis not present

## 2017-02-01 DIAGNOSIS — I10 Essential (primary) hypertension: Secondary | ICD-10-CM | POA: Diagnosis not present

## 2017-02-01 DIAGNOSIS — G4733 Obstructive sleep apnea (adult) (pediatric): Secondary | ICD-10-CM | POA: Diagnosis not present

## 2017-02-01 DIAGNOSIS — I483 Typical atrial flutter: Secondary | ICD-10-CM | POA: Diagnosis not present

## 2017-02-01 DIAGNOSIS — I480 Paroxysmal atrial fibrillation: Secondary | ICD-10-CM | POA: Diagnosis not present

## 2017-03-01 DIAGNOSIS — E784 Other hyperlipidemia: Secondary | ICD-10-CM | POA: Diagnosis not present

## 2017-03-01 DIAGNOSIS — I480 Paroxysmal atrial fibrillation: Secondary | ICD-10-CM | POA: Diagnosis not present

## 2017-03-01 DIAGNOSIS — R0989 Other specified symptoms and signs involving the circulatory and respiratory systems: Secondary | ICD-10-CM | POA: Diagnosis not present

## 2017-03-01 DIAGNOSIS — G4733 Obstructive sleep apnea (adult) (pediatric): Secondary | ICD-10-CM | POA: Diagnosis not present

## 2017-03-01 DIAGNOSIS — I10 Essential (primary) hypertension: Secondary | ICD-10-CM | POA: Diagnosis not present

## 2017-03-08 DIAGNOSIS — Z08 Encounter for follow-up examination after completed treatment for malignant neoplasm: Secondary | ICD-10-CM | POA: Diagnosis not present

## 2017-03-08 DIAGNOSIS — L820 Inflamed seborrheic keratosis: Secondary | ICD-10-CM | POA: Diagnosis not present

## 2017-03-08 DIAGNOSIS — Z85828 Personal history of other malignant neoplasm of skin: Secondary | ICD-10-CM | POA: Diagnosis not present

## 2017-04-10 DIAGNOSIS — I872 Venous insufficiency (chronic) (peripheral): Secondary | ICD-10-CM | POA: Diagnosis not present

## 2017-04-10 DIAGNOSIS — R42 Dizziness and giddiness: Secondary | ICD-10-CM | POA: Diagnosis not present

## 2017-04-10 DIAGNOSIS — I482 Chronic atrial fibrillation: Secondary | ICD-10-CM | POA: Diagnosis not present

## 2017-04-10 DIAGNOSIS — I10 Essential (primary) hypertension: Secondary | ICD-10-CM | POA: Diagnosis not present

## 2017-05-09 DIAGNOSIS — H353232 Exudative age-related macular degeneration, bilateral, with inactive choroidal neovascularization: Secondary | ICD-10-CM | POA: Diagnosis not present

## 2017-05-09 DIAGNOSIS — H2513 Age-related nuclear cataract, bilateral: Secondary | ICD-10-CM | POA: Diagnosis not present

## 2017-05-09 DIAGNOSIS — H43813 Vitreous degeneration, bilateral: Secondary | ICD-10-CM | POA: Diagnosis not present

## 2017-05-16 DIAGNOSIS — R030 Elevated blood-pressure reading, without diagnosis of hypertension: Secondary | ICD-10-CM | POA: Diagnosis not present

## 2017-06-02 DIAGNOSIS — I10 Essential (primary) hypertension: Secondary | ICD-10-CM | POA: Diagnosis not present

## 2017-08-04 DIAGNOSIS — H31013 Macula scars of posterior pole (postinflammatory) (post-traumatic), bilateral: Secondary | ICD-10-CM | POA: Diagnosis not present

## 2017-08-04 DIAGNOSIS — H353233 Exudative age-related macular degeneration, bilateral, with inactive scar: Secondary | ICD-10-CM | POA: Diagnosis not present

## 2017-08-04 DIAGNOSIS — H35363 Drusen (degenerative) of macula, bilateral: Secondary | ICD-10-CM | POA: Diagnosis not present

## 2017-08-04 DIAGNOSIS — H2513 Age-related nuclear cataract, bilateral: Secondary | ICD-10-CM | POA: Diagnosis not present

## 2017-08-15 DIAGNOSIS — E785 Hyperlipidemia, unspecified: Secondary | ICD-10-CM | POA: Diagnosis not present

## 2017-08-15 DIAGNOSIS — R55 Syncope and collapse: Secondary | ICD-10-CM | POA: Diagnosis not present

## 2017-08-15 DIAGNOSIS — Z7901 Long term (current) use of anticoagulants: Secondary | ICD-10-CM | POA: Diagnosis not present

## 2017-08-15 DIAGNOSIS — I480 Paroxysmal atrial fibrillation: Secondary | ICD-10-CM | POA: Diagnosis not present

## 2017-08-15 DIAGNOSIS — I10 Essential (primary) hypertension: Secondary | ICD-10-CM | POA: Diagnosis not present

## 2017-08-23 DIAGNOSIS — Z23 Encounter for immunization: Secondary | ICD-10-CM | POA: Diagnosis not present

## 2017-09-13 DIAGNOSIS — Z85828 Personal history of other malignant neoplasm of skin: Secondary | ICD-10-CM | POA: Diagnosis not present

## 2017-09-13 DIAGNOSIS — L570 Actinic keratosis: Secondary | ICD-10-CM | POA: Diagnosis not present

## 2017-09-13 DIAGNOSIS — Z08 Encounter for follow-up examination after completed treatment for malignant neoplasm: Secondary | ICD-10-CM | POA: Diagnosis not present

## 2017-09-15 DIAGNOSIS — S3991XA Unspecified injury of abdomen, initial encounter: Secondary | ICD-10-CM | POA: Diagnosis not present

## 2017-09-15 DIAGNOSIS — R3129 Other microscopic hematuria: Secondary | ICD-10-CM | POA: Diagnosis present

## 2017-09-15 DIAGNOSIS — K219 Gastro-esophageal reflux disease without esophagitis: Secondary | ICD-10-CM | POA: Diagnosis present

## 2017-09-15 DIAGNOSIS — S0990XA Unspecified injury of head, initial encounter: Secondary | ICD-10-CM | POA: Diagnosis not present

## 2017-09-15 DIAGNOSIS — I480 Paroxysmal atrial fibrillation: Secondary | ICD-10-CM | POA: Diagnosis present

## 2017-09-15 DIAGNOSIS — M542 Cervicalgia: Secondary | ICD-10-CM | POA: Diagnosis not present

## 2017-09-15 DIAGNOSIS — S300XXA Contusion of lower back and pelvis, initial encounter: Secondary | ICD-10-CM | POA: Diagnosis present

## 2017-09-15 DIAGNOSIS — R1011 Right upper quadrant pain: Secondary | ICD-10-CM | POA: Diagnosis not present

## 2017-09-15 DIAGNOSIS — R079 Chest pain, unspecified: Secondary | ICD-10-CM | POA: Diagnosis not present

## 2017-09-15 DIAGNOSIS — J942 Hemothorax: Secondary | ICD-10-CM | POA: Diagnosis not present

## 2017-09-15 DIAGNOSIS — R402412 Glasgow coma scale score 13-15, at arrival to emergency department: Secondary | ICD-10-CM | POA: Diagnosis present

## 2017-09-15 DIAGNOSIS — S27321A Contusion of lung, unilateral, initial encounter: Secondary | ICD-10-CM | POA: Diagnosis present

## 2017-09-15 DIAGNOSIS — R0789 Other chest pain: Secondary | ICD-10-CM | POA: Diagnosis not present

## 2017-09-15 DIAGNOSIS — S301XXA Contusion of abdominal wall, initial encounter: Secondary | ICD-10-CM | POA: Diagnosis present

## 2017-09-15 DIAGNOSIS — D1431 Benign neoplasm of right bronchus and lung: Secondary | ICD-10-CM | POA: Diagnosis not present

## 2017-09-15 DIAGNOSIS — J90 Pleural effusion, not elsewhere classified: Secondary | ICD-10-CM | POA: Diagnosis not present

## 2017-09-15 DIAGNOSIS — D649 Anemia, unspecified: Secondary | ICD-10-CM | POA: Diagnosis not present

## 2017-09-15 DIAGNOSIS — S3993XA Unspecified injury of pelvis, initial encounter: Secondary | ICD-10-CM | POA: Diagnosis not present

## 2017-09-15 DIAGNOSIS — S270XXA Traumatic pneumothorax, initial encounter: Secondary | ICD-10-CM | POA: Diagnosis present

## 2017-09-15 DIAGNOSIS — R109 Unspecified abdominal pain: Secondary | ICD-10-CM | POA: Diagnosis not present

## 2017-09-15 DIAGNOSIS — D62 Acute posthemorrhagic anemia: Secondary | ICD-10-CM | POA: Diagnosis present

## 2017-09-15 DIAGNOSIS — R102 Pelvic and perineal pain: Secondary | ICD-10-CM | POA: Diagnosis not present

## 2017-09-15 DIAGNOSIS — Z7901 Long term (current) use of anticoagulants: Secondary | ICD-10-CM | POA: Diagnosis not present

## 2017-09-15 DIAGNOSIS — Z9889 Other specified postprocedural states: Secondary | ICD-10-CM | POA: Diagnosis not present

## 2017-09-15 DIAGNOSIS — T17890A Other foreign object in other parts of respiratory tract causing asphyxiation, initial encounter: Secondary | ICD-10-CM | POA: Diagnosis present

## 2017-09-15 DIAGNOSIS — S2231XA Fracture of one rib, right side, initial encounter for closed fracture: Secondary | ICD-10-CM | POA: Diagnosis present

## 2017-09-15 DIAGNOSIS — J9811 Atelectasis: Secondary | ICD-10-CM | POA: Diagnosis present

## 2017-09-15 DIAGNOSIS — I10 Essential (primary) hypertension: Secondary | ICD-10-CM | POA: Diagnosis present

## 2017-09-15 DIAGNOSIS — S199XXA Unspecified injury of neck, initial encounter: Secondary | ICD-10-CM | POA: Diagnosis not present

## 2017-09-15 DIAGNOSIS — R197 Diarrhea, unspecified: Secondary | ICD-10-CM | POA: Diagnosis not present

## 2017-09-15 DIAGNOSIS — S2220XA Unspecified fracture of sternum, initial encounter for closed fracture: Secondary | ICD-10-CM | POA: Diagnosis not present

## 2017-09-15 DIAGNOSIS — S299XXA Unspecified injury of thorax, initial encounter: Secondary | ICD-10-CM | POA: Diagnosis not present

## 2017-09-15 DIAGNOSIS — J9819 Other pulmonary collapse: Secondary | ICD-10-CM | POA: Diagnosis not present

## 2017-09-15 DIAGNOSIS — Z8659 Personal history of other mental and behavioral disorders: Secondary | ICD-10-CM | POA: Diagnosis not present

## 2017-09-15 DIAGNOSIS — E785 Hyperlipidemia, unspecified: Secondary | ICD-10-CM | POA: Diagnosis present

## 2017-09-15 DIAGNOSIS — R51 Headache: Secondary | ICD-10-CM | POA: Diagnosis not present

## 2017-09-15 DIAGNOSIS — S20211A Contusion of right front wall of thorax, initial encounter: Secondary | ICD-10-CM | POA: Diagnosis present

## 2017-09-20 DIAGNOSIS — S2231XA Fracture of one rib, right side, initial encounter for closed fracture: Secondary | ICD-10-CM | POA: Diagnosis not present

## 2017-09-20 DIAGNOSIS — J9811 Atelectasis: Secondary | ICD-10-CM | POA: Diagnosis not present

## 2017-09-20 DIAGNOSIS — S270XXA Traumatic pneumothorax, initial encounter: Secondary | ICD-10-CM | POA: Diagnosis not present

## 2017-09-20 DIAGNOSIS — S300XXA Contusion of lower back and pelvis, initial encounter: Secondary | ICD-10-CM | POA: Diagnosis not present

## 2017-09-20 DIAGNOSIS — D62 Acute posthemorrhagic anemia: Secondary | ICD-10-CM | POA: Diagnosis not present

## 2017-09-20 DIAGNOSIS — S301XXA Contusion of abdominal wall, initial encounter: Secondary | ICD-10-CM | POA: Diagnosis not present

## 2017-09-20 DIAGNOSIS — S2220XA Unspecified fracture of sternum, initial encounter for closed fracture: Secondary | ICD-10-CM | POA: Diagnosis not present

## 2017-09-20 DIAGNOSIS — R3129 Other microscopic hematuria: Secondary | ICD-10-CM | POA: Diagnosis not present

## 2017-09-20 DIAGNOSIS — T17890A Other foreign object in other parts of respiratory tract causing asphyxiation, initial encounter: Secondary | ICD-10-CM | POA: Diagnosis not present

## 2017-09-20 DIAGNOSIS — S27321A Contusion of lung, unilateral, initial encounter: Secondary | ICD-10-CM | POA: Diagnosis not present

## 2017-09-20 DIAGNOSIS — S20211A Contusion of right front wall of thorax, initial encounter: Secondary | ICD-10-CM | POA: Diagnosis not present

## 2017-10-03 DIAGNOSIS — S2231XD Fracture of one rib, right side, subsequent encounter for fracture with routine healing: Secondary | ICD-10-CM | POA: Diagnosis not present

## 2017-10-03 DIAGNOSIS — D649 Anemia, unspecified: Secondary | ICD-10-CM | POA: Diagnosis not present

## 2017-10-03 DIAGNOSIS — S272XXD Traumatic hemopneumothorax, subsequent encounter: Secondary | ICD-10-CM | POA: Diagnosis not present

## 2017-11-14 DIAGNOSIS — S2241XD Multiple fractures of ribs, right side, subsequent encounter for fracture with routine healing: Secondary | ICD-10-CM | POA: Diagnosis not present

## 2017-11-14 DIAGNOSIS — Z87828 Personal history of other (healed) physical injury and trauma: Secondary | ICD-10-CM | POA: Diagnosis not present

## 2017-11-14 DIAGNOSIS — S2222XS Fracture of body of sternum, sequela: Secondary | ICD-10-CM | POA: Diagnosis not present

## 2017-11-14 DIAGNOSIS — S271XXS Traumatic hemothorax, sequela: Secondary | ICD-10-CM | POA: Diagnosis not present

## 2017-12-05 DIAGNOSIS — H353232 Exudative age-related macular degeneration, bilateral, with inactive choroidal neovascularization: Secondary | ICD-10-CM | POA: Diagnosis not present

## 2017-12-05 DIAGNOSIS — H43813 Vitreous degeneration, bilateral: Secondary | ICD-10-CM | POA: Diagnosis not present

## 2017-12-05 DIAGNOSIS — H2513 Age-related nuclear cataract, bilateral: Secondary | ICD-10-CM | POA: Diagnosis not present

## 2017-12-13 DIAGNOSIS — H903 Sensorineural hearing loss, bilateral: Secondary | ICD-10-CM | POA: Diagnosis not present

## 2017-12-25 ENCOUNTER — Ambulatory Visit (INDEPENDENT_AMBULATORY_CARE_PROVIDER_SITE_OTHER): Payer: Medicare Other | Admitting: Cardiovascular Disease

## 2017-12-25 ENCOUNTER — Encounter: Payer: Self-pay | Admitting: Cardiovascular Disease

## 2017-12-25 VITALS — BP 150/56 | HR 52 | Ht 60.0 in | Wt 147.8 lb

## 2017-12-25 DIAGNOSIS — I480 Paroxysmal atrial fibrillation: Secondary | ICD-10-CM | POA: Diagnosis not present

## 2017-12-25 MED ORDER — AMIODARONE HCL 200 MG PO TABS: 200.0000 mg | ORAL_TABLET | Freq: Every day | ORAL | 3 refills | Status: DC

## 2017-12-25 MED ORDER — METOPROLOL TARTRATE 50 MG PO TABS: 25.0000 mg | ORAL_TABLET | Freq: Two times a day (BID) | ORAL | 3 refills | Status: DC

## 2017-12-25 MED ORDER — KLOR-CON 10 10 MEQ PO TBCR: 10.0000 meq | EXTENDED_RELEASE_TABLET | ORAL | 3 refills | Status: DC

## 2017-12-25 MED ORDER — APIXABAN 5 MG PO TABS: 5.0000 mg | ORAL_TABLET | Freq: Every day | ORAL | 3 refills | Status: DC

## 2017-12-25 MED ORDER — FUROSEMIDE 20 MG PO TABS: 20.0000 mg | ORAL_TABLET | ORAL | 3 refills | Status: DC

## 2017-12-25 MED ORDER — CLONIDINE HCL 0.1 MG PO TABS: 0.1000 mg | ORAL_TABLET | Freq: Every day | ORAL | 3 refills | Status: DC | PRN

## 2017-12-25 MED ORDER — IRBESARTAN 150 MG PO TABS: 75.0000 mg | ORAL_TABLET | Freq: Every day | ORAL | 3 refills | Status: DC

## 2017-12-25 NOTE — Patient Instructions (Signed)
Medication Instructions:  Your provider recommends that you continue on your current medications as directed. Please refer to the Current Medication list given to you today.    Labwork: TODAY: CBC, CMET, TSH  Testing/Procedures: None  Follow-Up: Your provider wants you to follow-up in: 6 months with Dr. Burt Knack. You will receive a reminder letter in the mail two months in advance. If you don't receive a letter, please call our office to schedule the follow-up appointment.    Any Other Special Instructions Will Be Listed Below (If Applicable).     If you need a refill on your cardiac medications before your next appointment, please call your pharmacy.

## 2017-12-25 NOTE — Progress Notes (Signed)
Cardiology Office Note Date:  12/27/2017   ID:  [place], Giammona 04/07/1932, MRN [id number]  PCP:  Default, Provider, MD  Cardiologist:  Sherren Mocha, MD    Chief Complaint  Patient presents with  . Atrial Fibrillation     History of Present Illness: Elizabeth Everett is a 84 y.o. female who presents for initial evaluation of paroxysmal atrial fibrillation.  The patient has been followed closely by Dr. Ola Spurr for several years.  I take care of her husband and she has decided to transition her care into our health system.  She has had problems over several years now with symptomatic paroxysmal atrial fibrillation.  She has been managed for approximately 2 years on a combination of amiodarone for rhythm control and apixaban for anticoagulation.  The patient is here with her husband today.  She reports that she is doing fairly well.  She denies any recent heart palpitations, lightheadedness, or syncope.  She has previously had a lot of trouble with labile blood pressures and she was taking her blood pressure very frequently.  This is all leveled out and she has quit taking her blood pressure readings so often.  She uses clonidine for severely elevated blood pressure on an as-needed basis.  The patient denies any chest pain, chest pressure, or shortness of breath.  She denies leg swelling, orthopnea, or PND.  Past Medical History:  Diagnosis Date  . Acute respiratory failure with hypoxia and hypercapnia (HCC)   . Arthritis   . Atrial fibrillation ([place])   . Baker's cyst of knee    right   . Bradycardia   . Cancer ([place])    skin cancer; endometrial cancer   . Colonic pseudoobstruction   . Dizziness   . Dysrhythmia   . Edema of lower extremity    bilat   . GERD (gastroesophageal reflux disease)   . Hard of hearing   . Heart murmur   . History of atrial flutter   . History of bronchitis   . History of kidney stones   . Hypercholesteremia   . Hypertension   . Macular  degeneration   . Multiple falls   . Pancreatitis   . Scoliosis   . Shortness of breath dyspnea    can not climb full set of steps without stopping   . Toxic metabolic encephalopathy     Past Surgical History:  Procedure Laterality Date  . CARDIOVERSION    . DILATION AND CURETTAGE OF UTERUS    . FOOT SURGERY     bone surgery  . KNEE ARTHROSCOPY Right   . TONSILLECTOMY    . TOTAL KNEE ARTHROPLASTY Right 12/01/2015   Procedure: RIGHT TOTAL KNEE ARTHROPLASTY;  Surgeon: Paralee Cancel, MD;  Location: [facility];  Service: Orthopedics;  Laterality: Right;  Marland Kitchen VAGINAL HYSTERECTOMY      Current Outpatient Medications  Medication Sig Dispense Refill  . amiodarone (PACERONE) 200 MG tablet Take 1 tablet (200 mg total) by mouth daily. 90 tablet 3  . apixaban (ELIQUIS) 5 MG TABS tablet Take 1 tablet (5 mg total) by mouth daily. 90 tablet 3  . cloNIDine (CATAPRES) 0.1 MG tablet Take 1 tablet (0.1 mg total) by mouth daily as needed. For BP > 170/90 60 tablet 3  . ferrous sulfate 325 (65 FE) MG tablet Take 1 tablet (325 mg total) by mouth 3 (three) times daily after meals.  3  . FOLIC ACID PO Take 1 tablet by mouth daily.    Marland Kitchen  furosemide (LASIX) 20 MG tablet Take 1 tablet (20 mg total) by mouth every other day. 45 tablet 3  . irbesartan (AVAPRO) 150 MG tablet Take 0.5 tablets (75 mg total) by mouth daily. 45 tablet 3  . KLOR-CON 10 10 MEQ tablet Take 1 tablet (10 mEq total) by mouth every other day. 45 tablet 3  . metoprolol tartrate (LOPRESSOR) 50 MG tablet Take 0.5 tablets (25 mg total) by mouth 2 (two) times daily. 90 tablet 3  . Multiple Vitamins-Minerals (EYE VITAMINS PO) Take 2 tablets by mouth daily.     . pantoprazole (PROTONIX) 40 MG tablet Take 40 mg by mouth daily.    . simvastatin (ZOCOR) 20 MG tablet Take 20 mg by mouth daily.    Marland Kitchen VITAMIN E PO Take 1 capsule by mouth every other day.     No current facility-administered medications for this visit.     Allergies:   Evista [raloxifene];  Morphine; Statins; Erythromycin; and Gentamicin   Social History:  The patient  reports that  has never smoked. she has never used smokeless tobacco. She reports that she drinks alcohol. She reports that she does not use drugs.   Family History:  [name]  family history includes Lymphoma in her father; Macular degeneration in her father and mother.    ROS:  Please see the history of present illness.  Otherwise, review of systems is positive for hearing loss, easy bruising.  All other systems are reviewed and negative.    PHYSICAL EXAM: VS:  BP (!) 150/56   Pulse (!) 52   Ht 5' (1.524 m)   Wt 147 lb 12.8 oz (67 kg)   BMI 28.87 kg/m  , BMI Body mass index is 28.87 kg/m. GEN: Well nourished, well developed, pleasant elderly woman in no acute distress  HEENT: normal  Neck: no JVD, no masses. No carotid bruits Cardiac: RRR without murmur or gallop                Respiratory:  clear to auscultation bilaterally, normal work of breathing GI: soft, nontender, nondistended, + BS MS: no deformity or atrophy  Ext: no pretibial edema, pedal pulses 2+= bilaterally Skin: warm and dry, no rash Neuro:  Strength and sensation are intact Psych: euthymic mood, full affect  EKG:  EKG is not ordered today.  Recent Labs: 12/25/2017: ALT 31; BUN 14; Creatinine, Ser 1.12; Hemoglobin 12.1; Platelets 247; Potassium 5.0; Sodium 144; TSH 3.010   Lipid Panel     Component Value Date/Time   TRIG 126.0 08/18/2015 1055      Wt Readings from Last 3 Encounters:  12/25/17 147 lb 12.8 oz (67 kg)  12/14/16 152 lb (68.9 kg)  12/01/15 151 lb (68.5 kg)     ASSESSMENT AND PLAN: 1.  Paroxysmal atrial fibrillation: CHADS-Vasc = 5.  Patient will continue on amiodarone.  She appears to be tolerating apixaban without bleeding complications.  Will update lab work for amiodarone monitoring to include thyroid studies and liver function test.  She has regular eye exams.  We discussed her treatment plan at length  and I will see her back in 6 months.  2.  Hypertension: Blood pressure managed with metoprolol, irbesartan.  3.  Hyperlipidemia: Treated with simvastatin.  Followed by her primary physician.  4.  Orthostatic hypotension: Outpatient records are reviewed.  Does not seem to be an issue at present and the patient seems to be tolerating her antihypertensive medical program.  Current medicines are reviewed with the  patient today.  The patient does not have concerns regarding medicines.  Labs/ tests ordered today include:   Orders Placed This Encounter  Procedures  . CBC with Differential/Platelet  . Comprehensive metabolic panel  . TSH    Disposition:   FU 6 months   Signed, Sherren Mocha, MD  12/27/2017 12:14 PM    [facility] [address], [place], [place]  [address] Phone: [phone]; Fax: [phone]

## 2017-12-26 LAB — COMPREHENSIVE METABOLIC PANEL
ALBUMIN: 4.1 g/dL (ref 3.5–4.7)
ALK PHOS: 135 IU/L — AB (ref 39–117)
ALT: 31 IU/L (ref 0–32)
AST: 34 IU/L (ref 0–40)
Albumin/Globulin Ratio: 1.5 (ref 1.2–2.2)
BUN/Creatinine Ratio: 13 (ref 12–28)
BUN: 14 mg/dL (ref 8–27)
Bilirubin Total: 0.4 mg/dL (ref 0.0–1.2)
CO2: 26 mmol/L (ref 20–29)
CREATININE: 1.12 mg/dL — AB (ref 0.57–1.00)
Calcium: 9.9 mg/dL (ref 8.7–10.3)
Chloride: 105 mmol/L (ref 96–106)
GFR, EST AFRICAN AMERICAN: 52 mL/min/{1.73_m2} — AB (ref >59)
GFR, EST NON AFRICAN AMERICAN: 46 mL/min/{1.73_m2} — AB (ref >59)
GLOBULIN, TOTAL: 2.8 g/dL (ref 1.5–4.5)
GLUCOSE: 85 mg/dL (ref 65–99)
Potassium: 5 mmol/L (ref 3.5–5.2)
SODIUM: 144 mmol/L (ref 134–144)
TOTAL PROTEIN: 6.9 g/dL (ref 6.0–8.5)

## 2017-12-26 LAB — CBC WITH DIFFERENTIAL/PLATELET
Basophils Absolute: 0.1 10*3/uL (ref 0.0–0.2)
Basos: 1 %
EOS (ABSOLUTE): 0.2 10*3/uL (ref 0.0–0.4)
EOS: 2 %
HEMATOCRIT: 38 % (ref 34.0–46.6)
HEMOGLOBIN: 12.1 g/dL (ref 11.1–15.9)
IMMATURE GRANS (ABS): 0 10*3/uL (ref 0.0–0.1)
Immature Granulocytes: 0 %
LYMPHS: 10 %
Lymphocytes Absolute: 1 10*3/uL (ref 0.7–3.1)
MCH: 27.6 pg (ref 26.6–33.0)
MCHC: 31.8 g/dL (ref 31.5–35.7)
MCV: 87 fL (ref 79–97)
Monocytes Absolute: 1.3 10*3/uL — ABNORMAL HIGH (ref 0.1–0.9)
Monocytes: 13 %
NEUTROS ABS: 7.2 10*3/uL — AB (ref 1.4–7.0)
Neutrophils: 74 %
Platelets: 247 10*3/uL (ref 150–379)
RBC: 4.39 x10E6/uL (ref 3.77–5.28)
RDW: 14.7 % (ref 12.3–15.4)
WBC: 9.7 10*3/uL (ref 3.4–10.8)

## 2017-12-26 LAB — TSH: TSH: 3.01 u[IU]/mL (ref 0.450–4.500)

## 2017-12-27 ENCOUNTER — Encounter: Payer: Self-pay | Admitting: Cardiovascular Disease

## 2018-02-09 ENCOUNTER — Other Ambulatory Visit: Payer: Self-pay | Admitting: Family Medicine

## 2018-02-09 DIAGNOSIS — Z1231 Encounter for screening mammogram for malignant neoplasm of breast: Secondary | ICD-10-CM

## 2018-02-20 DIAGNOSIS — R739 Hyperglycemia, unspecified: Secondary | ICD-10-CM | POA: Diagnosis not present

## 2018-02-20 DIAGNOSIS — I10 Essential (primary) hypertension: Secondary | ICD-10-CM | POA: Diagnosis not present

## 2018-02-20 DIAGNOSIS — E785 Hyperlipidemia, unspecified: Secondary | ICD-10-CM | POA: Diagnosis not present

## 2018-02-20 DIAGNOSIS — Z79899 Other long term (current) drug therapy: Secondary | ICD-10-CM | POA: Diagnosis not present

## 2018-02-20 DIAGNOSIS — I4892 Unspecified atrial flutter: Secondary | ICD-10-CM | POA: Diagnosis not present

## 2018-02-24 DIAGNOSIS — R1901 Right upper quadrant abdominal swelling, mass and lump: Secondary | ICD-10-CM | POA: Diagnosis not present

## 2018-02-24 DIAGNOSIS — R1907 Generalized intra-abdominal and pelvic swelling, mass and lump: Secondary | ICD-10-CM | POA: Diagnosis not present

## 2018-03-12 ENCOUNTER — Ambulatory Visit
Admission: RE | Admit: 2018-03-12 | Discharge: 2018-03-12 | Disposition: A | Discharge status: Home / Self Care | Payer: Medicare Other | Source: Ambulatory Visit | Attending: Family Medicine | Admitting: Family Medicine

## 2018-03-12 DIAGNOSIS — Z1231 Encounter for screening mammogram for malignant neoplasm of breast: Secondary | ICD-10-CM | POA: Diagnosis not present

## 2018-05-09 DIAGNOSIS — H43813 Vitreous degeneration, bilateral: Secondary | ICD-10-CM | POA: Diagnosis not present

## 2018-05-09 DIAGNOSIS — H353232 Exudative age-related macular degeneration, bilateral, with inactive choroidal neovascularization: Secondary | ICD-10-CM | POA: Diagnosis not present

## 2018-05-09 DIAGNOSIS — H2513 Age-related nuclear cataract, bilateral: Secondary | ICD-10-CM | POA: Diagnosis not present

## 2018-07-04 ENCOUNTER — Ambulatory Visit
Admission: RE | Admit: 2018-07-04 | Discharge: 2018-07-04 | Disposition: A | Discharge status: Home / Self Care | Payer: Medicare Other | Source: Ambulatory Visit | Attending: Cardiovascular Disease | Admitting: Cardiovascular Disease

## 2018-07-04 ENCOUNTER — Ambulatory Visit (INDEPENDENT_AMBULATORY_CARE_PROVIDER_SITE_OTHER): Payer: Medicare Other | Admitting: Cardiovascular Disease

## 2018-07-04 ENCOUNTER — Encounter: Payer: Self-pay | Admitting: Cardiovascular Disease

## 2018-07-04 VITALS — BP 160/78 | HR 56 | Ht 60.0 in | Wt 152.0 lb

## 2018-07-04 DIAGNOSIS — I480 Paroxysmal atrial fibrillation: Secondary | ICD-10-CM

## 2018-07-04 DIAGNOSIS — E7800 Pure hypercholesterolemia, unspecified: Secondary | ICD-10-CM | POA: Diagnosis not present

## 2018-07-04 MED ORDER — METOPROLOL TARTRATE 25 MG PO TABS: 25.0000 mg | ORAL_TABLET | Freq: Two times a day (BID) | ORAL | 3 refills | Status: DC

## 2018-07-04 NOTE — Patient Instructions (Addendum)
Medication Instructions:  Your provider recommends that you continue on your current medications as directed. Please refer to the Current Medication list given to you today.    Labwork: Your provider recommends that you return for fasting labs in 6 months.  Testing/Procedures: Dr. Burt Knack recommends you have a chest xray. Please proceed to [place] at [facility]. You do not need an appointment.  [facility] provider has requested that you have an echocardiogram in 6 months, prior to your visit with Dr. Burt Knack. Echocardiography is a painless test that uses sound waves to create images of your heart. It provides your doctor with information about the size and shape of your heart and how well your heart's chambers and valves are working. This procedure takes approximately one hour. There are no restrictions for this procedure.  Follow-Up: You will be called to arrange your 6 month appointment with Dr. Burt Knack, echo, and lab work.  Any Other Special Instructions Will Be Listed Below (If Applicable).     If you need a refill on your cardiac medications before your next appointment, please call your pharmacy.

## 2018-07-04 NOTE — Progress Notes (Signed)
Cardiology Office Note Date:  07/10/2018   ID:  Elizabeth Everett, DOB 1932-02-22, MRN [id number]  PCP:  Street, Sharon Mt, MD  Cardiologist:  Sherren Mocha, MD    Chief Complaint  Patient presents with  . Shortness of Breath   History of Present Illness: Elizabeth DROTAR is a 84 y.o. female who presents for follow-up of paroxysmal atrial fibrillation.  The patient has been treated with a rhythm control strategy using amiodarone and anticoagulation with apixaban.  The patient is here with her husband today.  She is doing quite well from a symptomatic perspective.  She denies chest pain, chest pressure, or heart palpitations.  She denies leg swelling, orthopnea, or PND.  She does admit to mild exertional dyspnea which is long-standing and unchanged over time.  She is tolerating anticoagulation and denies bleeding problems.    Past Medical History:  Diagnosis Date  . Acute respiratory failure with hypoxia and hypercapnia (HCC)   . Arthritis   . Atrial fibrillation ([place])   . Baker's cyst of knee    right   . Bradycardia   . Cancer ([place])    skin cancer; endometrial cancer   . Colonic pseudoobstruction   . Dizziness   . Dysrhythmia   . Edema of lower extremity    bilat   . GERD (gastroesophageal reflux disease)   . Hard of hearing   . Heart murmur   . History of atrial flutter   . History of bronchitis   . History of kidney stones   . Hypercholesteremia   . Hypertension   . Macular degeneration   . Multiple falls   . Pancreatitis   . Scoliosis   . Shortness of breath dyspnea    can not climb full set of steps without stopping   . Toxic metabolic encephalopathy     Past Surgical History:  Procedure Laterality Date  . CARDIOVERSION    . DILATION AND CURETTAGE OF UTERUS    . FOOT SURGERY     bone surgery  . KNEE ARTHROSCOPY Right   . TONSILLECTOMY    . TOTAL KNEE ARTHROPLASTY Right 12/01/2015   Procedure: RIGHT TOTAL KNEE ARTHROPLASTY;  Surgeon: Paralee Cancel, MD;   Location: [facility];  Service: Orthopedics;  Laterality: Right;  Marland Kitchen VAGINAL HYSTERECTOMY      Current Outpatient Medications  Medication Sig Dispense Refill  . amiodarone (PACERONE) 200 MG tablet Take 1 tablet (200 mg total) by mouth daily. 90 tablet 3  . apixaban (ELIQUIS) 5 MG TABS tablet Take 1 tablet (5 mg total) by mouth daily. 90 tablet 3  . cloNIDine (CATAPRES) 0.1 MG tablet Take 0.1 mg by mouth as needed.    Marland Kitchen FOLIC ACID PO Take 1 tablet by mouth daily.    . furosemide (LASIX) 20 MG tablet Take 1 tablet (20 mg total) by mouth every other day. 45 tablet 3  . irbesartan (AVAPRO) 150 MG tablet Take 0.5 tablets (75 mg total) by mouth daily. 45 tablet 3  . KLOR-CON 10 10 MEQ tablet Take 1 tablet (10 mEq total) by mouth every other day. 45 tablet 3  . metoprolol tartrate (LOPRESSOR) 25 MG tablet Take 1 tablet (25 mg total) by mouth 2 (two) times daily. 180 tablet 3  . Multiple Vitamins-Minerals (EYE VITAMINS PO) Take 2 tablets by mouth daily.     . pantoprazole (PROTONIX) 40 MG tablet Take 40 mg by mouth daily.    . simvastatin (ZOCOR) 20 MG tablet Take 20 mg  by mouth daily.    Marland Kitchen VITAMIN E PO Take 1 capsule by mouth every other day.    . cloNIDine (CATAPRES) 0.1 MG tablet Take 1 tablet (0.1 mg total) by mouth daily as needed. For BP > 170/90 60 tablet 3   No current facility-administered medications for this visit.     Allergies:   Evista [raloxifene]; Morphine; Statins; Erythromycin; and Gentamicin   Social History:  The patient  reports that she has never smoked. She has never used smokeless tobacco. She reports that she drinks alcohol. She reports that she does not use drugs.   Family History:  [name]  family history includes Lymphoma in her father; Macular degeneration in her father and mother.    ROS:  Please see the history of present illness.  Otherwise, review of systems is positive for hearing loss, dizziness, easy bruising.  All other systems are reviewed and negative.     PHYSICAL EXAM: VS:  BP (!) 160/78   Pulse (!) 56   Ht 5' (1.524 m)   Wt 152 lb (68.9 kg)   BMI 29.69 kg/m  , BMI Body mass index is 29.69 kg/m. GEN: Well nourished, well developed, pleasant elderly woman in no acute distress  HEENT: normal  Neck: no JVD, no masses. No carotid bruits Cardiac: RRR without murmur or gallop                Respiratory:  clear to auscultation bilaterally, normal work of breathing GI: soft, nontender, nondistended, + BS MS: no deformity or atrophy  Ext: no pretibial edema, pedal pulses 2+= bilaterally Skin: warm and dry, no rash Neuro:  Strength and sensation are intact Psych: euthymic mood, full affect  EKG:  EKG is ordered today. The ekg ordered today shows sinus bradycardia 56 bpm, nonspecific T wave abnormality.  Recent Labs: 12/25/2017: ALT 31; BUN 14; Creatinine, Ser 1.12; Hemoglobin 12.1; Platelets 247; Potassium 5.0; Sodium 144; TSH 3.010   Lipid Panel     Component Value Date/Time   TRIG 126.0 08/18/2015 1055      Wt Readings from Last 3 Encounters:  07/04/18 152 lb (68.9 kg)  12/25/17 147 lb 12.8 oz (67 kg)  12/14/16 152 lb (68.9 kg)     ASSESSMENT AND PLAN: 1.  Paroxysmal atrial fibrillation: Chads-2 vascular score equals 5.  She continues on amiodarone for rhythm control.  She appears to be tolerating apixaban for anticoagulation.  No bleeding problems reported.  We will update her echo in approximately 6 months when she returns for clinical follow-up.  2.  Hypertension: Treated with metoprolol and irbesartan.  Blood pressure has been labile over time.  She feels this is improved and she is monitoring her blood pressure less frequently.  3.  Mixed hyperlipidemia: Patient is treated with simvastatin.  Lipids have been followed by her primary care doctor.  Current medicines are reviewed with the patient today.  The patient does not have concerns regarding medicines.  Labs/ tests ordered today include:   Orders Placed This  Encounter  Procedures  . DG Chest 2 View  . Comprehensive metabolic panel  . CBC with Differential/Platelet  . Lipid panel  . TSH  . EKG 12-Lead  . ECHOCARDIOGRAM COMPLETE    Disposition:   FU 6 months with an echocardiogram and office visit.  Signed, Sherren Mocha, MD  07/10/2018 4:59 PM    [facility] [place], [place], [place]  [address] Phone: [phone]; Fax: [phone]

## 2018-07-05 DIAGNOSIS — L821 Other seborrheic keratosis: Secondary | ICD-10-CM | POA: Diagnosis not present

## 2018-07-05 DIAGNOSIS — L570 Actinic keratosis: Secondary | ICD-10-CM | POA: Diagnosis not present

## 2018-07-05 DIAGNOSIS — C4359 Malignant melanoma of other part of trunk: Secondary | ICD-10-CM | POA: Diagnosis not present

## 2018-07-05 DIAGNOSIS — L578 Other skin changes due to chronic exposure to nonionizing radiation: Secondary | ICD-10-CM | POA: Diagnosis not present

## 2018-07-05 DIAGNOSIS — Z85828 Personal history of other malignant neoplasm of skin: Secondary | ICD-10-CM | POA: Diagnosis not present

## 2018-07-05 DIAGNOSIS — Z08 Encounter for follow-up examination after completed treatment for malignant neoplasm: Secondary | ICD-10-CM | POA: Diagnosis not present

## 2018-07-05 DIAGNOSIS — D485 Neoplasm of uncertain behavior of skin: Secondary | ICD-10-CM | POA: Diagnosis not present

## 2018-07-05 DIAGNOSIS — L2089 Other atopic dermatitis: Secondary | ICD-10-CM | POA: Diagnosis not present

## 2018-07-05 DIAGNOSIS — K130 Diseases of lips: Secondary | ICD-10-CM | POA: Diagnosis not present

## 2018-07-10 ENCOUNTER — Encounter: Payer: Self-pay | Admitting: Cardiovascular Disease

## 2018-08-02 DIAGNOSIS — C4359 Malignant melanoma of other part of trunk: Secondary | ICD-10-CM | POA: Diagnosis not present

## 2018-08-14 ENCOUNTER — Telehealth: Payer: Self-pay

## 2018-08-14 DIAGNOSIS — Z23 Encounter for immunization: Secondary | ICD-10-CM | POA: Diagnosis not present

## 2018-08-14 NOTE — Telephone Encounter (Signed)
Scheduled echo and lab work 01/04/2019. Scheduled OV with Dr. Burt Knack 01/07/2019. Patient was grateful for call and agrees with treatment plan.

## 2018-08-14 NOTE — Telephone Encounter (Signed)
-----   Message from Theodoro Parma, RN sent at 07/04/2018  9:54 AM EDT ----- Regarding: FW: 6 mo MC, ecgo and labs prior due 01/02/2019 Cbc, cmet,lipids, tsh ----- Message ----- From: Theodoro Parma, RN Sent: 07/04/2018   9:48 AM EDT To: Theodoro Parma, RN Subject: FW: 6 mo MC, ecgo and labs prior due 01/02/2019    ----- Message ----- From: Theodoro Parma, RN Sent: 07/04/2018   9:45 AM EDT To: Theodoro Parma, RN Subject: 6 mo MC, ecgo and labs prior due 01/02/2019      [place] OV due 01/02/19 with echo and fasting labs NOT same day prior to visit

## 2018-08-17 DIAGNOSIS — Z23 Encounter for immunization: Secondary | ICD-10-CM | POA: Diagnosis not present

## 2018-08-17 DIAGNOSIS — I10 Essential (primary) hypertension: Secondary | ICD-10-CM | POA: Diagnosis not present

## 2018-08-17 DIAGNOSIS — Z7901 Long term (current) use of anticoagulants: Secondary | ICD-10-CM | POA: Diagnosis not present

## 2018-08-17 DIAGNOSIS — Z79899 Other long term (current) drug therapy: Secondary | ICD-10-CM | POA: Diagnosis not present

## 2018-08-17 DIAGNOSIS — R739 Hyperglycemia, unspecified: Secondary | ICD-10-CM | POA: Diagnosis not present

## 2018-08-17 DIAGNOSIS — Z0000 Encounter for general adult medical examination without abnormal findings: Secondary | ICD-10-CM | POA: Diagnosis not present

## 2018-08-17 DIAGNOSIS — E785 Hyperlipidemia, unspecified: Secondary | ICD-10-CM | POA: Diagnosis not present

## 2018-08-17 DIAGNOSIS — I4892 Unspecified atrial flutter: Secondary | ICD-10-CM | POA: Diagnosis not present

## 2018-09-17 ENCOUNTER — Other Ambulatory Visit: Payer: Self-pay

## 2018-10-09 IMAGING — CT CT CERVICAL SPINE W/O CM
2 of 11 series · 5 of 34 positions shown, 6 images · non-contrast
Comparison: 11/10/2016 CT

CLINICAL DATA: Pain after fall

EXAM:
CT HEAD WITHOUT CONTRAST
CT MAXILLOFACIAL WITHOUT CONTRAST
CT CERVICAL SPINE WITHOUT CONTRAST
TECHNIQUE: Multidetector CT imaging of the head, cervical spine, and
maxillofacial structures were performed using the standard protocol
without intravenous contrast. Multiplanar CT image reconstructions
of the cervical spine and maxillofacial structures were also
generated.

[Series 16: c_spine 2.0 3 st · axial · 0.35mm/px · z∈[-306,-118]mm · 3 of 95 slices shown, 4 images]
[im 1/95  soft-tissue]
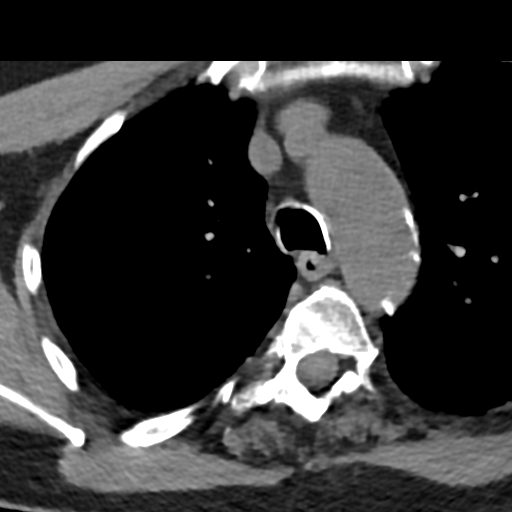
[im 1/95  bone]
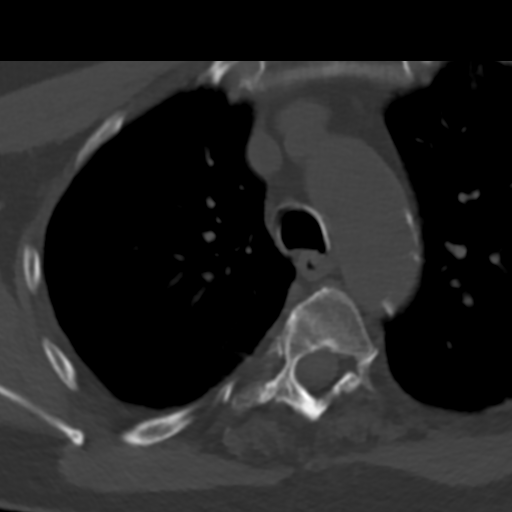
[im 48/95  bone]
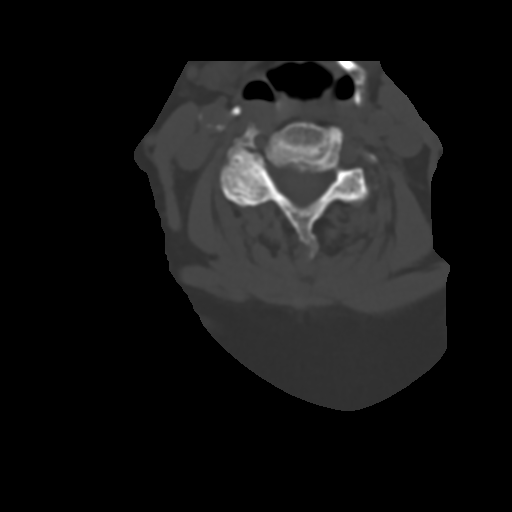
[im 95/95  bone]
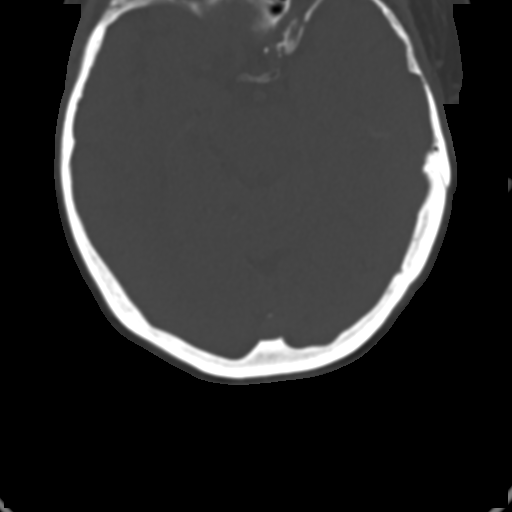

[Series 20: sagittal bone · sagittal · 0.31mm/px · 2 of 61 slices shown]
[im 21/61  bone]
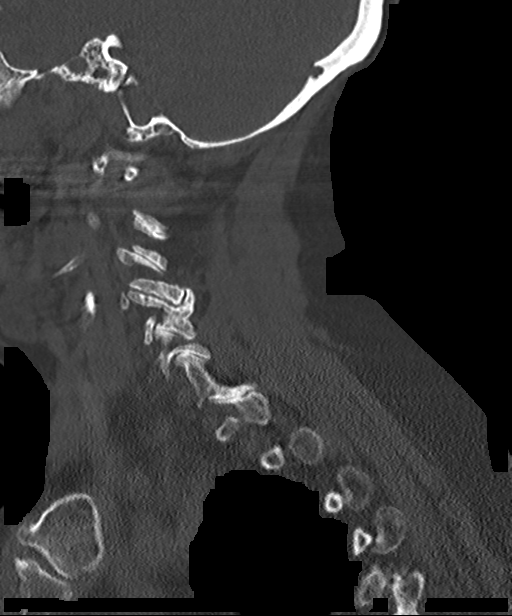
[im 41/61  bone]
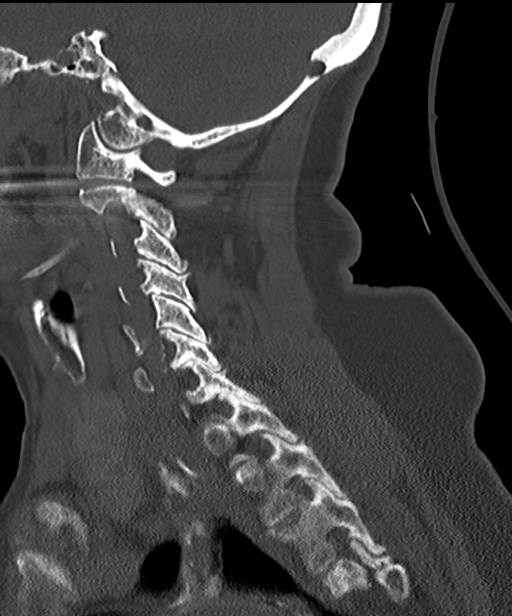

[5 of 34 positions shown; findings below may reference images not displayed]

FINDINGS: CT HEAD FINDINGS

BRAIN: Mild stable degree of periventricular and subcortical white
matter hypodensities consistent with chronic small vessel ischemic
disease. No acute large vascular territory infarcts. No abnormal
extra-axial fluid collections. Basal cisterns are not effaced and
midline.

VASCULAR: Moderate calcific atherosclerosis of the carotid siphons.
Ectatic left vertebral artery with calcification.

SKULL: No skull fracture.  Left mild forehead soft tissue swelling.

SINUSES/ORBITS: The mastoid air-cells are clear. The included
paranasal sinuses are well-aerated.The included ocular globes and
orbital contents are non-suspicious.

OTHER: None.

CT MAXILLOFACIAL FINDINGS

Osseous: No fracture or mandibular dislocation. No destructive
process. Chronic leftward deviation of the nasal septum with
leftward nasal septal spur.

Orbits: Negative.  No posttraumatic or inflammatory findings.

Sinuses: Clear

Soft tissues: Left premalar, periorbital and left forehead soft
tissue swelling.

CT CERVICAL SPINE FINDINGS

Alignment: Normal

Skull base and vertebrae: Arachnoid granulation in the occipital
skull. No cerebellar tonsillar ectopia. No acute cervical spine
fracture.

Soft tissues and spinal canal: No prevertebral fluid or swelling. No
visible canal hematoma.

Disc levels: Cervical spondylosis with disc space narrowing most
prominent from C3 through C7 and at T2-3 with bilateral facet
arthropathy characterized by joint space narrowing, sclerosis and
mild hypertrophy. Slight retrolisthesis of C3 on C4 by 3 mm, likely
degenerative. Mild right C3-4 and C4-5 neural foraminal encroachment
from osteophytes and mild left C5-6 neural foraminal encroachment.
Bilateral uncovertebral joint spurring at C3-4, C4-5, C5-6 and C6-7.

Upper chest: Negative

Other: None
IMPRESSION: 1. Left forehead, periorbital and premalar soft tissue swelling
without underlying fracture.
2. Chronic microangiopathic disease of periventricular and
subcortical white matter. No acute intracranial abnormality.
3. No acute cervical spinal fracture or subluxation. Cervical
spondylosis.

## 2018-11-09 ENCOUNTER — Other Ambulatory Visit: Payer: Self-pay | Admitting: Cardiovascular Disease

## 2018-11-09 MED ORDER — FUROSEMIDE 20 MG PO TABS: 20.0000 mg | ORAL_TABLET | ORAL | 2 refills | Status: DC

## 2018-11-09 NOTE — Telephone Encounter (Signed)
 *  STAT* If patient is at the pharmacy, call can be transferred to refill team.   1. Which medications need to be refilled? (please list name of each medication and dose if known)  furosemide (LASIX) 20 MG tablet pantoprazole (PROTONIX) 40 MG tablet simvastatin (ZOCOR) 20 MG tablet  2. Which pharmacy/location (including street and city if local pharmacy) is medication to be sent to? [facility]  3. Do they need a 30 day or 90 day supply? [place]

## 2018-11-09 NOTE — Telephone Encounter (Signed)
Pt is asking for a refill on these medications. Would you like to refill these medications? Please address. Thank you.

## 2018-11-12 MED ORDER — PANTOPRAZOLE SODIUM 40 MG PO TBEC: 40.0000 mg | DELAYED_RELEASE_TABLET | Freq: Every day | ORAL | 2 refills | Status: DC

## 2018-11-12 MED ORDER — SIMVASTATIN 20 MG PO TABS: 20.0000 mg | ORAL_TABLET | Freq: Every day | ORAL | 2 refills | Status: DC

## 2018-11-13 DIAGNOSIS — H2513 Age-related nuclear cataract, bilateral: Secondary | ICD-10-CM | POA: Diagnosis not present

## 2018-11-13 DIAGNOSIS — H43813 Vitreous degeneration, bilateral: Secondary | ICD-10-CM | POA: Diagnosis not present

## 2018-11-13 DIAGNOSIS — H353232 Exudative age-related macular degeneration, bilateral, with inactive choroidal neovascularization: Secondary | ICD-10-CM | POA: Diagnosis not present

## 2019-01-03 DIAGNOSIS — Z85828 Personal history of other malignant neoplasm of skin: Secondary | ICD-10-CM | POA: Diagnosis not present

## 2019-01-03 DIAGNOSIS — Z08 Encounter for follow-up examination after completed treatment for malignant neoplasm: Secondary | ICD-10-CM | POA: Diagnosis not present

## 2019-01-03 DIAGNOSIS — Z85820 Personal history of malignant melanoma of skin: Secondary | ICD-10-CM | POA: Diagnosis not present

## 2019-01-03 DIAGNOSIS — L820 Inflamed seborrheic keratosis: Secondary | ICD-10-CM | POA: Diagnosis not present

## 2019-01-03 DIAGNOSIS — D1801 Hemangioma of skin and subcutaneous tissue: Secondary | ICD-10-CM | POA: Diagnosis not present

## 2019-01-03 DIAGNOSIS — L570 Actinic keratosis: Secondary | ICD-10-CM | POA: Diagnosis not present

## 2019-01-04 ENCOUNTER — Ambulatory Visit (HOSPITAL_COMMUNITY): Payer: Medicare Other | Attending: Internal Medicine

## 2019-01-04 ENCOUNTER — Other Ambulatory Visit: Payer: Medicare Other | Admitting: *Deleted

## 2019-01-04 DIAGNOSIS — E7800 Pure hypercholesterolemia, unspecified: Secondary | ICD-10-CM | POA: Diagnosis not present

## 2019-01-04 DIAGNOSIS — I480 Paroxysmal atrial fibrillation: Secondary | ICD-10-CM | POA: Insufficient documentation

## 2019-01-05 LAB — COMPREHENSIVE METABOLIC PANEL
A/G RATIO: 1.7 (ref 1.2–2.2)
ALBUMIN: 4 g/dL (ref 3.6–4.6)
ALT: 26 IU/L (ref 0–32)
AST: 30 IU/L (ref 0–40)
Alkaline Phosphatase: 112 IU/L (ref 39–117)
BUN/Creatinine Ratio: 17 (ref 12–28)
BUN: 18 mg/dL (ref 8–27)
Bilirubin Total: 0.3 mg/dL (ref 0.0–1.2)
CALCIUM: 9.5 mg/dL (ref 8.7–10.3)
CO2: 25 mmol/L (ref 20–29)
Chloride: 102 mmol/L (ref 96–106)
Creatinine, Ser: 1.07 mg/dL — ABNORMAL HIGH (ref 0.57–1.00)
GFR, EST AFRICAN AMERICAN: 55 mL/min/{1.73_m2} — AB (ref >59)
GFR, EST NON AFRICAN AMERICAN: 48 mL/min/{1.73_m2} — AB (ref >59)
GLOBULIN, TOTAL: 2.3 g/dL (ref 1.5–4.5)
GLUCOSE: 101 mg/dL — AB (ref 65–99)
POTASSIUM: 4.3 mmol/L (ref 3.5–5.2)
Sodium: 143 mmol/L (ref 134–144)
TOTAL PROTEIN: 6.3 g/dL (ref 6.0–8.5)

## 2019-01-05 LAB — CBC WITH DIFFERENTIAL/PLATELET
BASOS: 1 %
Basophils Absolute: 0 10*3/uL (ref 0.0–0.2)
EOS (ABSOLUTE): 0.2 10*3/uL (ref 0.0–0.4)
Eos: 2 %
HEMATOCRIT: 37.8 % (ref 34.0–46.6)
Hemoglobin: 12.1 g/dL (ref 11.1–15.9)
IMMATURE GRANS (ABS): 0 10*3/uL (ref 0.0–0.1)
Immature Granulocytes: 0 %
LYMPHS: 11 %
Lymphocytes Absolute: 0.9 10*3/uL (ref 0.7–3.1)
MCH: 28.4 pg (ref 26.6–33.0)
MCHC: 32 g/dL (ref 31.5–35.7)
MCV: 89 fL (ref 79–97)
Monocytes Absolute: 0.8 10*3/uL (ref 0.1–0.9)
Monocytes: 10 %
NEUTROS ABS: 6.6 10*3/uL (ref 1.4–7.0)
Neutrophils: 76 %
PLATELETS: 221 10*3/uL (ref 150–450)
RBC: 4.26 x10E6/uL (ref 3.77–5.28)
RDW: 13.4 % (ref 11.7–15.4)
WBC: 8.6 10*3/uL (ref 3.4–10.8)

## 2019-01-05 LAB — LIPID PANEL
CHOL/HDL RATIO: 2.6 ratio (ref 0.0–4.4)
Cholesterol, Total: 164 mg/dL (ref 100–199)
HDL: 62 mg/dL (ref >39)
LDL Calculated: 79 mg/dL (ref 0–99)
Triglycerides: 116 mg/dL (ref 0–149)
VLDL CHOLESTEROL CAL: 23 mg/dL (ref 5–40)

## 2019-01-05 LAB — TSH: TSH: 4.17 u[IU]/mL (ref 0.450–4.500)

## 2019-01-07 ENCOUNTER — Ambulatory Visit: Payer: Medicare Other | Admitting: Cardiovascular Disease

## 2019-01-10 ENCOUNTER — Other Ambulatory Visit: Payer: Self-pay | Admitting: Cardiovascular Disease

## 2019-01-14 ENCOUNTER — Other Ambulatory Visit: Payer: Self-pay | Admitting: Cardiovascular Disease

## 2019-01-21 ENCOUNTER — Telehealth: Payer: Self-pay

## 2019-01-21 MED ORDER — AMIODARONE HCL 200 MG PO TABS: 200.0000 mg | ORAL_TABLET | Freq: Every day | ORAL | 3 refills | Status: DC

## 2019-01-21 MED ORDER — APIXABAN 5 MG PO TABS: 5.0000 mg | ORAL_TABLET | Freq: Two times a day (BID) | ORAL | 3 refills | Status: DC

## 2019-01-21 MED ORDER — IRBESARTAN 150 MG PO TABS: 75.0000 mg | ORAL_TABLET | Freq: Every day | ORAL | 3 refills | Status: DC

## 2019-01-21 NOTE — Telephone Encounter (Signed)
In light of the COVID-19 pandemic, offered to cancel patient's appointment 3/25 and reschedule once precautions are lifted. She understands she will be called to reschedule. She was grateful for call and agrees with treatment plan.  Med refills sent.

## 2019-01-23 ENCOUNTER — Ambulatory Visit: Payer: Medicare Other | Admitting: Cardiovascular Disease

## 2019-02-14 NOTE — Telephone Encounter (Signed)
The patient wishes to have a face to face OV with Dr. Burt Knack  Scheduled her 06/17/19. She understands to call prior to that time if she has questions or concerns.

## 2019-03-28 DIAGNOSIS — N200 Calculus of kidney: Secondary | ICD-10-CM | POA: Diagnosis not present

## 2019-03-28 DIAGNOSIS — N289 Disorder of kidney and ureter, unspecified: Secondary | ICD-10-CM | POA: Diagnosis not present

## 2019-03-28 DIAGNOSIS — I700 Atherosclerosis of aorta: Secondary | ICD-10-CM | POA: Diagnosis not present

## 2019-03-28 DIAGNOSIS — M4185 Other forms of scoliosis, thoracolumbar region: Secondary | ICD-10-CM | POA: Diagnosis not present

## 2019-03-28 DIAGNOSIS — R1901 Right upper quadrant abdominal swelling, mass and lump: Secondary | ICD-10-CM | POA: Diagnosis not present

## 2019-04-04 DIAGNOSIS — N200 Calculus of kidney: Secondary | ICD-10-CM | POA: Diagnosis not present

## 2019-04-04 DIAGNOSIS — N281 Cyst of kidney, acquired: Secondary | ICD-10-CM | POA: Diagnosis not present

## 2019-04-04 DIAGNOSIS — N2889 Other specified disorders of kidney and ureter: Secondary | ICD-10-CM | POA: Diagnosis not present

## 2019-04-16 DIAGNOSIS — D49512 Neoplasm of unspecified behavior of left kidney: Secondary | ICD-10-CM | POA: Diagnosis not present

## 2019-04-22 DIAGNOSIS — I4892 Unspecified atrial flutter: Secondary | ICD-10-CM | POA: Diagnosis not present

## 2019-04-22 DIAGNOSIS — D49512 Neoplasm of unspecified behavior of left kidney: Secondary | ICD-10-CM | POA: Diagnosis not present

## 2019-04-22 DIAGNOSIS — I878 Other specified disorders of veins: Secondary | ICD-10-CM | POA: Diagnosis not present

## 2019-04-22 DIAGNOSIS — R109 Unspecified abdominal pain: Secondary | ICD-10-CM | POA: Diagnosis not present

## 2019-05-10 DIAGNOSIS — H2513 Age-related nuclear cataract, bilateral: Secondary | ICD-10-CM | POA: Diagnosis not present

## 2019-05-10 DIAGNOSIS — H353232 Exudative age-related macular degeneration, bilateral, with inactive choroidal neovascularization: Secondary | ICD-10-CM | POA: Diagnosis not present

## 2019-05-10 DIAGNOSIS — H43813 Vitreous degeneration, bilateral: Secondary | ICD-10-CM | POA: Diagnosis not present

## 2019-06-17 ENCOUNTER — Ambulatory Visit (INDEPENDENT_AMBULATORY_CARE_PROVIDER_SITE_OTHER): Payer: Medicare Other | Admitting: Cardiovascular Disease

## 2019-06-17 ENCOUNTER — Other Ambulatory Visit: Payer: Self-pay

## 2019-06-17 ENCOUNTER — Telehealth: Payer: Self-pay | Admitting: *Deleted

## 2019-06-17 ENCOUNTER — Encounter: Payer: Self-pay | Admitting: Cardiovascular Disease

## 2019-06-17 VITALS — BP 180/80 | HR 63 | Ht 60.0 in | Wt 152.2 lb

## 2019-06-17 DIAGNOSIS — I480 Paroxysmal atrial fibrillation: Secondary | ICD-10-CM | POA: Diagnosis not present

## 2019-06-17 DIAGNOSIS — E7800 Pure hypercholesterolemia, unspecified: Secondary | ICD-10-CM | POA: Diagnosis not present

## 2019-06-17 DIAGNOSIS — R002 Palpitations: Secondary | ICD-10-CM | POA: Diagnosis not present

## 2019-06-17 LAB — CBC WITH DIFFERENTIAL/PLATELET
Basophils Absolute: 0.1 10*3/uL (ref 0.0–0.2)
Basos: 1 %
EOS (ABSOLUTE): 0.2 10*3/uL (ref 0.0–0.4)
Eos: 3 %
Hematocrit: 38.8 % (ref 34.0–46.6)
Hemoglobin: 12.5 g/dL (ref 11.1–15.9)
Immature Grans (Abs): 0 10*3/uL (ref 0.0–0.1)
Immature Granulocytes: 0 %
Lymphocytes Absolute: 1.2 10*3/uL (ref 0.7–3.1)
Lymphs: 15 %
MCH: 29.1 pg (ref 26.6–33.0)
MCHC: 32.2 g/dL (ref 31.5–35.7)
MCV: 90 fL (ref 79–97)
Monocytes Absolute: 0.8 10*3/uL (ref 0.1–0.9)
Monocytes: 10 %
Neutrophils Absolute: 5.8 10*3/uL (ref 1.4–7.0)
Neutrophils: 71 %
Platelets: 215 10*3/uL (ref 150–450)
RBC: 4.29 x10E6/uL (ref 3.77–5.28)
RDW: 13.4 % (ref 11.7–15.4)
WBC: 8 10*3/uL (ref 3.4–10.8)

## 2019-06-17 LAB — LIPID PANEL
Chol/HDL Ratio: 2.7 ratio (ref 0.0–4.4)
Cholesterol, Total: 170 mg/dL (ref 100–199)
HDL: 64 mg/dL (ref >39)
LDL Calculated: 91 mg/dL (ref 0–99)
Triglycerides: 74 mg/dL (ref 0–149)
VLDL Cholesterol Cal: 15 mg/dL (ref 5–40)

## 2019-06-17 LAB — COMPREHENSIVE METABOLIC PANEL
ALT: 30 IU/L (ref 0–32)
AST: 35 IU/L (ref 0–40)
Albumin/Globulin Ratio: 1.9 (ref 1.2–2.2)
Albumin: 4.4 g/dL (ref 3.6–4.6)
Alkaline Phosphatase: 111 IU/L (ref 39–117)
BUN/Creatinine Ratio: 19 (ref 12–28)
BUN: 20 mg/dL (ref 8–27)
Bilirubin Total: 0.4 mg/dL (ref 0.0–1.2)
CO2: 25 mmol/L (ref 20–29)
Calcium: 9.6 mg/dL (ref 8.7–10.3)
Chloride: 103 mmol/L (ref 96–106)
Creatinine, Ser: 1.04 mg/dL — ABNORMAL HIGH (ref 0.57–1.00)
GFR calc Af Amer: 57 mL/min/{1.73_m2} — ABNORMAL LOW (ref >59)
GFR calc non Af Amer: 49 mL/min/{1.73_m2} — ABNORMAL LOW (ref >59)
Globulin, Total: 2.3 g/dL (ref 1.5–4.5)
Glucose: 93 mg/dL (ref 65–99)
Potassium: 4.7 mmol/L (ref 3.5–5.2)
Sodium: 143 mmol/L (ref 134–144)
Total Protein: 6.7 g/dL (ref 6.0–8.5)

## 2019-06-17 LAB — TSH: TSH: 4.46 u[IU]/mL (ref 0.450–4.500)

## 2019-06-17 NOTE — Progress Notes (Signed)
Cardiology Office Note:    Date:  06/22/2019   ID:  Elizabeth Everett, DOB Jul 24, 1932, MRN [id number]  PCP:  Street, Sharon Mt, MD  Cardiologist:  Sherren Mocha, MD  Electrophysiologist:  None   Referring MD: Street, Sharon Mt, *   Chief Complaint  Patient presents with   Hypertension    History of Present Illness:    Elizabeth Everett is a 85 y.o. female with a hx of paroxysmal atrial fibrillation, presenting for follow-up evaluation.  The patient has been treated with a rhythm control strategy using amiodarone and anticoagulation with apixaban. She has longstanding labile hypertension and white coat hypertension. She's here with her husband today.   She complains of more frequent episodes of nocturnal palpitations. She can generally break the palpitations with bearing down. She's had 3 episodes in the past few weeks. They have awakened her at night and she feels her 'heart racing.' no chest pain or pressure. Mild DOE is unchanged. No edema, orthopnea, or PND. Otherwise reports doing ok. She no longer takes her BP on a regular basis because she has been frustrated with the erratic BP readings she's obtained in the past.     Past Medical History:  Diagnosis Date   Acute respiratory failure with hypoxia and hypercapnia (HCC)    Arthritis    Atrial fibrillation ([place])    Baker's cyst of knee    right    Bradycardia    Cancer (HCC)    skin cancer; endometrial cancer    Colonic pseudoobstruction    Dizziness    Dysrhythmia    Edema of lower extremity    bilat    GERD (gastroesophageal reflux disease)    Hard of hearing    Heart murmur    History of atrial flutter    History of bronchitis    History of kidney stones    Hypercholesteremia    Hypertension    Macular degeneration    Multiple falls    Pancreatitis    Scoliosis    Shortness of breath dyspnea    can not climb full set of steps without stopping    Toxic metabolic encephalopathy      Past Surgical History:  Procedure Laterality Date   CARDIOVERSION     DILATION AND CURETTAGE OF UTERUS     FOOT SURGERY     bone surgery   KNEE ARTHROSCOPY Right    TONSILLECTOMY     TOTAL KNEE ARTHROPLASTY Right 12/01/2015   Procedure: RIGHT TOTAL KNEE ARTHROPLASTY;  Surgeon: Paralee Cancel, MD;  Location: [facility];  Service: Orthopedics;  Laterality: Right;   VAGINAL HYSTERECTOMY      Current Medications: Current Meds  Medication Sig   amiodarone (PACERONE) 200 MG tablet Take 1 tablet (200 mg total) by mouth daily.   apixaban (ELIQUIS) 2.5 MG TABS tablet Take 2.5 mg by mouth 2 (two) times daily.   FOLIC ACID PO Take 1 tablet by mouth daily.   furosemide (LASIX) 20 MG tablet Take 1 tablet (20 mg total) by mouth every other day.   irbesartan (AVAPRO) 150 MG tablet Take 0.5 tablets (75 mg total) by mouth daily.   KLOR-CON 10 10 MEQ tablet TAKE 1 TABLET EVERY OTHER DAY   metoprolol tartrate (LOPRESSOR) 25 MG tablet Take 1 tablet (25 mg total) by mouth 2 (two) times daily.   Multiple Vitamins-Minerals (EYE VITAMINS PO) Take 2 tablets by mouth daily.    pantoprazole (PROTONIX) 40 MG tablet Take 1 tablet (40 mg  total) by mouth daily.   simvastatin (ZOCOR) 20 MG tablet Take 1 tablet (20 mg total) by mouth daily.   VITAMIN E PO Take 1 capsule by mouth every other day.     Allergies:   Evista [raloxifene], Morphine, Statins, Erythromycin, and Gentamicin   Social History   Socioeconomic History   Marital status: Married    Spouse name: Not on file   Number of children: Not on file   Years of education: Not on file   Highest education level: Not on file  Occupational History   Occupation: Retired  Scientist, product/process development strain: Not on file   Food insecurity    Worry: Not on file    Inability: Not on Lexicographer needs    Medical: Not on file    Non-medical: Not on file  Tobacco Use   Smoking status: Never Smoker   Smokeless  tobacco: Never Used  Substance and Sexual Activity   Alcohol use: Yes    Alcohol/week: 0.0 standard drinks    Comment: 3 glasses of wine monthly   Drug use: No   Sexual activity: Not on file  Lifestyle   Physical activity    Days per week: Not on file    Minutes per session: Not on file   Stress: Not on file  Relationships   Social connections    Talks on phone: Not on file    Gets together: Not on file    Attends religious service: Not on file    Active member of club or organization: Not on file    Attends meetings of clubs or organizations: Not on file    Relationship status: Not on file  Other Topics Concern   Not on file  Social History Narrative   Not on file     Family History: [name] family history includes Lymphoma in her father; Macular degeneration in her father and mother.  ROS:   Please see the history of present illness.    All other systems reviewed and are negative.  EKGs/Labs/Other Studies Reviewed:    The following studies were reviewed today: Echo 01/04/2019: IMPRESSIONS    1. The left ventricle has normal systolic function with an ejection fraction of 60-65%. The cavity size was normal. There is moderately increased left ventricular wall thickness. Left ventricular diastolic parameters were normal.  2. The right ventricle has normal systolic function. The cavity was normal. There is no increase in right ventricular wall thickness.  3. Left atrial size was severely dilated.  4. The mitral valve is normal in structure. There is mild mitral annular calcification present.  5. The tricuspid valve is normal in structure.  6. The aortic valve is tricuspid Mild thickening of the aortic valve Mild calcification of the aortic valve.  7. The pulmonic valve was grossly normal. Pulmonic valve regurgitation is mild by color flow Doppler.  FINDINGS  Left Ventricle: The left ventricle has normal systolic function, with an ejection fraction of  60-65%. The cavity size was normal. There is moderately increased left ventricular wall thickness. Left ventricular diastolic parameters were normal Right Ventricle: The right ventricle has normal systolic function. The cavity was normal. There is no increase in right ventricular wall thickness. Left Atrium: left atrial size was severely dilated Right Atrium: right atrial size was normal in size. Right atrial pressure is estimated at 3 mmHg. Interatrial Septum: No atrial level shunt detected by color flow Doppler. Pericardium: There is no evidence of  pericardial effusion. Mitral Valve: The mitral valve is normal in structure. There is mild mitral annular calcification present. Mitral valve regurgitation is mild by color flow Doppler. Tricuspid Valve: The tricuspid valve is normal in structure. Tricuspid valve regurgitation is mild by color flow Doppler. Aortic Valve: The aortic valve is tricuspid Mild thickening of the aortic valve Mild calcification of the aortic valve. Aortic valve regurgitation was not visualized by color flow Doppler. Pulmonic Valve: The pulmonic valve was grossly normal. Pulmonic valve regurgitation is mild by color flow Doppler.   EKG:  EKG is not ordered today.    Recent Labs: 06/17/2019: ALT 30; BUN 20; Creatinine, Ser 1.04; Hemoglobin 12.5; Platelets 215; Potassium 4.7; Sodium 143; TSH 4.460  Recent Lipid Panel    Component Value Date/Time   CHOL 170 06/17/2019 1053   TRIG 74 06/17/2019 1053   HDL 64 06/17/2019 1053   CHOLHDL 2.7 06/17/2019 1053   LDLCALC 91 06/17/2019 1053    Physical Exam:    VS:  BP (!) 180/80    Pulse 63    Ht 5' (1.524 m)    Wt 152 lb 3.2 oz (69 kg)    SpO2 94%    BMI 29.72 kg/m     Wt Readings from Last 3 Encounters:  06/17/19 152 lb 3.2 oz (69 kg)  07/04/18 152 lb (68.9 kg)  12/25/17 147 lb 12.8 oz (67 kg)     GEN: Well nourished, well developed elderly woman in no acute distress HEENT: Normal NECK: No JVD; No carotid  bruits LYMPHATICS: No lymphadenopathy CARDIAC: RRR, no murmurs, rubs, gallops RESPIRATORY:  Clear to auscultation without rales, wheezing or rhonchi  ABDOMEN: Soft, non-tender, non-distended MUSCULOSKELETAL:  No edema; No deformity  SKIN: Warm and dry NEUROLOGIC:  Alert and oriented x 3 PSYCHIATRIC:  Normal affect   ASSESSMENT:    1. Palpitations   2. Hypercholesterolemia   3. PAF (paroxysmal atrial fibrillation) (HCC)    PLAN:    In order of problems listed above:  1. Pt with increasing tachy-palpitations with associated symptoms. Will arrange a 2 week Zio patch to better understand her arrhythmia.  2. Treated with simvastatin. Will check labs.   3. Continues with a rhythm control strategy using amiodarone and anticoagulation with apixaban. Check amiodarone monitoring labs. Check CBC on chronic anticoagulation.    Medication Adjustments/Labs and Tests Ordered: Current medicines are reviewed at length with the patient today.  Concerns regarding medicines are outlined above.  Orders Placed This Encounter  Procedures   CBC with Differential/Platelet   Lipid panel   Comprehensive metabolic panel   TSH   LONG TERM MONITOR (3-14 DAYS)   No orders of the defined types were placed in this encounter.   Patient Instructions  Medication Instructions:  Your provider recommends that you continue on your current medications as directed. Please refer to the Current Medication list given to you today.    Labwork: TODAY: CMET, CBC, lipids, TSH  Testing/Procedures: Dr. Burt Knack recommends you wear a heart monitor for 2 weeks.   Follow-Up: Your provider wants you to follow-up in: 6 months with Dr. Burt Knack. You will receive a reminder letter in the mail two months in advance. If you don't receive a letter, please call our office to schedule the follow-up appointment.       Signed, Sherren Mocha, MD  06/22/2019 3:23 PM    [facility]

## 2019-06-17 NOTE — Patient Instructions (Signed)
Medication Instructions:  Your provider recommends that you continue on your current medications as directed. Please refer to the Current Medication list given to you today.    Labwork: TODAY: CMET, CBC, lipids, TSH  Testing/Procedures: Dr. Burt Knack recommends you wear a heart monitor for 2 weeks.   Follow-Up: Your provider wants you to follow-up in: 6 months with Dr. Burt Knack. You will receive a reminder letter in the mail two months in advance. If you don't receive a letter, please call our office to schedule the follow-up appointment.

## 2019-06-17 NOTE — Telephone Encounter (Signed)
14 day ZIO XT long term holter monitor to be mailed to patients home.  Instructions reviewed briefly as they are included in the monitor kit.

## 2019-06-19 ENCOUNTER — Encounter: Payer: Self-pay | Admitting: Cardiovascular Disease

## 2019-06-19 ENCOUNTER — Other Ambulatory Visit: Payer: Self-pay | Admitting: Urology

## 2019-06-19 DIAGNOSIS — D49512 Neoplasm of unspecified behavior of left kidney: Secondary | ICD-10-CM

## 2019-06-21 ENCOUNTER — Ambulatory Visit (INDEPENDENT_AMBULATORY_CARE_PROVIDER_SITE_OTHER): Payer: Medicare Other

## 2019-06-21 DIAGNOSIS — R002 Palpitations: Secondary | ICD-10-CM

## 2019-07-12 DIAGNOSIS — R002 Palpitations: Secondary | ICD-10-CM | POA: Diagnosis not present

## 2019-07-16 ENCOUNTER — Other Ambulatory Visit: Payer: Self-pay

## 2019-07-16 DIAGNOSIS — L821 Other seborrheic keratosis: Secondary | ICD-10-CM | POA: Diagnosis not present

## 2019-07-16 DIAGNOSIS — L578 Other skin changes due to chronic exposure to nonionizing radiation: Secondary | ICD-10-CM | POA: Diagnosis not present

## 2019-07-16 DIAGNOSIS — Z08 Encounter for follow-up examination after completed treatment for malignant neoplasm: Secondary | ICD-10-CM | POA: Diagnosis not present

## 2019-07-16 DIAGNOSIS — L905 Scar conditions and fibrosis of skin: Secondary | ICD-10-CM | POA: Diagnosis not present

## 2019-07-16 DIAGNOSIS — L720 Epidermal cyst: Secondary | ICD-10-CM | POA: Diagnosis not present

## 2019-07-16 DIAGNOSIS — L570 Actinic keratosis: Secondary | ICD-10-CM | POA: Diagnosis not present

## 2019-07-16 DIAGNOSIS — D1801 Hemangioma of skin and subcutaneous tissue: Secondary | ICD-10-CM | POA: Diagnosis not present

## 2019-07-16 DIAGNOSIS — Z85820 Personal history of malignant melanoma of skin: Secondary | ICD-10-CM | POA: Diagnosis not present

## 2019-07-16 DIAGNOSIS — L565 Disseminated superficial actinic porokeratosis (DSAP): Secondary | ICD-10-CM | POA: Diagnosis not present

## 2019-08-14 DIAGNOSIS — Z23 Encounter for immunization: Secondary | ICD-10-CM | POA: Diagnosis not present

## 2019-09-02 ENCOUNTER — Other Ambulatory Visit: Payer: Self-pay | Admitting: Cardiovascular Disease

## 2019-10-11 DIAGNOSIS — D49512 Neoplasm of unspecified behavior of left kidney: Secondary | ICD-10-CM | POA: Diagnosis not present

## 2019-10-15 ENCOUNTER — Ambulatory Visit
Admission: RE | Admit: 2019-10-15 | Discharge: 2019-10-15 | Disposition: A | Discharge status: Home / Self Care | Payer: Medicare Other | Source: Ambulatory Visit | Attending: Urology | Admitting: Urology

## 2019-10-15 DIAGNOSIS — D49512 Neoplasm of unspecified behavior of left kidney: Secondary | ICD-10-CM

## 2019-10-15 MED ORDER — GADOBENATE DIMEGLUMINE 529 MG/ML IV SOLN
14.0000 mL | Freq: Once | INTRAVENOUS | Status: AC | PRN
  Administered 2019-10-15: 12:00:00 14 mL via INTRAVENOUS

## 2019-11-14 ENCOUNTER — Other Ambulatory Visit: Payer: Self-pay | Admitting: Cardiovascular Disease

## 2019-11-23 ENCOUNTER — Other Ambulatory Visit: Payer: Self-pay | Admitting: Cardiovascular Disease

## 2019-11-28 ENCOUNTER — Ambulatory Visit: Payer: Medicare Other

## 2019-12-03 ENCOUNTER — Ambulatory Visit: Payer: Medicare Other

## 2019-12-06 ENCOUNTER — Ambulatory Visit (INDEPENDENT_AMBULATORY_CARE_PROVIDER_SITE_OTHER): Payer: Medicare Other | Admitting: Cardiovascular Disease

## 2019-12-06 ENCOUNTER — Other Ambulatory Visit: Payer: Self-pay

## 2019-12-06 ENCOUNTER — Encounter: Payer: Self-pay | Admitting: Cardiovascular Disease

## 2019-12-06 VITALS — BP 172/82 | HR 71 | Ht 60.0 in | Wt 152.8 lb

## 2019-12-06 DIAGNOSIS — R002 Palpitations: Secondary | ICD-10-CM

## 2019-12-06 DIAGNOSIS — I10 Essential (primary) hypertension: Secondary | ICD-10-CM

## 2019-12-06 DIAGNOSIS — I480 Paroxysmal atrial fibrillation: Secondary | ICD-10-CM | POA: Diagnosis not present

## 2019-12-06 LAB — COMPREHENSIVE METABOLIC PANEL
ALT: 30 IU/L (ref 0–32)
AST: 31 IU/L (ref 0–40)
Albumin/Globulin Ratio: 1.6 (ref 1.2–2.2)
Albumin: 4 g/dL (ref 3.6–4.6)
Alkaline Phosphatase: 119 IU/L — ABNORMAL HIGH (ref 39–117)
BUN/Creatinine Ratio: 17 (ref 12–28)
BUN: 17 mg/dL (ref 8–27)
Bilirubin Total: 0.4 mg/dL (ref 0.0–1.2)
CO2: 23 mmol/L (ref 20–29)
Calcium: 9.3 mg/dL (ref 8.7–10.3)
Chloride: 104 mmol/L (ref 96–106)
Creatinine, Ser: 1.01 mg/dL — ABNORMAL HIGH (ref 0.57–1.00)
GFR calc Af Amer: 59 mL/min/{1.73_m2} — ABNORMAL LOW (ref >59)
GFR calc non Af Amer: 51 mL/min/{1.73_m2} — ABNORMAL LOW (ref >59)
Globulin, Total: 2.5 g/dL (ref 1.5–4.5)
Glucose: 90 mg/dL (ref 65–99)
Potassium: 4.5 mmol/L (ref 3.5–5.2)
Sodium: 143 mmol/L (ref 134–144)
Total Protein: 6.5 g/dL (ref 6.0–8.5)

## 2019-12-06 LAB — TSH: TSH: 4.7 u[IU]/mL — ABNORMAL HIGH (ref 0.450–4.500)

## 2019-12-06 NOTE — Progress Notes (Signed)
Cardiology Office Note:    Date:  12/08/2019   ID:  Elizabeth Everett, DOB Mar 15, 1932, MRN [id number]  PCP:  Street, Sharon Mt, MD  Cardiologist:  Sherren Mocha, MD  Electrophysiologist:  None   Referring MD: Street, Sharon Mt, *   Chief Complaint  Patient presents with  . Palpitations    History of Present Illness:    Elizabeth Everett is a 86 y.o. female with a hx of paroxysmal atrial fibrillation, presenting for follow-up evaluation.  The patient has been maintained on amiodarone and apixaban now for many years.  She has a longstanding history of labile whitecoat hypertension.  She is here with her husband today. She has recently been having problems with 'diverticulitis.'  She describes this as a lower abdominal pain that is now resolved.  She did not seek medical attention.  She denies chest pain, chest pressure, or shortness of breath.  She denies leg swelling, orthopnea, or PND.  She has had no recent episodes of lightheadedness or near syncope.  She does have intermittent heart palpitations that are essentially unchanged.  She describes a hard heartbeat that occurs and she rests for about 3 minutes, generally with full resolution of symptoms.  She also describes a rapid heartbeat that requires her to cough in order to break it.  This is fleeting and almost always resolves with coughing.  She underwent outpatient event monitoring that showed a few episodes of supraventricular runs but no sustained arrhythmia.  Past Medical History:  Diagnosis Date  . Acute respiratory failure with hypoxia and hypercapnia (HCC)   . Arthritis   . Atrial fibrillation ([place])   . Baker's cyst of knee    right   . Bradycardia   . Cancer ([place])    skin cancer; endometrial cancer   . Colonic pseudoobstruction   . Dizziness   . Dysrhythmia   . Edema of lower extremity    bilat   . GERD (gastroesophageal reflux disease)   . Hard of hearing   . Heart murmur   . History of atrial flutter   .  History of bronchitis   . History of kidney stones   . Hypercholesteremia   . Hypertension   . Macular degeneration   . Multiple falls   . Pancreatitis   . Scoliosis   . Shortness of breath dyspnea    can not climb full set of steps without stopping   . Toxic metabolic encephalopathy     Past Surgical History:  Procedure Laterality Date  . CARDIOVERSION    . DILATION AND CURETTAGE OF UTERUS    . FOOT SURGERY     bone surgery  . KNEE ARTHROSCOPY Right   . TONSILLECTOMY    . TOTAL KNEE ARTHROPLASTY Right 12/01/2015   Procedure: RIGHT TOTAL KNEE ARTHROPLASTY;  Surgeon: Paralee Cancel, MD;  Location: [facility];  Service: Orthopedics;  Laterality: Right;  Marland Kitchen VAGINAL HYSTERECTOMY      Current Medications: Current Meds  Medication Sig  . amiodarone (PACERONE) 200 MG tablet Take 1 tablet (200 mg total) by mouth daily.  Marland Kitchen apixaban (ELIQUIS) 2.5 MG TABS tablet Take 2.5 mg by mouth 2 (two) times daily.  Marland Kitchen FOLIC ACID PO Take 1 tablet by mouth daily.  . furosemide (LASIX) 20 MG tablet Take 1 tablet (20 mg total) by mouth every other day.  . irbesartan (AVAPRO) 150 MG tablet Take 0.5 tablets (75 mg total) by mouth daily.  Marland Kitchen KLOR-CON 10 10 MEQ tablet TAKE 1 TABLET  EVERY OTHER DAY  . metoprolol tartrate (LOPRESSOR) 25 MG tablet Take 1 tablet (25 mg total) by mouth 2 (two) times daily.  . Multiple Vitamins-Minerals (EYE VITAMINS PO) Take 2 tablets by mouth daily.   . pantoprazole (PROTONIX) 40 MG tablet TAKE 1 TABLET BY MOUTH EVERY DAY  . simvastatin (ZOCOR) 20 MG tablet TAKE 1 TABLET BY MOUTH EVERY DAY  . VITAMIN E PO Take 1 capsule by mouth every other day.     Allergies:   Evista [raloxifene], Morphine, Statins, Erythromycin, and Gentamicin   Social History   Socioeconomic History  . Marital status: Married    Spouse name: Not on file  . Number of children: Not on file  . Years of education: Not on file  . Highest education level: Not on file  Occupational History  . Occupation:  Retired  Tobacco Use  . Smoking status: Never Smoker  . Smokeless tobacco: Never Used  Substance and Sexual Activity  . Alcohol use: Yes    Alcohol/week: 0.0 standard drinks    Comment: 3 glasses of wine monthly  . Drug use: No  . Sexual activity: Not on file  Other Topics Concern  . Not on file  Social History Narrative  . Not on file   Social Determinants of Health   Financial Resource Strain:   . Difficulty of Paying Living Expenses: Not on file  Food Insecurity:   . Worried About Charity fundraiser in the Last Year: Not on file  . Ran Out of Food in the Last Year: Not on file  Transportation Needs:   . Lack of Transportation (Medical): Not on file  . Lack of Transportation (Non-Medical): Not on file  Physical Activity:   . Days of Exercise per Week: Not on file  . Minutes of Exercise per Session: Not on file  Stress:   . Feeling of Stress : Not on file  Social Connections:   . Frequency of Communication with Friends and Family: Not on file  . Frequency of Social Gatherings with Friends and Family: Not on file  . Attends Religious Services: Not on file  . Active Member of Clubs or Organizations: Not on file  . Attends Archivist Meetings: Not on file  . Marital Status: Not on file     Family History: [name] family history includes Lymphoma in her father; Macular degeneration in her father and mother.  ROS:   Please see the history of present illness.    All other systems reviewed and are negative.  EKGs/Labs/Other Studies Reviewed:    The following studies were reviewed today: Echo 01-04-2019: IMPRESSIONS    1. The left ventricle has normal systolic function with an ejection  fraction of 60-65%. The cavity size was normal. There is moderately  increased left ventricular wall thickness. Left ventricular diastolic  parameters were normal.  2. The right ventricle has normal systolic function. The cavity was  normal. There is no increase in  right ventricular wall thickness.  3. Left atrial size was severely dilated.  4. The mitral valve is normal in structure. There is mild mitral annular  calcification present.  5. The tricuspid valve is normal in structure.  6. The aortic valve is tricuspid Mild thickening of the aortic valve Mild  calcification of the aortic valve.  7. The pulmonic valve was grossly normal. Pulmonic valve regurgitation is  mild by color flow Doppler.   EKG:  EKG is not ordered today.   Recent Labs:  06/17/2019: Hemoglobin 12.5; Platelets 215 12/06/2019: ALT 30; BUN 17; Creatinine, Ser 1.01; Potassium 4.5; Sodium 143; TSH 4.700  Recent Lipid Panel    Component Value Date/Time   CHOL 170 06/17/2019 1053   TRIG 74 06/17/2019 1053   HDL 64 06/17/2019 1053   CHOLHDL 2.7 06/17/2019 1053   LDLCALC 91 06/17/2019 1053    Physical Exam:    VS:  BP (!) 172/82   Pulse 71   Ht 5' (1.524 m)   Wt 152 lb 12.8 oz (69.3 kg)   SpO2 95%   BMI 29.84 kg/m     Wt Readings from Last 3 Encounters:  12/06/19 152 lb 12.8 oz (69.3 kg)  06/17/19 152 lb 3.2 oz (69 kg)  07/04/18 152 lb (68.9 kg)     GEN:  Well nourished, well developed in no acute distress HEENT: Normal NECK: No JVD; No carotid bruits LYMPHATICS: No lymphadenopathy CARDIAC: RRR, no murmurs, rubs, gallops RESPIRATORY:  Clear to auscultation without rales, wheezing or rhonchi  ABDOMEN: Soft, non-tender, non-distended MUSCULOSKELETAL:  1+ bilateral ankle edema; No deformity  SKIN: Warm and dry NEUROLOGIC:  Alert and oriented x 3 PSYCHIATRIC:  Normal affect   ASSESSMENT:    1. Palpitations   2. PAF (paroxysmal atrial fibrillation) ([place])   3. Essential hypertension    PLAN:    In order of problems listed above:  1. No change in symptoms.  Outpatient monitor reviewed as above with no sustained arrhythmia or atrial fibrillation documented.  The patient is advised she can take an extra metoprolol if needed.  Otherwise she will continue on  her current medical program without change.  This includes amiodarone and metoprolol tartrate. 2. Appears stable.  Will check LFTs and TSH for amiodarone monitoring.  Continue apixaban for anticoagulation.  Continue metoprolol. 3. Lengthy discussion today about blood pressure.  We specifically discussed sodium restriction.  She does not add much salt but does eat a lot of canned food high in sodium content.  She will try to work on this.  Home blood pressure readings are in the 140s over 70s and 80s.  They will continue to monitor.  I asked them to contact us if home blood pressure drifts up into the 150s and she will require more antihypertensive medication if that were to occur.  We discussed the fact that there is a balance of over treating her and achieving goal blood pressure.  She has previously had a few falls that were related to symptomatic hypotension and her antihypertensive drugs were reduced at that time.  She has had no recurrent problems with that since cutting back on her medication.   Medication Adjustments/Labs and Tests Ordered: Current medicines are reviewed at length with the patient today.  Concerns regarding medicines are outlined above.  Orders Placed This Encounter  Procedures  . Comprehensive metabolic panel  . TSH  . EKG 12-Lead   No orders of the defined types were placed in this encounter.   Patient Instructions  Medication Instructions:  1) You may take an extra dose of metoprolol if you are having palpitations. 2) Otherwise, continue on your current medications as directed. Please refer to the Current Medication list given to you today.    *If you need a refill on your cardiac medications before your next appointment, please call your pharmacy*  Lab Work: TODAY: CMET, TSH If you have labs (blood work) drawn today and your tests are completely normal, you will receive your results only by: Marland Kitchen MyChart  Message (if you have MyChart) OR . A paper copy in the  mail If you have any lab test that is abnormal or we need to change your treatment, we will call you to review the results.  Follow-Up: At [facility], you and your health needs are our priority.  As part of our continuing mission to provide you with exceptional heart care, we have created designated Provider Care Teams.  These Care Teams include your primary Cardiologist (physician) and Advanced Practice Providers (APPs -  Physician Assistants and Nurse Practitioners) who all work together to provide you with the care you need, when you need it. Your next appointment:   12 month(s) The format for your next appointment:   In Person Provider:   You may see Sherren Mocha, MD or one of the following Advanced Practice Providers on your designated Care Team:    Richardson Dopp, PA-C  Vin Pleak, PA-C  Daune Perch, [place]    Signed, Sherren Mocha, MD  12/08/2019 9:55 AM    [place]

## 2019-12-06 NOTE — Patient Instructions (Signed)
Medication Instructions:  1) You may take an extra dose of metoprolol if you are having palpitations. 2) Otherwise, continue on your current medications as directed. Please refer to the Current Medication list given to you today.    *If you need a refill on your cardiac medications before your next appointment, please call your pharmacy*  Lab Work: TODAY: CMET, TSH If you have labs (blood work) drawn today and your tests are completely normal, you will receive your results only by: Marland Kitchen MyChart Message (if you have MyChart) OR . A paper copy in the mail If you have any lab test that is abnormal or we need to change your treatment, we will call you to review the results.  Follow-Up: At [facility], you and your health needs are our priority.  As part of our continuing mission to provide you with exceptional heart care, we have created designated Provider Care Teams.  These Care Teams include your primary Cardiologist (physician) and Advanced Practice Providers (APPs -  Physician Assistants and Nurse Practitioners) who all work together to provide you with the care you need, when you need it. Your next appointment:   12 month(s) The format for your next appointment:   In Person Provider:   You may see Sherren Mocha, MD or one of the following Advanced Practice Providers on your designated Care Team:    Richardson Dopp, PA-C  Vin [place], [place]  Daune Perch, [place]

## 2019-12-23 ENCOUNTER — Other Ambulatory Visit: Payer: Self-pay | Admitting: Cardiovascular Disease

## 2020-01-28 DIAGNOSIS — L821 Other seborrheic keratosis: Secondary | ICD-10-CM | POA: Diagnosis not present

## 2020-01-28 DIAGNOSIS — L905 Scar conditions and fibrosis of skin: Secondary | ICD-10-CM | POA: Diagnosis not present

## 2020-01-28 DIAGNOSIS — L570 Actinic keratosis: Secondary | ICD-10-CM | POA: Diagnosis not present

## 2020-02-05 ENCOUNTER — Other Ambulatory Visit: Payer: Self-pay | Admitting: Cardiovascular Disease

## 2020-02-05 NOTE — Telephone Encounter (Signed)
Pt last saw Dr Burt Knack 12/06/19, last labs 12/06/19 Creat 1.01, age 86, weight 69.3kg, based on specified criteria pt is on appropriate dosage of Eliquis 5mg  BID.  Will refill rx.

## 2020-02-17 DIAGNOSIS — H52223 Regular astigmatism, bilateral: Secondary | ICD-10-CM | POA: Diagnosis not present

## 2020-02-17 DIAGNOSIS — H01012 Ulcerative blepharitis right lower eyelid: Secondary | ICD-10-CM | POA: Diagnosis not present

## 2020-02-17 DIAGNOSIS — H31092 Other chorioretinal scars, left eye: Secondary | ICD-10-CM | POA: Diagnosis not present

## 2020-02-17 DIAGNOSIS — H353212 Exudative age-related macular degeneration, right eye, with inactive choroidal neovascularization: Secondary | ICD-10-CM | POA: Diagnosis not present

## 2020-02-17 DIAGNOSIS — H35363 Drusen (degenerative) of macula, bilateral: Secondary | ICD-10-CM | POA: Diagnosis not present

## 2020-02-17 DIAGNOSIS — H25811 Combined forms of age-related cataract, right eye: Secondary | ICD-10-CM | POA: Diagnosis not present

## 2020-02-17 DIAGNOSIS — H25812 Combined forms of age-related cataract, left eye: Secondary | ICD-10-CM | POA: Diagnosis not present

## 2020-02-17 DIAGNOSIS — H43811 Vitreous degeneration, right eye: Secondary | ICD-10-CM | POA: Diagnosis not present

## 2020-02-17 DIAGNOSIS — H353223 Exudative age-related macular degeneration, left eye, with inactive scar: Secondary | ICD-10-CM | POA: Diagnosis not present

## 2020-02-17 DIAGNOSIS — H01015 Ulcerative blepharitis left lower eyelid: Secondary | ICD-10-CM | POA: Diagnosis not present

## 2020-02-27 DIAGNOSIS — I4892 Unspecified atrial flutter: Secondary | ICD-10-CM | POA: Diagnosis not present

## 2020-02-27 DIAGNOSIS — E039 Hypothyroidism, unspecified: Secondary | ICD-10-CM | POA: Diagnosis not present

## 2020-02-27 DIAGNOSIS — E785 Hyperlipidemia, unspecified: Secondary | ICD-10-CM | POA: Diagnosis not present

## 2020-02-27 DIAGNOSIS — Z0000 Encounter for general adult medical examination without abnormal findings: Secondary | ICD-10-CM | POA: Diagnosis not present

## 2020-02-27 DIAGNOSIS — I10 Essential (primary) hypertension: Secondary | ICD-10-CM | POA: Diagnosis not present

## 2020-02-28 DIAGNOSIS — Z79899 Other long term (current) drug therapy: Secondary | ICD-10-CM | POA: Diagnosis not present

## 2020-02-28 DIAGNOSIS — R739 Hyperglycemia, unspecified: Secondary | ICD-10-CM | POA: Diagnosis not present

## 2020-02-28 DIAGNOSIS — E039 Hypothyroidism, unspecified: Secondary | ICD-10-CM | POA: Diagnosis not present

## 2020-02-28 DIAGNOSIS — E785 Hyperlipidemia, unspecified: Secondary | ICD-10-CM | POA: Diagnosis not present

## 2020-03-03 DIAGNOSIS — H25811 Combined forms of age-related cataract, right eye: Secondary | ICD-10-CM | POA: Diagnosis not present

## 2020-03-03 DIAGNOSIS — H269 Unspecified cataract: Secondary | ICD-10-CM | POA: Diagnosis not present

## 2020-03-03 DIAGNOSIS — H25812 Combined forms of age-related cataract, left eye: Secondary | ICD-10-CM | POA: Diagnosis not present

## 2020-03-04 DIAGNOSIS — Z9842 Cataract extraction status, left eye: Secondary | ICD-10-CM | POA: Diagnosis not present

## 2020-03-19 MED ORDER — APIXABAN 5 MG PO TABS: 5.0000 mg | ORAL_TABLET | Freq: Two times a day (BID) | ORAL | 3 refills | Status: DC

## 2020-03-19 MED ORDER — FUROSEMIDE 20 MG PO TABS: 20.0000 mg | ORAL_TABLET | Freq: Every day | ORAL | 11 refills | Status: DC

## 2020-03-19 MED ORDER — IRBESARTAN 150 MG PO TABS: 75.0000 mg | ORAL_TABLET | Freq: Every evening | ORAL | 11 refills | Status: DC

## 2020-03-26 DIAGNOSIS — H25811 Combined forms of age-related cataract, right eye: Secondary | ICD-10-CM | POA: Diagnosis not present

## 2020-03-26 DIAGNOSIS — H2511 Age-related nuclear cataract, right eye: Secondary | ICD-10-CM | POA: Diagnosis not present

## 2020-03-26 DIAGNOSIS — H25812 Combined forms of age-related cataract, left eye: Secondary | ICD-10-CM | POA: Diagnosis not present

## 2020-03-26 DIAGNOSIS — Z9841 Cataract extraction status, right eye: Secondary | ICD-10-CM | POA: Diagnosis not present

## 2020-04-25 ENCOUNTER — Other Ambulatory Visit: Payer: Self-pay | Admitting: Cardiovascular Disease

## 2020-04-28 IMAGING — DX DG CHEST 2V
2 series · 2 of 2 positions shown · non-contrast
Comparison: 10/03/2011

CLINICAL DATA: Pt c/o follow up paroxysmal atrial fibrillation. Pt
has no complaints. Hx of heart murmur, HTN.

EXAM:
CHEST - 2 VIEW

[dg chest 2 view (1 of 2)]
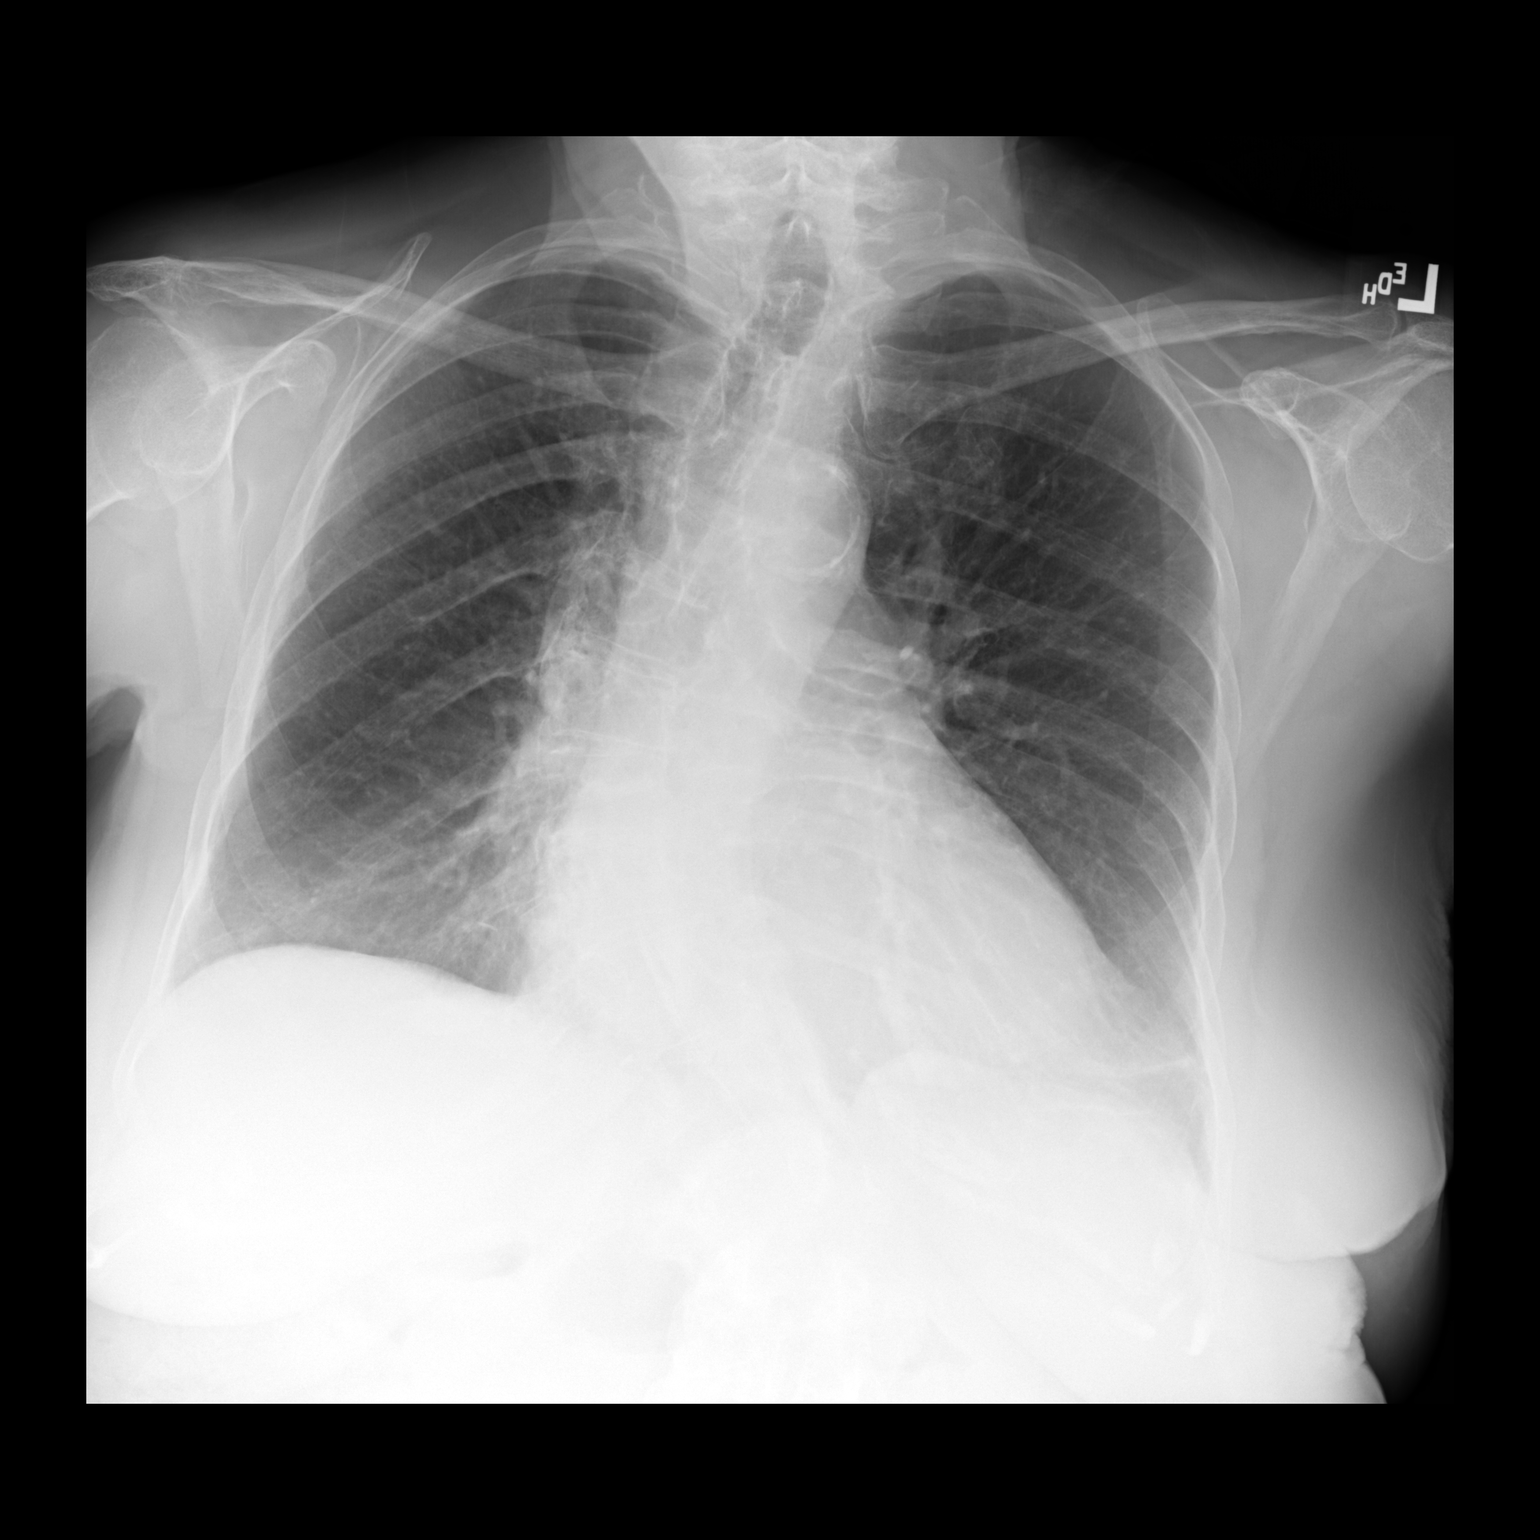

[dg chest 2 view (2 of 2)]
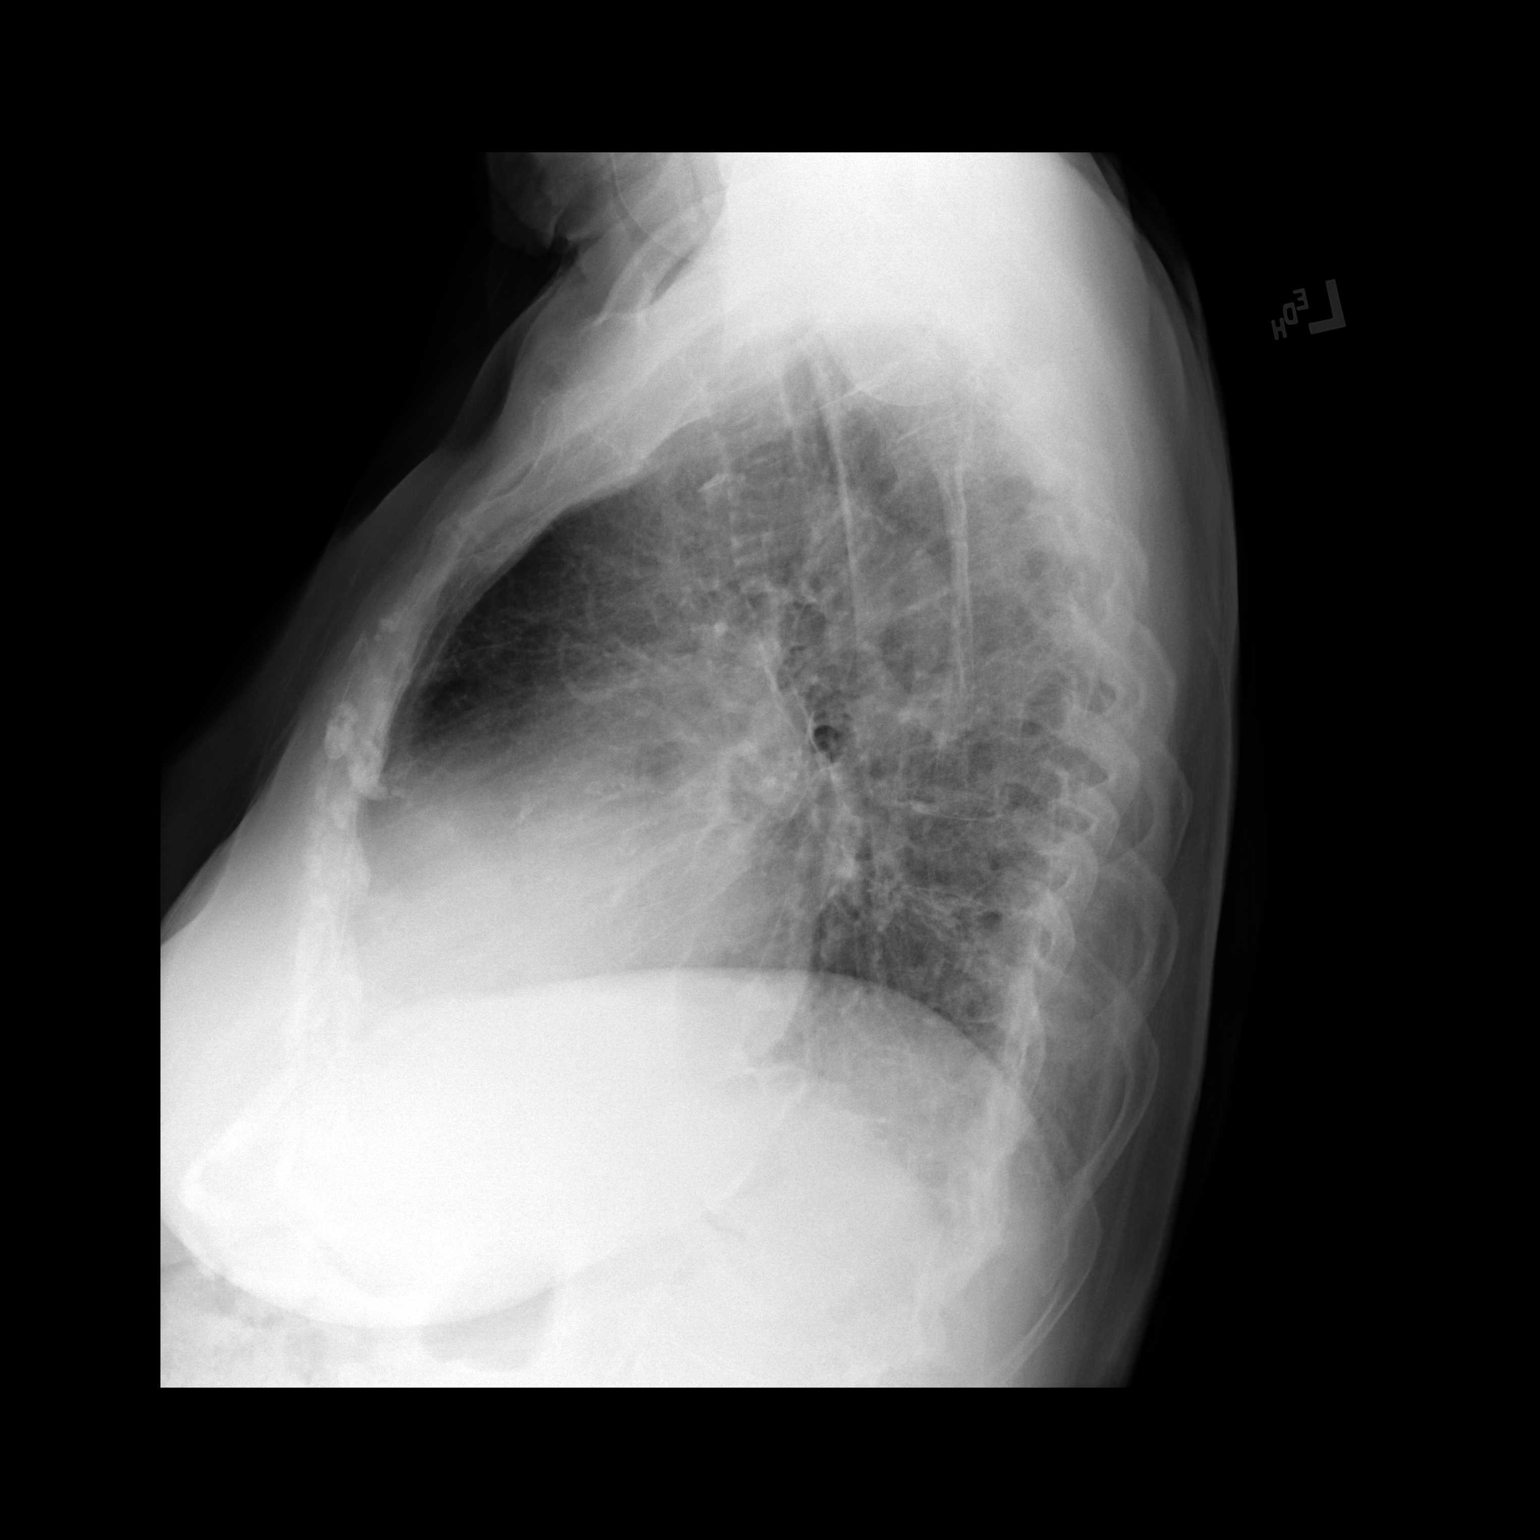

[2 of 2 positions shown; findings below may reference images not displayed]

FINDINGS: Mild enlargement of the cardiopericardial silhouette. No mediastinal
or hilar masses. No convincing adenopathy.

Clear lungs.  No pleural effusion or pneumothorax.

Prominent S-shaped thoracolumbar scoliosis, stable. Skeletal
structures are diffusely demineralized.
IMPRESSION: No acute cardiopulmonary disease.

## 2020-05-19 DIAGNOSIS — D6869 Other thrombophilia: Secondary | ICD-10-CM | POA: Diagnosis not present

## 2020-05-19 DIAGNOSIS — I4892 Unspecified atrial flutter: Secondary | ICD-10-CM | POA: Diagnosis not present

## 2020-05-19 DIAGNOSIS — N1831 Chronic kidney disease, stage 3a: Secondary | ICD-10-CM | POA: Diagnosis not present

## 2020-05-19 DIAGNOSIS — Z79899 Other long term (current) drug therapy: Secondary | ICD-10-CM | POA: Diagnosis not present

## 2020-05-19 DIAGNOSIS — E039 Hypothyroidism, unspecified: Secondary | ICD-10-CM | POA: Diagnosis not present

## 2020-06-03 DIAGNOSIS — H353232 Exudative age-related macular degeneration, bilateral, with inactive choroidal neovascularization: Secondary | ICD-10-CM | POA: Diagnosis not present

## 2020-06-09 DIAGNOSIS — C44622 Squamous cell carcinoma of skin of right upper limb, including shoulder: Secondary | ICD-10-CM | POA: Diagnosis not present

## 2020-07-17 ENCOUNTER — Other Ambulatory Visit: Payer: Self-pay | Admitting: Cardiovascular Disease

## 2020-07-21 DIAGNOSIS — N6310 Unspecified lump in the right breast, unspecified quadrant: Secondary | ICD-10-CM | POA: Diagnosis not present

## 2020-07-21 DIAGNOSIS — N644 Mastodynia: Secondary | ICD-10-CM | POA: Diagnosis not present

## 2020-07-21 DIAGNOSIS — I10 Essential (primary) hypertension: Secondary | ICD-10-CM | POA: Diagnosis not present

## 2020-07-21 DIAGNOSIS — Z8542 Personal history of malignant neoplasm of other parts of uterus: Secondary | ICD-10-CM | POA: Diagnosis not present

## 2020-07-28 DIAGNOSIS — D1801 Hemangioma of skin and subcutaneous tissue: Secondary | ICD-10-CM | POA: Diagnosis not present

## 2020-07-28 DIAGNOSIS — L570 Actinic keratosis: Secondary | ICD-10-CM | POA: Diagnosis not present

## 2020-07-28 DIAGNOSIS — Z8589 Personal history of malignant neoplasm of other organs and systems: Secondary | ICD-10-CM | POA: Diagnosis not present

## 2020-07-28 DIAGNOSIS — Z85828 Personal history of other malignant neoplasm of skin: Secondary | ICD-10-CM | POA: Diagnosis not present

## 2020-07-28 DIAGNOSIS — L578 Other skin changes due to chronic exposure to nonionizing radiation: Secondary | ICD-10-CM | POA: Diagnosis not present

## 2020-07-28 DIAGNOSIS — L821 Other seborrheic keratosis: Secondary | ICD-10-CM | POA: Diagnosis not present

## 2020-07-28 DIAGNOSIS — L720 Epidermal cyst: Secondary | ICD-10-CM | POA: Diagnosis not present

## 2020-07-29 DIAGNOSIS — N6325 Unspecified lump in the left breast, overlapping quadrants: Secondary | ICD-10-CM | POA: Diagnosis not present

## 2020-07-29 DIAGNOSIS — N644 Mastodynia: Secondary | ICD-10-CM | POA: Diagnosis not present

## 2020-08-06 DIAGNOSIS — R928 Other abnormal and inconclusive findings on diagnostic imaging of breast: Secondary | ICD-10-CM | POA: Diagnosis not present

## 2020-08-06 DIAGNOSIS — N641 Fat necrosis of breast: Secondary | ICD-10-CM | POA: Diagnosis not present

## 2020-08-06 DIAGNOSIS — N6321 Unspecified lump in the left breast, upper outer quadrant: Secondary | ICD-10-CM | POA: Diagnosis not present

## 2020-08-13 DIAGNOSIS — Z23 Encounter for immunization: Secondary | ICD-10-CM | POA: Diagnosis not present

## 2020-08-31 DIAGNOSIS — N6321 Unspecified lump in the left breast, upper outer quadrant: Secondary | ICD-10-CM | POA: Diagnosis not present

## 2020-09-01 ENCOUNTER — Telehealth: Payer: Self-pay | Admitting: *Deleted

## 2020-09-01 NOTE — Telephone Encounter (Signed)
Clinical pharmacist to review Eliquis 

## 2020-09-01 NOTE — Telephone Encounter (Signed)
° °  [facility] Pre-operative Risk Assessment    HEARTCARE STAFF: - Please ensure there is not already an duplicate clearance open for this procedure. - Under Visit Info/Reason for Call, type in Other and utilize the format Clearance MM/DD/YY or Clearance TBD. Do not use dashes or single digits. - If request is for dental extraction, please clarify the # of teeth to be extracted.  Request for surgical clearance: URGENT  1. What type of surgery is being performed? LEFT NEEDLE LUMPECTOMY FOR BREAST MASS   2. When is this surgery scheduled? TBD   3. What type of clearance is required (medical clearance vs. Pharmacy clearance to hold med vs. Both)? BOTH  4. Are there any medications that need to be held prior to surgery and how long? ELIQUIS TO HOLD 48-72 HOURS    5. Practice name and name of physician performing surgery? [facility]; DR. Noberto Retort   6. What is the office phone number? [phone]   7.   What is the office fax number? [phone]  8.   Anesthesia type (None, local, MAC, general) ? NOT LISTED   Julaine Hua 09/01/2020, 12:51 PM  _________________________________________________________________   (provider comments below)

## 2020-09-01 NOTE — Telephone Encounter (Signed)
Hey

## 2020-09-01 NOTE — Telephone Encounter (Signed)
I apologize for the previous message, that was sent in error. The patient and her husband are requesting appointments with Dr. Burt Knack.

## 2020-09-01 NOTE — Telephone Encounter (Signed)
Patient with diagnosis of afib on Eliquis for anticoagulation.    Procedure: LEFT NEEDLE LUMPECTOMY FOR BREAST MASS  Date of procedure: TBD    CHA2DS2-VASc Score = 4  This indicates a 4.8% annual risk of stroke. [name] score is based upon: CHF History: 0 HTN History: 1 Diabetes History: 0 Stroke History: 0 Vascular Disease History: 0 Age Score: 2 Gender Score: 1      CrCl 34 ml/min  Per office protocol, patient can hold Eliquis for 2 days prior to procedure.

## 2020-09-02 NOTE — Telephone Encounter (Signed)
Yes. Would proceed. No CV testing indicated. thanks

## 2020-09-02 NOTE — Telephone Encounter (Signed)
   Primary Cardiologist: Sherren Mocha, MD  Chart reviewed as part of pre-operative protocol coverage. Patient was contacted 09/02/2020 in reference to pre-operative risk assessment for pending surgery as outlined below.  Elizabeth Everett was last seen on 12/06/2019 by Dr. Burt Knack.  Since that day, Elizabeth Everett has done well.  Therefore, based on ACC/AHA guidelines, the patient would be at acceptable risk for the planned procedure without further cardiovascular testing.   The patient was advised that if she develops new symptoms prior to surgery to contact our office to arrange for a follow-up visit, and she verbalized understanding.  I will route this recommendation to the requesting party via Epic fax function and remove from pre-op pool. Please call with questions.  Per our clinical pharmacist, she can hold Eliquis for 2 days prior to the procedure and restart as soon as possible after the procedure at the surgeon's discretion.   [place], [place] 09/02/2020, 1:58 PM

## 2020-09-02 NOTE — Telephone Encounter (Signed)
Dr. Burt Knack to review. I spoke with Elizabeth Everett with her husband's help because of her poor hearing. She has a h/o PAF and HTN. No recent chest pain or shortness of breath. Despite her claim of not being very active, she is able to do all the house chores, climb up 2 flights of stairs slowly and walk slowly about 15 min without interruption. Would you be ok with clearing the patient for the urgent procedure without further workup. Surgery is scheduled for 11/17th.

## 2020-09-09 DIAGNOSIS — E039 Hypothyroidism, unspecified: Secondary | ICD-10-CM | POA: Diagnosis not present

## 2020-09-09 DIAGNOSIS — Z23 Encounter for immunization: Secondary | ICD-10-CM | POA: Diagnosis not present

## 2020-09-10 DIAGNOSIS — Z20822 Contact with and (suspected) exposure to covid-19: Secondary | ICD-10-CM | POA: Diagnosis not present

## 2020-09-10 DIAGNOSIS — Z01812 Encounter for preprocedural laboratory examination: Secondary | ICD-10-CM | POA: Diagnosis not present

## 2020-09-10 DIAGNOSIS — N6321 Unspecified lump in the left breast, upper outer quadrant: Secondary | ICD-10-CM | POA: Diagnosis not present

## 2020-09-16 DIAGNOSIS — Z7901 Long term (current) use of anticoagulants: Secondary | ICD-10-CM | POA: Diagnosis not present

## 2020-09-16 DIAGNOSIS — E039 Hypothyroidism, unspecified: Secondary | ICD-10-CM | POA: Diagnosis not present

## 2020-09-16 DIAGNOSIS — N6321 Unspecified lump in the left breast, upper outer quadrant: Secondary | ICD-10-CM | POA: Diagnosis not present

## 2020-09-16 DIAGNOSIS — N6012 Diffuse cystic mastopathy of left breast: Secondary | ICD-10-CM | POA: Diagnosis not present

## 2020-09-16 DIAGNOSIS — I4891 Unspecified atrial fibrillation: Secondary | ICD-10-CM | POA: Diagnosis not present

## 2020-09-16 DIAGNOSIS — H5461 Unqualified visual loss, right eye, normal vision left eye: Secondary | ICD-10-CM | POA: Diagnosis not present

## 2020-09-16 DIAGNOSIS — E785 Hyperlipidemia, unspecified: Secondary | ICD-10-CM | POA: Diagnosis not present

## 2020-09-16 DIAGNOSIS — K219 Gastro-esophageal reflux disease without esophagitis: Secondary | ICD-10-CM | POA: Diagnosis not present

## 2020-09-16 DIAGNOSIS — I10 Essential (primary) hypertension: Secondary | ICD-10-CM | POA: Diagnosis not present

## 2020-10-02 ENCOUNTER — Other Ambulatory Visit: Payer: Self-pay | Admitting: Urology

## 2020-10-02 DIAGNOSIS — D49512 Neoplasm of unspecified behavior of left kidney: Secondary | ICD-10-CM

## 2020-10-05 DIAGNOSIS — D49512 Neoplasm of unspecified behavior of left kidney: Secondary | ICD-10-CM | POA: Diagnosis not present

## 2020-10-07 ENCOUNTER — Other Ambulatory Visit: Payer: Self-pay | Admitting: Urology

## 2020-10-07 DIAGNOSIS — D49512 Neoplasm of unspecified behavior of left kidney: Secondary | ICD-10-CM

## 2020-10-08 ENCOUNTER — Other Ambulatory Visit: Payer: Self-pay | Admitting: Urology

## 2020-10-08 ENCOUNTER — Other Ambulatory Visit: Payer: Self-pay

## 2020-10-08 ENCOUNTER — Ambulatory Visit (HOSPITAL_COMMUNITY): Admission: RE | Admit: 2020-10-08 | Payer: Medicare Other | Source: Ambulatory Visit

## 2020-10-08 ENCOUNTER — Ambulatory Visit
Admission: RE | Admit: 2020-10-08 | Discharge: 2020-10-08 | Disposition: A | Discharge status: Home / Self Care | Payer: Medicare Other | Source: Ambulatory Visit | Attending: Urology | Admitting: Urology

## 2020-10-08 DIAGNOSIS — K8689 Other specified diseases of pancreas: Secondary | ICD-10-CM | POA: Diagnosis not present

## 2020-10-08 DIAGNOSIS — Z853 Personal history of malignant neoplasm of breast: Secondary | ICD-10-CM | POA: Diagnosis not present

## 2020-10-08 DIAGNOSIS — D49512 Neoplasm of unspecified behavior of left kidney: Secondary | ICD-10-CM

## 2020-10-08 DIAGNOSIS — K449 Diaphragmatic hernia without obstruction or gangrene: Secondary | ICD-10-CM | POA: Diagnosis not present

## 2020-10-08 DIAGNOSIS — Q453 Other congenital malformations of pancreas and pancreatic duct: Secondary | ICD-10-CM | POA: Diagnosis not present

## 2020-10-08 MED ORDER — GADOBENATE DIMEGLUMINE 529 MG/ML IV SOLN
14.0000 mL | Freq: Once | INTRAVENOUS | Status: AC | PRN
  Administered 2020-10-08: 14 mL via INTRAVENOUS

## 2020-10-09 ENCOUNTER — Ambulatory Visit (HOSPITAL_COMMUNITY): Admission: RE | Admit: 2020-10-09 | Payer: Medicare Other | Source: Ambulatory Visit

## 2020-10-09 DIAGNOSIS — D49512 Neoplasm of unspecified behavior of left kidney: Secondary | ICD-10-CM | POA: Diagnosis not present

## 2020-10-11 ENCOUNTER — Other Ambulatory Visit: Payer: Self-pay | Admitting: Cardiovascular Disease

## 2020-11-11 ENCOUNTER — Other Ambulatory Visit: Payer: Self-pay | Admitting: Cardiovascular Disease

## 2020-11-26 DIAGNOSIS — E785 Hyperlipidemia, unspecified: Secondary | ICD-10-CM | POA: Diagnosis not present

## 2020-11-26 DIAGNOSIS — E039 Hypothyroidism, unspecified: Secondary | ICD-10-CM | POA: Diagnosis not present

## 2020-11-26 DIAGNOSIS — R7302 Impaired glucose tolerance (oral): Secondary | ICD-10-CM | POA: Diagnosis not present

## 2020-11-26 DIAGNOSIS — I4892 Unspecified atrial flutter: Secondary | ICD-10-CM | POA: Diagnosis not present

## 2020-11-26 DIAGNOSIS — I10 Essential (primary) hypertension: Secondary | ICD-10-CM | POA: Diagnosis not present

## 2020-12-11 ENCOUNTER — Other Ambulatory Visit: Payer: Self-pay | Admitting: Cardiovascular Disease

## 2020-12-28 ENCOUNTER — Encounter: Payer: Self-pay | Admitting: Cardiovascular Disease

## 2020-12-28 ENCOUNTER — Ambulatory Visit (INDEPENDENT_AMBULATORY_CARE_PROVIDER_SITE_OTHER): Payer: Medicare Other | Admitting: Cardiovascular Disease

## 2020-12-28 ENCOUNTER — Ambulatory Visit
Admission: RE | Admit: 2020-12-28 | Discharge: 2020-12-28 | Disposition: A | Discharge status: Home / Self Care | Payer: Medicare Other | Source: Ambulatory Visit | Attending: Cardiovascular Disease | Admitting: Cardiovascular Disease

## 2020-12-28 ENCOUNTER — Other Ambulatory Visit: Payer: Self-pay

## 2020-12-28 VITALS — BP 120/70 | HR 52 | Ht 60.0 in | Wt 149.4 lb

## 2020-12-28 DIAGNOSIS — I480 Paroxysmal atrial fibrillation: Secondary | ICD-10-CM | POA: Diagnosis not present

## 2020-12-28 DIAGNOSIS — I517 Cardiomegaly: Secondary | ICD-10-CM | POA: Diagnosis not present

## 2020-12-28 DIAGNOSIS — E7800 Pure hypercholesterolemia, unspecified: Secondary | ICD-10-CM | POA: Diagnosis not present

## 2020-12-28 DIAGNOSIS — I700 Atherosclerosis of aorta: Secondary | ICD-10-CM | POA: Diagnosis not present

## 2020-12-28 DIAGNOSIS — R002 Palpitations: Secondary | ICD-10-CM

## 2020-12-28 DIAGNOSIS — J984 Other disorders of lung: Secondary | ICD-10-CM | POA: Diagnosis not present

## 2020-12-28 DIAGNOSIS — I10 Essential (primary) hypertension: Secondary | ICD-10-CM

## 2020-12-28 NOTE — Progress Notes (Signed)
Cardiology Office Note:    Date:  12/28/2020   ID:  Elizabeth Everett, DOB 05-22-1932, MRN [id number]  PCP:  Street, [place], [place]  Cardiologist:  Sherren Mocha, MD  Advanced Practice Provider:  No care team member to display Electrophysiologist:  None  [id number]   Referring MD: Street, [place], *   Chief Complaint  Patient presents with  . Palpitations    History of Present Illness:    Elizabeth Everett is a 87 y.o. female with a hx of paroxysmal atrial fibrillation and hypertension, presenting for follow-up evaluation.  She is here with her husband today. She reports an episode of 'pounding heart' every 4-5 days. She describes 2 different sensations, one is a pounding and the other is a 'fluttering.' The fluttering sensation goes away with cough/valsalva. The other sensation goes away with rest after a few minutes. She also complains of dyspnea with exertion occurring with activities like walking in the mall. No chest pain or pressure. No recent lightheadedness, syncope, mechanical falls.   Past Medical History:  Diagnosis Date  . Acute respiratory failure with hypoxia and hypercapnia (HCC)   . Arthritis   . Atrial fibrillation ([place])   . Baker's cyst of knee    right   . Bradycardia   . Cancer ([place])    skin cancer; endometrial cancer   . Colonic pseudoobstruction   . Dizziness   . Dysrhythmia   . Edema of lower extremity    bilat   . GERD (gastroesophageal reflux disease)   . Hard of hearing   . Heart murmur   . History of atrial flutter   . History of bronchitis   . History of kidney stones   . Hypercholesteremia   . Hypertension   . Macular degeneration   . Multiple falls   . Pancreatitis   . Scoliosis   . Shortness of breath dyspnea    can not climb full set of steps without stopping   . Toxic metabolic encephalopathy     Past Surgical History:  Procedure Laterality Date  . CARDIOVERSION    . DILATION AND  CURETTAGE OF UTERUS    . FOOT SURGERY     bone surgery  . KNEE ARTHROSCOPY Right   . TONSILLECTOMY    . TOTAL KNEE ARTHROPLASTY Right 12/01/2015   Procedure: RIGHT TOTAL KNEE ARTHROPLASTY;  Surgeon: Paralee Cancel, MD;  Location: [facility];  Service: Orthopedics;  Laterality: Right;  Marland Kitchen VAGINAL HYSTERECTOMY      Current Medications: Current Meds  Medication Sig  . amiodarone (PACERONE) 200 MG tablet Take 1 tablet (200 mg total) by mouth daily. Please keep upcoming appt in February 2022 with Dr. Burt Knack before anymore refills. Thank you  . apixaban (ELIQUIS) 5 MG TABS tablet Take 1 tablet (5 mg total) by mouth 2 (two) times daily.  . cloNIDine (CATAPRES) 0.1 MG tablet Take 0.1 mg by mouth as needed.  . furosemide (LASIX) 20 MG tablet Take 1 tablet (20 mg total) by mouth daily.  . irbesartan (AVAPRO) 150 MG tablet Take 0.5 tablets (75 mg total) by mouth every evening.  Marland Kitchen KLOR-CON 10 10 MEQ tablet TAKE 1 TABLET EVERY OTHER DAY  . levothyroxine (SYNTHROID) 25 MCG tablet Take 25 mcg by mouth daily before breakfast. Currently taking 37.5 mcg. Per patient's husband, "pt will be taking Levothyroxine 50 mcg next week."  . levothyroxine (SYNTHROID) 50 MCG tablet Take 50 mcg by mouth daily.  Marland Kitchen  metoprolol tartrate (LOPRESSOR) 25 MG tablet TAKE 1 TABLET BY MOUTH TWICE A DAY  . Multiple Vitamin (MULTIVITAMIN) capsule Take 1 capsule by mouth daily.  . Multiple Vitamins-Minerals (EYE VITAMINS PO) Take 2 tablets by mouth daily.   . pantoprazole (PROTONIX) 40 MG tablet TAKE 1 TABLET BY MOUTH EVERY DAY  . potassium chloride (KLOR-CON M10) 10 MEQ tablet Take 1 tablet (10 mEq total) by mouth every other day. Pt needs to keep upcoming appt in Feb for further refills  . simvastatin (ZOCOR) 20 MG tablet TAKE 1 TABLET BY MOUTH EVERY DAY  . VITAMIN E PO Take 1 capsule by mouth every other day.     Allergies:   Evista [raloxifene], Morphine, Statins, Erythromycin, and Gentamicin   Social History   Socioeconomic  History  . Marital status: Married    Spouse name: Not on file  . Number of children: Not on file  . Years of education: Not on file  . Highest education level: Not on file  Occupational History  . Occupation: Retired  Tobacco Use  . Smoking status: Never Smoker  . Smokeless tobacco: Never Used  Substance and Sexual Activity  . Alcohol use: Yes    Alcohol/week: 0.0 standard drinks    Comment: 3 glasses of wine monthly  . Drug use: No  . Sexual activity: Not on file  Other Topics Concern  . Not on file  Social History Narrative  . Not on file   Social Determinants of Health   Financial Resource Strain: Not on file  Food Insecurity: Not on file  Transportation Needs: Not on file  Physical Activity: Not on file  Stress: Not on file  Social Connections: Not on file     Family History: [name] family history includes Lymphoma in her father; Macular degeneration in her father and mother.  ROS:   Please see the history of present illness.    All other systems reviewed and are negative.  EKGs/Labs/Other Studies Reviewed:    The following studies were reviewed today: Labs reviewed from 11/26/2020: Hemoglobin 13.1, platelet count 213,000, hemoglobin A1c 5.6, cholesterol 185, HDL 61, LDL 97, triglycerides 135, creatinine 1.1, glucose 89, AST 33, ALT 32, free T4 1.15, TSH 3.27.  Echo 01/04/2019: IMPRESSIONS    1. The left ventricle has normal systolic function with an ejection  fraction of 60-65%. The cavity size was normal. There is moderately  increased left ventricular wall thickness. Left ventricular diastolic  parameters were normal.  2. The right ventricle has normal systolic function. The cavity was  normal. There is no increase in right ventricular wall thickness.  3. Left atrial size was severely dilated.  4. The mitral valve is normal in structure. There is mild mitral annular  calcification present.  5. The tricuspid valve is normal in structure.  6.  The aortic valve is tricuspid Mild thickening of the aortic valve Mild  calcification of the aortic valve.  7. The pulmonic valve was grossly normal. Pulmonic valve regurgitation is  mild by color flow Doppler.   Event monitor 07/16/2019: Study Highlights  1.  Basic rhythm is normal sinus with an average heart rate of 61 bpm 2.  There are short supraventricular runs up to 11 beats, but no long runs or sustained episodes of SVT 3.  Occasional PVCs 4.  There are no marked bradycardia arrhythmias or pathologic pauses greater than 3 seconds 5.  There is no evidence of atrial fibrillation or flutter  EKG:  EKG is ordered  today.  The ekg ordered today demonstrates sinus bradycardia 52 bpm, nonspecific ST abnormality, otherwise within normal limits.   Recent Labs: No results found for requested labs within last 8760 hours.  Recent Lipid Panel    Component Value Date/Time   CHOL 170 06/17/2019 1053   TRIG 74 06/17/2019 1053   HDL 64 06/17/2019 1053   CHOLHDL 2.7 06/17/2019 1053   LDLCALC 91 06/17/2019 1053     Risk Assessment/Calculations:    CHA2DS2-VASc Score = 4  This indicates a 4.8% annual risk of stroke. [name] score is based upon: CHF History: No HTN History: Yes Diabetes History: No Stroke History: No Vascular Disease History: No Age Score: 2 Gender Score: 1      Physical Exam:    VS:  BP 120/70   Pulse (!) 52   Ht 5' (1.524 m)   Wt 149 lb 6.4 oz (67.8 kg)   SpO2 96%   BMI 29.18 kg/m     Wt Readings from Last 3 Encounters:  12/28/20 149 lb 6.4 oz (67.8 kg)  12/06/19 152 lb 12.8 oz (69.3 kg)  06/17/19 152 lb 3.2 oz (69 kg)     GEN:  Well nourished, well developed pleasant elderly woman in no acute distress HEENT: Normal, hard of hearing NECK: No JVD; No carotid bruits LYMPHATICS: No lymphadenopathy CARDIAC: RRR, 2/6 systolic murmur at the RUSB RESPIRATORY:  Clear to auscultation without rales, wheezing or rhonchi  ABDOMEN: Soft, non-tender,  non-distended MUSCULOSKELETAL:  Trace BL ankle edema; No deformity  SKIN: Warm and dry, stasis changes NEUROLOGIC:  Alert and oriented x 3 PSYCHIATRIC:  Normal affect   ASSESSMENT:    1. Palpitations   2. PAF (paroxysmal atrial fibrillation) ([place])   3. Essential hypertension   4. Hypercholesterolemia    PLAN:    In order of problems listed above:  1. Continues to have episodic palpitations but overall stable pattern.  Continue amiodarone.  We discussed amiodarone monitoring.  Her thyroid and liver function testing is up-to-date.  She sees a retinal specialist twice per year.  She has not had a chest x-ray since 2019 and I recommended obtaining a PA and lateral chest x-ray today.  She remains on apixaban for oral anticoagulation.  She asked about dose reduction, but I favor keeping her on 5 mg twice daily as she does not meet criteria for dose reduction. 2. See above EKG reviewed and in normal sinus rhythm today 3. Blood pressure well controlled on current program which includes irbesartan and metoprolol. 4. Treated with simvastatin 20 mg daily.  Lipids and LFTs reviewed as outlined above.   Medication Adjustments/Labs and Tests Ordered: Current medicines are reviewed at length with the patient today.  Concerns regarding medicines are outlined above.  No orders of the defined types were placed in this encounter.  No orders of the defined types were placed in this encounter.   There are no Patient Instructions on file for this visit.   Signed, Sherren Mocha, MD  12/28/2020 1:17 PM    [facility]

## 2020-12-28 NOTE — Patient Instructions (Signed)
Medication Instructions:  Your provider recommends that you continue on your current medications as directed. Please refer to the Current Medication list given to you today.   *If you need a refill on your cardiac medications before your next appointment, please call your pharmacy*  Testing/Procedures: Your provider recommends you have a CHEST X-RAY. Please proceed to [place] at [facility]: Address: [address] Phone: [phone] You do not need an appointment. Business hours are M-F 8:00AM to 5:00PM. There are no restrictions for this test.   Follow-Up: At [facility], you and your health needs are our priority.  As part of our continuing mission to provide you with exceptional heart care, we have created designated Provider Care Teams.  These Care Teams include your primary Cardiologist (physician) and Advanced Practice Providers (APPs -  Physician Assistants and Nurse Practitioners) who all work together to provide you with the care you need, when you need it. Your next appointment:   12 month(s) The format for your next appointment:   In Person Provider:   You may see Sherren Mocha, MD or one of the following Advanced Practice Providers on your designated Care Team:    Richardson Dopp, PA-C  [place]in [place], [place]

## 2021-01-12 ENCOUNTER — Other Ambulatory Visit: Payer: Self-pay | Admitting: Cardiovascular Disease

## 2021-01-27 DIAGNOSIS — E039 Hypothyroidism, unspecified: Secondary | ICD-10-CM | POA: Diagnosis not present

## 2021-01-27 DIAGNOSIS — E785 Hyperlipidemia, unspecified: Secondary | ICD-10-CM | POA: Diagnosis not present

## 2021-01-27 DIAGNOSIS — I10 Essential (primary) hypertension: Secondary | ICD-10-CM | POA: Diagnosis not present

## 2021-01-28 DIAGNOSIS — L578 Other skin changes due to chronic exposure to nonionizing radiation: Secondary | ICD-10-CM | POA: Diagnosis not present

## 2021-01-28 DIAGNOSIS — C44622 Squamous cell carcinoma of skin of right upper limb, including shoulder: Secondary | ICD-10-CM | POA: Diagnosis not present

## 2021-01-28 DIAGNOSIS — Z8589 Personal history of malignant neoplasm of other organs and systems: Secondary | ICD-10-CM | POA: Diagnosis not present

## 2021-01-28 DIAGNOSIS — L570 Actinic keratosis: Secondary | ICD-10-CM | POA: Diagnosis not present

## 2021-01-28 DIAGNOSIS — D225 Melanocytic nevi of trunk: Secondary | ICD-10-CM | POA: Diagnosis not present

## 2021-01-28 DIAGNOSIS — L821 Other seborrheic keratosis: Secondary | ICD-10-CM | POA: Diagnosis not present

## 2021-02-01 DIAGNOSIS — M19012 Primary osteoarthritis, left shoulder: Secondary | ICD-10-CM | POA: Diagnosis not present

## 2021-02-01 DIAGNOSIS — M19042 Primary osteoarthritis, left hand: Secondary | ICD-10-CM | POA: Diagnosis not present

## 2021-02-01 DIAGNOSIS — M25512 Pain in left shoulder: Secondary | ICD-10-CM | POA: Diagnosis not present

## 2021-02-01 DIAGNOSIS — S42292A Other displaced fracture of upper end of left humerus, initial encounter for closed fracture: Secondary | ICD-10-CM | POA: Diagnosis not present

## 2021-02-01 DIAGNOSIS — S42212A Unspecified displaced fracture of surgical neck of left humerus, initial encounter for closed fracture: Secondary | ICD-10-CM | POA: Diagnosis not present

## 2021-02-03 DIAGNOSIS — S42292A Other displaced fracture of upper end of left humerus, initial encounter for closed fracture: Secondary | ICD-10-CM | POA: Diagnosis not present

## 2021-02-08 ENCOUNTER — Telehealth: Payer: Self-pay

## 2021-02-08 NOTE — Telephone Encounter (Signed)
Traci, PA from [facility] reports the patient fell and broke her shoulder. They will conduct a shoulder replacement on Thursday.  They do not request clearance, but rather ask what the typical "hold" for Eliquis is for ortho procedures.  Informed her that typically Eliquis is held for 2 days prior to procedure and resumed as soon as possible afterward. She was grateful for call and agrees with plan.   Christie [place], [place] Cell: [phone]

## 2021-02-09 ENCOUNTER — Encounter (HOSPITAL_COMMUNITY): Payer: Self-pay

## 2021-02-09 ENCOUNTER — Encounter (HOSPITAL_COMMUNITY)
Admission: RE | Admit: 2021-02-09 | Discharge: 2021-02-09 | Disposition: A | Discharge status: Home / Self Care | Payer: Medicare Other | Source: Ambulatory Visit | Attending: Orthopedic Surgery | Admitting: Orthopedic Surgery

## 2021-02-09 ENCOUNTER — Other Ambulatory Visit: Payer: Self-pay

## 2021-02-09 DIAGNOSIS — Z01812 Encounter for preprocedural laboratory examination: Secondary | ICD-10-CM | POA: Insufficient documentation

## 2021-02-09 LAB — SURGICAL PCR SCREEN
MRSA, PCR: NEGATIVE
Staphylococcus aureus: NEGATIVE

## 2021-02-09 LAB — CBC
HCT: 33.7 % — ABNORMAL LOW (ref 36.0–46.0)
Hemoglobin: 10.5 g/dL — ABNORMAL LOW (ref 12.0–15.0)
MCH: 28.8 pg (ref 26.0–34.0)
MCHC: 31.2 g/dL (ref 30.0–36.0)
MCV: 92.6 fL (ref 80.0–100.0)
Platelets: 356 10*3/uL (ref 150–400)
RBC: 3.64 MIL/uL — ABNORMAL LOW (ref 3.87–5.11)
RDW: 16 % — ABNORMAL HIGH (ref 11.5–15.5)
WBC: 11 10*3/uL — ABNORMAL HIGH (ref 4.0–10.5)
nRBC: 0 % (ref 0.0–0.2)

## 2021-02-09 LAB — BASIC METABOLIC PANEL
Anion gap: 8 (ref 5–15)
BUN: 27 mg/dL — ABNORMAL HIGH (ref 8–23)
CO2: 27 mmol/L (ref 22–32)
Calcium: 9.6 mg/dL (ref 8.9–10.3)
Chloride: 106 mmol/L (ref 98–111)
Creatinine, Ser: 1.08 mg/dL — ABNORMAL HIGH (ref 0.44–1.00)
GFR, Estimated: 50 mL/min — ABNORMAL LOW (ref >60)
Glucose, Bld: 105 mg/dL — ABNORMAL HIGH (ref 70–99)
Potassium: 5.2 mmol/L — ABNORMAL HIGH (ref 3.5–5.1)
Sodium: 141 mmol/L (ref 135–145)

## 2021-02-09 NOTE — Telephone Encounter (Signed)
Pt scheduled for shoulder arthroplasty on 4/15. She takes Eliquis for afib with CHADS2VASc score of 4 (age x2, sex, HTN), pt can hold Eliquis for 2-3 days prior to procedure. Pt is aware.

## 2021-02-09 NOTE — Progress Notes (Addendum)
DUE TO COVID-19 ONLY ONE VISITOR IS ALLOWED TO COME WITH YOU AND STAY IN THE WAITING ROOM ONLY DURING PRE OP AND PROCEDURE DAY OF SURGERY. THE 1 VISITOR  MAY VISIT WITH YOU AFTER SURGERY IN YOUR PRIVATE ROOM DURING VISITING HOURS ONLY!  YOU NEED TO HAVE A COVID 19 TEST ON__4/09/2021 _____ @_______ , THIS TEST MUST BE DONE BEFORE SURGERY,  COVID TESTING SITE [address] [place] [address], IT IS ON THE RIGHT GOING OUT [address] APPROXIMATELY  2 MINUTES PAST [employer] ON THE RIGHT. ONCE YOUR COVID TEST IS COMPLETED,  PLEASE BEGIN THE QUARANTINE INSTRUCTIONS AS OUTLINED IN YOUR HANDOUT.                Elizabeth Everett  02/09/2021   Your procedure is scheduled on:              02/12/2021   Report to [facility] Main  Entrance   Report to admitting at     1230 pm     Call this number if you have problems the morning of surgery [phone]    Remember: Do not eat food , candy gum or mints :After Midnight. You may have clear liquids from midnight until 1200 noon  Complete Ensure preop drink by 12 noon day before surgery.     CLEAR LIQUID DIET   Foods Allowed                                                                       Coffee and tea, regular and decaf                              Plain Jell-O any favor except red or purple                                            Fruit ices (not with fruit pulp)                                      Iced Popsicles                                     Carbonated beverages, regular and diet                                    Cranberry, grape and apple juices Sports drinks like Gatorade Lightly seasoned clear broth or consume(fat free) Sugar, honey syrup   _____________________________________________________________________    BRUSH YOUR TEETH MORNING OF SURGERY AND RINSE YOUR MOUTH OUT, NO CHEWING GUM CANDY OR MINTS.     Take these medicines the morning of surgery with A SIP OF WATER:     Synthroid,  amiodarone, protonix , metoprolol  DO NOT TAKE ANY DIABETIC MEDICATIONS DAY OF YOUR SURGERY  You may not have any metal on your body including hair pins and              piercings  Do not wear jewelry, make-up, lotions, powders or perfumes, deodorant             Do not wear nail polish on your fingernails.  Do not shave  48 hours prior to surgery.              Men may shave face and neck.   Do not bring valuables to the hospital. [place].  Contacts, dentures or bridgework may not be worn into surgery.  Leave suitcase in the car. After surgery it may be brought to your room.     Patients discharged the day of surgery will not be allowed to drive home. IF YOU ARE HAVING SURGERY AND GOING HOME THE SAME DAY, YOU MUST HAVE AN ADULT TO DRIVE YOU HOME AND BE WITH YOU FOR 24 HOURS. YOU MAY GO HOME BY TAXI OR UBER OR ORTHERWISE, BUT AN ADULT MUST ACCOMPANY YOU HOME AND STAY WITH YOU FOR 24 HOURS.  Name and phone number of your driver:  Special Instructions: N/A              Please read over the following fact sheets you were given: _____________________________________________________________________  [facility] - Preparing for Surgery Before surgery, you can play an important role.  Because skin is not sterile, your skin needs to be as free of germs as possible.  You can reduce the number of germs on your skin by washing with CHG (chlorahexidine gluconate) soap before surgery.  CHG is an antiseptic cleaner which kills germs and bonds with the skin to continue killing germs even after washing. Please DO NOT use if you have an allergy to CHG or antibacterial soaps.  If your skin becomes reddened/irritated stop using the CHG and inform your nurse when you arrive at Short Stay. Do not shave (including legs and underarms) for at least 48 hours prior to the first CHG shower.  You may shave your face/neck. Please follow these  instructions carefully:  1.  Shower with CHG Soap the night before surgery and the  morning of Surgery.  2.  If you choose to wash your hair, wash your hair first as usual with your  normal  shampoo.  3.  After you shampoo, rinse your hair and body thoroughly to remove the  shampoo.                           4.  Use CHG as you would any other liquid soap.  You can apply chg directly  to the skin and wash                       Gently with a scrungie or clean washcloth.  5.  Apply the CHG Soap to your body ONLY FROM THE NECK DOWN.   Do not use on face/ open                           Wound or open sores. Avoid contact with eyes, ears mouth and genitals (private parts).  Wash face,  Genitals (private parts) with your normal soap.             6.  Wash thoroughly, paying special attention to the area where your surgery  will be performed.  7.  Thoroughly rinse your body with warm water from the neck down.  8.  DO NOT shower/wash with your normal soap after using and rinsing off  the CHG Soap.                9.  Pat yourself dry with a clean towel.            10.  Wear clean pajamas.            11.  Place clean sheets on your bed the night of your first shower and do not  sleep with pets. Day of Surgery : Do not apply any lotions/deodorants the morning of surgery.  Please wear clean clothes to the hospital/surgery center.  FAILURE TO FOLLOW THESE INSTRUCTIONS MAY RESULT IN THE CANCELLATION OF YOUR SURGERY PATIENT SIGNATURE_________________________________  NURSE SIGNATURE__________________________________  ________________________________________________________________________      [facility]- Preparing for Total Shoulder Arthroplasty    Before surgery, you can play an important role. Because skin is not sterile, your skin needs to be as free of germs as possible. You can reduce the number of germs on your skin by using the following products. . Benzoyl Peroxide  Gel o Reduces the number of germs present on the skin o Applied twice a day to shoulder area starting two days before surgery    ==================================================================  Please follow these instructions carefully:  BENZOYL PEROXIDE 5% GEL  Please do not use if you have an allergy to benzoyl peroxide.   If your skin becomes reddened/irritated stop using the benzoyl peroxide.  Starting two days before surgery, apply as follows: 1. Apply benzoyl peroxide in the morning and at night. Apply after taking a shower. If you are not taking a shower clean entire shoulder front, back, and side along with the armpit with a clean wet washcloth.  2. Place a quarter-sized dollop on your shoulder and rub in thoroughly, making sure to cover the front, back, and side of your shoulder, along with the armpit.   2 days before ____ AM   ____ PM              1 day before ____ AM   ____ PM                         3. Do this twice a day for two days.  (Last application is the night before surgery, AFTER using the CHG soap as described below).  4. Do NOT apply benzoyl peroxide gel on the day of surgery.

## 2021-02-09 NOTE — Progress Notes (Addendum)
Anesthesia Review:  PCP: DR Christa See - Requested [place] note [phone]. Office visit notes recei ved on 02/10/21 and placed on chart.   Cardiologist : DR Sherren Mocha  LOV 12/28/20  Chest x-ray : 12/29/20 EKG :12/28/20 Echo : 2020  Stress test: Cardiac Cath :  Activity level: cannot do a flight of stairs without difficulty  Sleep Study/ CPAP :no Fasting Blood Sugar :      / Checks Blood Sugar -- times a day:   Blood Thinner/ Instructions /Last Dose: ASA / Instructions/ Last Dose : \ Eliquis- last dose 02/09/21  Per  pt and husband  At preop appt today pt was in wheelchair accompanied by husband.  Pt's left arm was noteably bruised and in sling.   Pt and husband report 02/01/21- pt stumbled down on left leg while trying to get into an SUV and left leg has abrasion at left calf . PT was seen in ED on 02/01/21 at [facility].   On 02/02/21 pt and husband report a fall and on 02/03/21( pt reports dizziness with this fall on 02/03/21)  pt suffered another fall.  Pt was not seen in ED nor seen by PCP after the 02/02/21 and 02/03/21 falls.  Pt does report a small abrasion on left forehead and and bump on top of head.  Noted top of head to have an abrasion.  Asked pt if any CT scans or xrays of head had been done.  Pt and husband both report none.  Husband reports pt was on oxycodone on 02/02/21 and 02/03/21 .   Blood pressure at preop in right arm as follows: 135/48 and 145/41.  Made Roanna Banning aware of all of above.   REchecked blood pressure in right arm after blood drawn.  148/49.  Roanna Banning made aware.  Pt discharged to home per Konrad Felix,   CBC and BMP  done 02/09/21 routed to DR Supple.

## 2021-02-10 ENCOUNTER — Other Ambulatory Visit (HOSPITAL_COMMUNITY)
Admission: RE | Admit: 2021-02-10 | Discharge: 2021-02-10 | Disposition: A | Discharge status: Home / Self Care | Payer: Medicare Other | Source: Ambulatory Visit | Attending: Orthopedic Surgery | Admitting: Orthopedic Surgery

## 2021-02-10 ENCOUNTER — Other Ambulatory Visit: Payer: Self-pay | Admitting: Cardiovascular Disease

## 2021-02-10 DIAGNOSIS — Z20822 Contact with and (suspected) exposure to covid-19: Secondary | ICD-10-CM | POA: Diagnosis not present

## 2021-02-10 DIAGNOSIS — Z01812 Encounter for preprocedural laboratory examination: Secondary | ICD-10-CM | POA: Insufficient documentation

## 2021-02-10 LAB — SARS CORONAVIRUS 2 (TAT 6-24 HRS): SARS Coronavirus 2: NEGATIVE

## 2021-02-11 ENCOUNTER — Other Ambulatory Visit: Payer: Self-pay | Admitting: Cardiovascular Disease

## 2021-02-11 NOTE — Telephone Encounter (Signed)
Prescription refill request for Eliquis received. Indication: PAF Last office visit: 12/28/20 Scr: 1.08 on 02/09/21 Age: 87 Weight: 67.8kg  Based on above findings Eliquis 5mg  twice daily is the appropriate dose.  Refill approved.

## 2021-02-12 ENCOUNTER — Ambulatory Visit (HOSPITAL_COMMUNITY): Payer: Medicare Other | Admitting: Physician Assistant

## 2021-02-12 ENCOUNTER — Other Ambulatory Visit: Payer: Self-pay

## 2021-02-12 ENCOUNTER — Encounter (HOSPITAL_COMMUNITY): Payer: Self-pay | Admitting: Orthopedic Surgery

## 2021-02-12 ENCOUNTER — Ambulatory Visit (HOSPITAL_COMMUNITY)
Admission: RE | Admit: 2021-02-12 | Discharge: 2021-02-13 | Disposition: A | Discharge status: Home / Self Care | Payer: Medicare Other | Attending: Orthopedic Surgery | Admitting: Orthopedic Surgery

## 2021-02-12 ENCOUNTER — Encounter (HOSPITAL_COMMUNITY)
Admission: RE | Disposition: A | Discharge status: Home / Self Care | Payer: Self-pay | Source: Home / Self Care | Attending: Orthopedic Surgery

## 2021-02-12 ENCOUNTER — Ambulatory Visit (HOSPITAL_COMMUNITY): Payer: Medicare Other | Admitting: Certified Registered Nurse Anesthetist

## 2021-02-12 DIAGNOSIS — Z807 Family history of other malignant neoplasms of lymphoid, hematopoietic and related tissues: Secondary | ICD-10-CM | POA: Insufficient documentation

## 2021-02-12 DIAGNOSIS — S42202A Unspecified fracture of upper end of left humerus, initial encounter for closed fracture: Secondary | ICD-10-CM | POA: Diagnosis not present

## 2021-02-12 DIAGNOSIS — R2681 Unsteadiness on feet: Secondary | ICD-10-CM | POA: Insufficient documentation

## 2021-02-12 DIAGNOSIS — Z79890 Hormone replacement therapy (postmenopausal): Secondary | ICD-10-CM | POA: Diagnosis not present

## 2021-02-12 DIAGNOSIS — Z90710 Acquired absence of both cervix and uterus: Secondary | ICD-10-CM | POA: Diagnosis not present

## 2021-02-12 DIAGNOSIS — Z888 Allergy status to other drugs, medicaments and biological substances status: Secondary | ICD-10-CM | POA: Diagnosis not present

## 2021-02-12 DIAGNOSIS — Z79899 Other long term (current) drug therapy: Secondary | ICD-10-CM | POA: Diagnosis not present

## 2021-02-12 DIAGNOSIS — W1830XA Fall on same level, unspecified, initial encounter: Secondary | ICD-10-CM | POA: Insufficient documentation

## 2021-02-12 DIAGNOSIS — G8918 Other acute postprocedural pain: Secondary | ICD-10-CM | POA: Diagnosis not present

## 2021-02-12 DIAGNOSIS — Z96651 Presence of right artificial knee joint: Secondary | ICD-10-CM | POA: Diagnosis not present

## 2021-02-12 DIAGNOSIS — E7800 Pure hypercholesterolemia, unspecified: Secondary | ICD-10-CM | POA: Insufficient documentation

## 2021-02-12 DIAGNOSIS — Z885 Allergy status to narcotic agent status: Secondary | ICD-10-CM | POA: Diagnosis not present

## 2021-02-12 DIAGNOSIS — S42292A Other displaced fracture of upper end of left humerus, initial encounter for closed fracture: Secondary | ICD-10-CM | POA: Diagnosis not present

## 2021-02-12 DIAGNOSIS — I10 Essential (primary) hypertension: Secondary | ICD-10-CM | POA: Diagnosis not present

## 2021-02-12 DIAGNOSIS — Z8542 Personal history of malignant neoplasm of other parts of uterus: Secondary | ICD-10-CM | POA: Insufficient documentation

## 2021-02-12 DIAGNOSIS — Z85828 Personal history of other malignant neoplasm of skin: Secondary | ICD-10-CM | POA: Insufficient documentation

## 2021-02-12 DIAGNOSIS — Z96612 Presence of left artificial shoulder joint: Secondary | ICD-10-CM

## 2021-02-12 DIAGNOSIS — Z7901 Long term (current) use of anticoagulants: Secondary | ICD-10-CM | POA: Diagnosis not present

## 2021-02-12 DIAGNOSIS — I4891 Unspecified atrial fibrillation: Secondary | ICD-10-CM | POA: Insufficient documentation

## 2021-02-12 DIAGNOSIS — Z881 Allergy status to other antibiotic agents status: Secondary | ICD-10-CM | POA: Insufficient documentation

## 2021-02-12 HISTORY — PX: REVERSE SHOULDER ARTHROPLASTY: SHX5054

## 2021-02-12 SURGERY — ARTHROPLASTY, SHOULDER, TOTAL, REVERSE
Anesthesia: General | Site: Shoulder | Laterality: Left

## 2021-02-12 MED ORDER — LACTATED RINGERS IV SOLN: INTRAVENOUS | Status: DC

## 2021-02-12 MED ORDER — PHENYLEPHRINE HCL-NACL 10-0.9 MG/250ML-% IV SOLN
INTRAVENOUS | Status: DC | PRN
  Administered 2021-02-12: 80 ug/min via INTRAVENOUS

## 2021-02-12 MED ORDER — METOCLOPRAMIDE HCL 5 MG PO TABS: 5.0000 mg | ORAL_TABLET | Freq: Three times a day (TID) | ORAL | Status: DC | PRN

## 2021-02-12 MED ORDER — METHOCARBAMOL 500 MG PO TABS: 500.0000 mg | ORAL_TABLET | Freq: Four times a day (QID) | ORAL | Status: DC | PRN

## 2021-02-12 MED ORDER — AMISULPRIDE (ANTIEMETIC) 5 MG/2ML IV SOLN: 10.0000 mg | Freq: Once | INTRAVENOUS | Status: AC

## 2021-02-12 MED ORDER — ONDANSETRON HCL 4 MG/2ML IJ SOLN: 4.0000 mg | Freq: Once | INTRAMUSCULAR | Status: AC | PRN

## 2021-02-12 MED ORDER — OXYCODONE HCL 5 MG/5ML PO SOLN: 5.0000 mg | Freq: Once | ORAL | Status: DC | PRN

## 2021-02-12 MED ORDER — DEXAMETHASONE SODIUM PHOSPHATE 10 MG/ML IJ SOLN
INTRAMUSCULAR | Status: DC | PRN
  Administered 2021-02-12: 4 mg via INTRAVENOUS

## 2021-02-12 MED ORDER — PHENOL 1.4 % MT LIQD: 1.0000 | OROMUCOSAL | Status: DC | PRN

## 2021-02-12 MED ORDER — KETOROLAC TROMETHAMINE 15 MG/ML IJ SOLN: 7.5000 mg | Freq: Three times a day (TID) | INTRAMUSCULAR | Status: DC

## 2021-02-12 MED ORDER — ONDANSETRON HCL 4 MG/2ML IJ SOLN
INTRAMUSCULAR | Status: DC | PRN
  Administered 2021-02-12: 4 mg via INTRAVENOUS

## 2021-02-12 MED ORDER — AMIODARONE HCL 200 MG PO TABS
200.0000 mg | ORAL_TABLET | Freq: Every day | ORAL | Status: DC
  Administered 2021-02-13: 200 mg via ORAL
  Filled 2021-02-12: qty 1

## 2021-02-12 MED ORDER — LIDOCAINE 2% (20 MG/ML) 5 ML SYRINGE
INTRAMUSCULAR | Status: DC | PRN
  Administered 2021-02-12: 40 mg via INTRAVENOUS

## 2021-02-12 MED ORDER — ONDANSETRON HCL 4 MG/2ML IJ SOLN: 4.0000 mg | Freq: Four times a day (QID) | INTRAMUSCULAR | Status: DC | PRN

## 2021-02-12 MED ORDER — CHLORHEXIDINE GLUCONATE 0.12 % MT SOLN
15.0000 mL | Freq: Once | OROMUCOSAL | Status: AC
  Administered 2021-02-12: 15 mL via OROMUCOSAL

## 2021-02-12 MED ORDER — AMISULPRIDE (ANTIEMETIC) 5 MG/2ML IV SOLN
INTRAVENOUS | Status: AC
  Administered 2021-02-12: 10 mg via INTRAVENOUS
  Filled 2021-02-12: qty 2

## 2021-02-12 MED ORDER — ONDANSETRON HCL 4 MG/2ML IJ SOLN
INTRAMUSCULAR | Status: AC
  Filled 2021-02-12: qty 2

## 2021-02-12 MED ORDER — METOPROLOL TARTRATE 25 MG PO TABS
25.0000 mg | ORAL_TABLET | Freq: Two times a day (BID) | ORAL | Status: DC
  Administered 2021-02-12 – 2021-02-13 (×2): 25 mg via ORAL
  Filled 2021-02-12 (×2): qty 1

## 2021-02-12 MED ORDER — MENTHOL 3 MG MT LOZG: 1.0000 | LOZENGE | OROMUCOSAL | Status: DC | PRN

## 2021-02-12 MED ORDER — OXYCODONE HCL 5 MG PO TABS
5.0000 mg | ORAL_TABLET | Freq: Once | ORAL | Status: DC | PRN
Start: 2021-02-12 — End: 2021-02-12

## 2021-02-12 MED ORDER — VANCOMYCIN HCL 1000 MG IV SOLR
INTRAVENOUS | Status: DC | PRN
  Administered 2021-02-12: 1000 mg via TOPICAL

## 2021-02-12 MED ORDER — METHOCARBAMOL 1000 MG/10ML IJ SOLN
500.0000 mg | Freq: Four times a day (QID) | INTRAVENOUS | Status: DC | PRN
  Filled 2021-02-12: qty 5

## 2021-02-12 MED ORDER — TRAMADOL HCL 50 MG PO TABS
50.0000 mg | ORAL_TABLET | Freq: Four times a day (QID) | ORAL | Status: DC | PRN
  Administered 2021-02-12: 50 mg via ORAL
  Filled 2021-02-12: qty 1

## 2021-02-12 MED ORDER — FENTANYL CITRATE (PF) 100 MCG/2ML IJ SOLN
50.0000 ug | Freq: Once | INTRAMUSCULAR | Status: AC
  Administered 2021-02-12: 50 ug via INTRAVENOUS
  Filled 2021-02-12: qty 2

## 2021-02-12 MED ORDER — TRAMADOL HCL 50 MG PO TABS: 50.0000 mg | ORAL_TABLET | Freq: Four times a day (QID) | ORAL | Status: DC | PRN

## 2021-02-12 MED ORDER — SODIUM CHLORIDE 0.9 % IR SOLN
Status: DC | PRN
  Administered 2021-02-12: 1000 mL

## 2021-02-12 MED ORDER — EPHEDRINE SULFATE-NACL 50-0.9 MG/10ML-% IV SOSY
PREFILLED_SYRINGE | INTRAVENOUS | Status: DC | PRN
  Administered 2021-02-12: 10 mg via INTRAVENOUS

## 2021-02-12 MED ORDER — OXYCODONE HCL 5 MG PO TABS
5.0000 mg | ORAL_TABLET | ORAL | Status: DC | PRN
Start: 2021-02-12 — End: 2021-02-13

## 2021-02-12 MED ORDER — ACETAMINOPHEN 325 MG PO TABS
325.0000 mg | ORAL_TABLET | Freq: Four times a day (QID) | ORAL | Status: DC | PRN
  Administered 2021-02-13: 650 mg via ORAL
  Filled 2021-02-12: qty 2

## 2021-02-12 MED ORDER — ORAL CARE MOUTH RINSE: 15.0000 mL | Freq: Once | OROMUCOSAL | Status: AC

## 2021-02-12 MED ORDER — DIPHENHYDRAMINE HCL 12.5 MG/5ML PO ELIX: 12.5000 mg | ORAL_SOLUTION | ORAL | Status: DC | PRN

## 2021-02-12 MED ORDER — BISACODYL 5 MG PO TBEC: 5.0000 mg | DELAYED_RELEASE_TABLET | Freq: Every day | ORAL | Status: DC | PRN

## 2021-02-12 MED ORDER — FENTANYL CITRATE (PF) 100 MCG/2ML IJ SOLN
INTRAMUSCULAR | Status: AC
  Filled 2021-02-12: qty 2

## 2021-02-12 MED ORDER — TRANEXAMIC ACID-NACL 1000-0.7 MG/100ML-% IV SOLN
1000.0000 mg | INTRAVENOUS | Status: AC
Start: 2021-02-12 — End: 2021-02-12
  Administered 2021-02-12: 1000 mg via INTRAVENOUS
  Filled 2021-02-12: qty 100

## 2021-02-12 MED ORDER — APIXABAN 5 MG PO TABS
5.0000 mg | ORAL_TABLET | Freq: Two times a day (BID) | ORAL | Status: DC
  Administered 2021-02-13: 5 mg via ORAL
  Filled 2021-02-12: qty 1

## 2021-02-12 MED ORDER — FUROSEMIDE 20 MG PO TABS
20.0000 mg | ORAL_TABLET | Freq: Every day | ORAL | Status: DC
  Administered 2021-02-13: 20 mg via ORAL
  Filled 2021-02-12: qty 1

## 2021-02-12 MED ORDER — PANTOPRAZOLE SODIUM 40 MG PO TBEC
40.0000 mg | DELAYED_RELEASE_TABLET | Freq: Every day | ORAL | Status: DC
  Administered 2021-02-13: 40 mg via ORAL
  Filled 2021-02-12: qty 1

## 2021-02-12 MED ORDER — POLYETHYLENE GLYCOL 3350 17 G PO PACK: 17.0000 g | PACK | Freq: Every day | ORAL | Status: DC | PRN

## 2021-02-12 MED ORDER — OXYCODONE HCL 5 MG PO TABS: 10.0000 mg | ORAL_TABLET | ORAL | Status: DC | PRN

## 2021-02-12 MED ORDER — TRAMADOL HCL 50 MG PO TABS: 50.0000 mg | ORAL_TABLET | Freq: Four times a day (QID) | ORAL | 0 refills | Status: DC | PRN

## 2021-02-12 MED ORDER — ROCURONIUM BROMIDE 10 MG/ML (PF) SYRINGE
PREFILLED_SYRINGE | INTRAVENOUS | Status: AC
  Filled 2021-02-12: qty 10

## 2021-02-12 MED ORDER — PHENYLEPHRINE 40 MCG/ML (10ML) SYRINGE FOR IV PUSH (FOR BLOOD PRESSURE SUPPORT)
PREFILLED_SYRINGE | INTRAVENOUS | Status: DC | PRN
  Administered 2021-02-12: 80 ug via INTRAVENOUS

## 2021-02-12 MED ORDER — SUGAMMADEX SODIUM 200 MG/2ML IV SOLN
INTRAVENOUS | Status: DC | PRN
  Administered 2021-02-12: 150 mg via INTRAVENOUS

## 2021-02-12 MED ORDER — LEVOTHYROXINE SODIUM 50 MCG PO TABS
50.0000 ug | ORAL_TABLET | Freq: Every day | ORAL | Status: DC
  Administered 2021-02-13: 50 ug via ORAL
  Filled 2021-02-12: qty 1

## 2021-02-12 MED ORDER — ONDANSETRON HCL 4 MG/2ML IJ SOLN
INTRAMUSCULAR | Status: AC
  Administered 2021-02-12: 4 mg via INTRAVENOUS
  Filled 2021-02-12: qty 2

## 2021-02-12 MED ORDER — PROPOFOL 10 MG/ML IV BOLUS
INTRAVENOUS | Status: AC
  Filled 2021-02-12: qty 20

## 2021-02-12 MED ORDER — DEXAMETHASONE SODIUM PHOSPHATE 10 MG/ML IJ SOLN
INTRAMUSCULAR | Status: AC
  Filled 2021-02-12: qty 1

## 2021-02-12 MED ORDER — METOCLOPRAMIDE HCL 5 MG/ML IJ SOLN: 5.0000 mg | Freq: Three times a day (TID) | INTRAMUSCULAR | Status: DC | PRN

## 2021-02-12 MED ORDER — BUPIVACAINE LIPOSOME 1.3 % IJ SUSP
INTRAMUSCULAR | Status: DC | PRN
  Administered 2021-02-12: 10 mL

## 2021-02-12 MED ORDER — LIDOCAINE 2% (20 MG/ML) 5 ML SYRINGE
INTRAMUSCULAR | Status: AC
  Filled 2021-02-12: qty 5

## 2021-02-12 MED ORDER — STERILE WATER FOR IRRIGATION IR SOLN
Status: DC | PRN
  Administered 2021-02-12: 2000 mL

## 2021-02-12 MED ORDER — PHENYLEPHRINE HCL (PRESSORS) 10 MG/ML IV SOLN
INTRAVENOUS | Status: AC
  Filled 2021-02-12: qty 5

## 2021-02-12 MED ORDER — HYDROCODONE-ACETAMINOPHEN 5-325 MG PO TABS: 1.0000 | ORAL_TABLET | ORAL | 0 refills | Status: AC | PRN

## 2021-02-12 MED ORDER — ROCURONIUM BROMIDE 10 MG/ML (PF) SYRINGE
PREFILLED_SYRINGE | INTRAVENOUS | Status: DC | PRN
  Administered 2021-02-12: 60 mg via INTRAVENOUS

## 2021-02-12 MED ORDER — FENTANYL CITRATE (PF) 100 MCG/2ML IJ SOLN: 25.0000 ug | INTRAMUSCULAR | Status: DC | PRN

## 2021-02-12 MED ORDER — IRBESARTAN 75 MG PO TABS
75.0000 mg | ORAL_TABLET | Freq: Every day | ORAL | Status: DC
  Administered 2021-02-13: 75 mg via ORAL
  Filled 2021-02-12: qty 1

## 2021-02-12 MED ORDER — BUPIVACAINE HCL (PF) 0.5 % IJ SOLN
INTRAMUSCULAR | Status: DC | PRN
  Administered 2021-02-12: 15 mL via PERINEURAL

## 2021-02-12 MED ORDER — MAGNESIUM CITRATE PO SOLN: 1.0000 | Freq: Once | ORAL | Status: DC | PRN

## 2021-02-12 MED ORDER — CEFAZOLIN SODIUM-DEXTROSE 2-4 GM/100ML-% IV SOLN
2.0000 g | INTRAVENOUS | Status: AC
  Administered 2021-02-12: 2 g via INTRAVENOUS
  Filled 2021-02-12: qty 100

## 2021-02-12 MED ORDER — DOCUSATE SODIUM 100 MG PO CAPS
100.0000 mg | ORAL_CAPSULE | Freq: Two times a day (BID) | ORAL | Status: DC
  Administered 2021-02-12 – 2021-02-13 (×2): 100 mg via ORAL
  Filled 2021-02-12 (×2): qty 1

## 2021-02-12 MED ORDER — VANCOMYCIN HCL 1000 MG IV SOLR
INTRAVENOUS | Status: AC
  Filled 2021-02-12: qty 1000

## 2021-02-12 MED ORDER — HYDROMORPHONE HCL 1 MG/ML IJ SOLN: 0.5000 mg | INTRAMUSCULAR | Status: DC | PRN

## 2021-02-12 MED ORDER — ONDANSETRON HCL 4 MG PO TABS: 4.0000 mg | ORAL_TABLET | Freq: Four times a day (QID) | ORAL | Status: DC | PRN

## 2021-02-12 MED ORDER — PROPOFOL 10 MG/ML IV BOLUS
INTRAVENOUS | Status: DC | PRN
  Administered 2021-02-12: 90 mg via INTRAVENOUS

## 2021-02-12 SURGICAL SUPPLY — 74 items
ADH SKN CLS APL DERMABOND .7 (GAUZE/BANDAGES/DRESSINGS) ×1
AID PSTN UNV HD RSTRNT DISP (MISCELLANEOUS) ×1
BAG SPEC THK2 15X12 ZIP CLS (MISCELLANEOUS) ×1
BAG ZIPLOCK 12X15 (MISCELLANEOUS) ×2 IMPLANT
BLADE SAW SGTL 83.5X18.5 (BLADE) ×2 IMPLANT
BSPLAT GLND +2X24 MDLR (Joint) ×1 IMPLANT
COOLER ICEMAN CLASSIC (MISCELLANEOUS) ×1 IMPLANT
COVER BACK TABLE 60X90IN (DRAPES) ×2 IMPLANT
COVER SURGICAL LIGHT HANDLE (MISCELLANEOUS) ×2 IMPLANT
COVER WAND RF STERILE (DRAPES) ×1 IMPLANT
CUP SUT UNIV REVERS 36 NEUTRAL (Cup) ×1 IMPLANT
DERMABOND ADVANCED (GAUZE/BANDAGES/DRESSINGS) ×1
DERMABOND ADVANCED .7 DNX12 (GAUZE/BANDAGES/DRESSINGS) ×1 IMPLANT
DRAPE INCISE IOBAN 66X45 STRL (DRAPES) IMPLANT
DRAPE ORTHO SPLIT 77X108 STRL (DRAPES) ×4
DRAPE SHEET LG 3/4 BI-LAMINATE (DRAPES) ×2 IMPLANT
DRAPE SURG 17X11 SM STRL (DRAPES) ×2 IMPLANT
DRAPE SURG ORHT 6 SPLT 77X108 (DRAPES) ×2 IMPLANT
DRAPE U-SHAPE 47X51 STRL (DRAPES) ×2 IMPLANT
DRSG AQUACEL AG ADV 3.5X 6 (GAUZE/BANDAGES/DRESSINGS) ×2 IMPLANT
DRSG AQUACEL AG ADV 3.5X10 (GAUZE/BANDAGES/DRESSINGS) ×1 IMPLANT
DURAPREP 26ML APPLICATOR (WOUND CARE) ×2 IMPLANT
ELECT BLADE TIP CTD 4 INCH (ELECTRODE) ×2 IMPLANT
ELECT REM PT RETURN 15FT ADLT (MISCELLANEOUS) ×2 IMPLANT
FACESHIELD WRAPAROUND (MASK) ×8 IMPLANT
FACESHIELD WRAPAROUND OR TEAM (MASK) ×4 IMPLANT
FIBERTAPE CERCLAGE SUTURE ×1 IMPLANT
GLENOID UNI REV MOD 24 +2 LAT (Joint) ×1 IMPLANT
GLENOSPHERE 36 +4 LAT/24 (Joint) ×1 IMPLANT
GLOVE SURG ENC MOIS LTX SZ7.5 (GLOVE) ×2 IMPLANT
GLOVE SURG ENC MOIS LTX SZ8 (GLOVE) ×2 IMPLANT
GLOVE SURG MICRO LTX SZ7 (GLOVE) ×2 IMPLANT
GLOVE SURG MICRO LTX SZ7.5 (GLOVE) ×2 IMPLANT
GLOVE SURG SYN 7.0 (GLOVE) ×2 IMPLANT
GLOVE SURG SYN 7.0 PF PI (GLOVE) IMPLANT
GLOVE SURG SYN 7.5  E (GLOVE) ×2
GLOVE SURG SYN 7.5 E (GLOVE) ×1 IMPLANT
GLOVE SURG SYN 7.5 PF PI (GLOVE) IMPLANT
GLOVE SURG SYN 8.0 (GLOVE) IMPLANT
GLOVE SURG SYN 8.0 PF PI (GLOVE) IMPLANT
GOWN STRL REUS W/TWL LRG LVL3 (GOWN DISPOSABLE) ×4 IMPLANT
KIT BASIN OR (CUSTOM PROCEDURE TRAY) ×2 IMPLANT
KIT TURNOVER KIT A (KITS) ×2 IMPLANT
LINER HUMERAL 36 +3MM SM (Shoulder) ×1 IMPLANT
MANIFOLD NEPTUNE II (INSTRUMENTS) ×2 IMPLANT
NEEDLE TAPERED W/ NITINOL LOOP (MISCELLANEOUS) ×4 IMPLANT
NS IRRIG 1000ML POUR BTL (IV SOLUTION) ×2 IMPLANT
PACK SHOULDER (CUSTOM PROCEDURE TRAY) ×2 IMPLANT
PAD ARMBOARD 7.5X6 YLW CONV (MISCELLANEOUS) ×2 IMPLANT
PAD COLD SHLDR WRAP-ON (PAD) IMPLANT
PAD ORTHO SHOULDER 7X19 LRG (SOFTGOODS) ×1 IMPLANT
PIN NITINOL TARGETER 2.8 (PIN) IMPLANT
PIN SET MODULAR GLENOID SYSTEM (PIN) ×2 IMPLANT
RESTRAINT HEAD UNIVERSAL NS (MISCELLANEOUS) ×2 IMPLANT
SCREW CENTRAL MODULAR 25 (Screw) ×1 IMPLANT
SCREW PERI LOCK 5.5X16 (Screw) ×1 IMPLANT
SCREW PERIPHERAL 5.5X20 LOCK (Screw) ×3 IMPLANT
SLING ARM FOAM STRAP LRG (SOFTGOODS) IMPLANT
SLING ARM FOAM STRAP MED (SOFTGOODS) ×1 IMPLANT
SPONGE LAP 18X18 RF (DISPOSABLE) ×1 IMPLANT
STEM HUMERAL UNI REVERS SZ6 (Stem) ×2 IMPLANT
SUCTION FRAZIER HANDLE 12FR (TUBING) ×2
SUCTION TUBE FRAZIER 12FR DISP (TUBING) ×1 IMPLANT
SUT FIBERTAPE CERCLAGE 2 48 (SUTURE) ×1 IMPLANT
SUT FIBERWIRE #2 38 T-5 BLUE (SUTURE)
SUT MNCRL AB 3-0 PS2 18 (SUTURE) ×2 IMPLANT
SUT MON AB 2-0 CT1 36 (SUTURE) ×2 IMPLANT
SUT VIC AB 1 CT1 36 (SUTURE) ×2 IMPLANT
SUTURE FIBERWR #2 38 T-5 BLUE (SUTURE) IMPLANT
SUTURE TAPE 1.3 40 TPR END (SUTURE) ×2 IMPLANT
SUTURETAPE 1.3 40 TPR END (SUTURE) ×4
TOWEL OR 17X26 10 PK STRL BLUE (TOWEL DISPOSABLE) ×2 IMPLANT
TOWEL OR NON WOVEN STRL DISP B (DISPOSABLE) ×2 IMPLANT
WATER STERILE IRR 1000ML POUR (IV SOLUTION) ×4 IMPLANT

## 2021-02-12 NOTE — Op Note (Signed)
02/12/2021  3:12 PM  PATIENT:   Elizabeth Everett  87 y.o. female  PRE-OPERATIVE DIAGNOSIS:  Left 4-part proximal humerus fracture  POST-OPERATIVE DIAGNOSIS: Same  PROCEDURE: Left shoulder reverse arthroplasty utilizing a press-fit size 6 Arthrex stem with a neutral metaphysis, +3 polyethylene insert, 36/+4 glenosphere on a small/+2 baseplate  SURGEON:  Shalondra Wunschel, Metta Clines M.D.  ASSISTANTS: Jenetta Loges, PA-C  ANESTHESIA:   General endotracheal and interscalene block with Exparel  EBL: 150 cc  SPECIMEN: None  Drains: None   PATIENT DISPOSITION:  PACU - hemodynamically stable.    PLAN OF CARE: Admit for overnight observation  Brief history:  Elizabeth Everett is an 87 year old female who sustained a mechanical ground-level fall last week with immediate onset of severe left shoulder pain as well as swelling and inability to elevate the left arm.  Subsequent x-rays were obtained showing a severely impacted and displaced four-part proximal humerus fracture.  Due to the degree of displacement and deformity she was brought to the operating this time for planned left shoulder reverse arthroplasty.  Preoperatively, I counseled the patient regarding treatment options and risks versus benefits thereof.  Possible surgical complications were all reviewed including potential for bleeding, infection, neurovascular injury, persistent pain, loss of motion, anesthetic complication, failure of the implant, and possible need for additional surgery. They understand and accept and agrees with our planned procedure.   Procedure in detail:  After undergoing routine preop evaluation the patient received prophylactic antibiotics and interscalene block with Exparel was established in the holding area by the anesthesia department.  Patient was subsequently placed supine on the operating table and underwent smooth induction of a general endotracheal anesthesia.  Placed into the beachchair position and appropriately  padded protected.  Left shoulder girdle region was sterilely prepped and draped in standard fashion.  Timeout was called.  A deltopectoral approach left shoulder is made through an 8 cm incision.  Skin flaps were elevated dissection carried deeply the deltopectoral interval was developed from proximal to distal with the vein taken laterally.  The conjoined tendon was mobilized and retracted medially and the fracture site was readily identified.  We split initially along the rotator interval and appeared as though there may have been prior rupture the long head biceps tendon although this was used as a landmark helping to identify the subscapularis and a small remnant of the bone from the lesser tuberosity.  The bulk of the humeral head with less tuberosity fracture then dislocated posteriorly.  The subscapularis was tagged and reflected medially.  There was very little remnant of the rotator cuff superiorly with no significant portion to tag initially.  We manipulated the fracture site and utilized a towel clip to remove the entirety of the articular surface of the humeral head.  Some additional bone fragments were then removed.  I question whether there may have been a pre-existing chronic rotator cuff tear.  With the bone fragments removed we then exposed the glenoid and performed a circumferential labral resection and the prepared glenoid with our central guidepin and then reamed to a stable subchondral bony bed and then prepared with central drill and tap.  A small glenosphere with 25 mm lag screw was assembled and inserted into the glenoid with vancomycin powder spread upon the lag screw threads.  Excellent purchase and fixation was achieved.  The peripheral locking screws were all then placed using standard technique.  A 36/+4 glenosphere was then impacted onto the baseplate and a central locking screw was placed.  We then returned attention back to the proximal humerus where the canal was opened and we  broached initially to a size 6 which appeared of good canal fill.  I did prophylactically place a FiberWire cerclage around the humeral metaphysis to protect the bone and prevent propagation of a possible fracture.  Once this was secured we then performed terminal broaching with good stability at approximate 20 degrees retroversion of the implant.  Trial reduction was then performed showing good stability and good soft tissue balance.  The trial was then removed at this point the final implant was assembled on the back table.  Vancomycin powder was then spread into the canal and the implant was then terminally seated in room with skin tightening of the cerclage FiberWire and then performed once again terminal seeding the implant with excellent fit and fixation.  We then performed trial reductions and ultimately felt that the +3 polygave [place] the best soft tissue balance.  A final +3 polywas then impacted and final reduction was then performed which showed good motion good stability good soft tissue balance.  This time was able to identify a portion of the greater tuberosity with some posterior rotator cuff attachment and this was tagged with a suture tape and then repaired back to the eyelid on the collar of our implant and then the subscapularis was also repaired back to the eyelets on the collar of the implant adding stability to the construct.  The wounds and copes irrigated.  Hemostasis was obtained.  The balance of the vancomycin power was then spread liberally throughout the soft tissue planes.  The deltopectoral interval was reapproximated with a figure-of-eight and 1 Vicryl suture.  2-0 Monocryl used for subcu layer and intracuticular 3 Monocryl for the skin followed by Dermabond Aquacel dressing.  Left arm was then placed into a sling and the patient was awakened, extubated, and taken to recovery in stable condition.  Jenetta Loges, PA-C was utilized as an Environmental consultant throughout this case, essential for help  with positioning the patient, positioning extremity, tissue manipulation, implantation of the prosthesis, suture management, wound closure, and intraoperative decision-making.  Marin Shutter MD   Contact # [phone]

## 2021-02-12 NOTE — Anesthesia Procedure Notes (Signed)
Procedure Name: Intubation Performed by: Milford Cage, CRNA Pre-anesthesia Checklist: Patient identified, Emergency Drugs available, Suction available and Patient being monitored Patient Re-evaluated:Patient Re-evaluated prior to induction Oxygen Delivery Method: Circle System Utilized Preoxygenation: Pre-oxygenation with 100% oxygen Induction Type: IV induction Ventilation: Mask ventilation without difficulty Laryngoscope Size: Miller and 2 Grade View: Grade I Tube type: Oral Tube size: 7.0 mm Number of attempts: 1 Airway Equipment and Method: Stylet and Oral airway Placement Confirmation: ETT inserted through vocal cords under direct vision,  positive ETCO2 and breath sounds checked- equal and bilateral Secured at: 21 cm Tube secured with: Tape Dental Injury: Teeth and Oropharynx as per pre-operative assessment

## 2021-02-12 NOTE — Anesthesia Preprocedure Evaluation (Signed)
Anesthesia Evaluation  Patient identified by MRN, date of birth, ID band Patient awake    Reviewed: Allergy & Precautions, NPO status , Patient's Chart, lab work & pertinent test results  History of Anesthesia Complications Negative for: history of anesthetic complications  Airway Mallampati: II  TM Distance: >3 FB Neck ROM: Full    Dental  (+) Dental Advisory Given, Teeth Intact   Pulmonary neg pulmonary ROS,    Pulmonary exam normal        Cardiovascular hypertension, Normal cardiovascular exam+ dysrhythmias Atrial Fibrillation      Neuro/Psych negative neurological ROS     GI/Hepatic Neg liver ROS, GERD  ,  Endo/Other  negative endocrine ROS  Renal/GU Renal disease (Cr 1.08)  negative genitourinary   Musculoskeletal  (+) Arthritis ,   Abdominal   Peds  Hematology  (+) anemia , Eliquis   Anesthesia Other Findings  Cleared for surgery by Cardiology on 09/02/20. Last followup 12/28/20 with Dr. Burt Knack at which point she was stable from a cardiac standpoint.  Echo 01/04/19: EF 37-48%, normal diastolic function, mod LVH, normal RV function, severe LAD, no significant valve dysfunction  Reproductive/Obstetrics                           Anesthesia Physical Anesthesia Plan  ASA: III  Anesthesia Plan: General   Post-op Pain Management: GA combined w/ Regional for post-op pain   Induction: Intravenous  PONV Risk Score and Plan: 3 and Ondansetron, Dexamethasone and Treatment may vary due to age or medical condition  Airway Management Planned: Oral ETT  Additional Equipment: None  Intra-op Plan:   Post-operative Plan: Extubation in OR  Informed Consent: I have reviewed the patients History and Physical, chart, labs and discussed the procedure including the risks, benefits and alternatives for the proposed anesthesia with the patient or authorized representative who has indicated his/her  understanding and acceptance.     Dental advisory given  Plan Discussed with:   Anesthesia Plan Comments:         Anesthesia Quick Evaluation

## 2021-02-12 NOTE — Plan of Care (Signed)
  Problem: Education: Goal: Understanding of activity limitations/precautions following surgery will improve Outcome: Progressing   Problem: Activity: Goal: Ability to tolerate increased activity will improve Outcome: Progressing   Problem: Pain Management: Goal: Pain level will decrease with appropriate interventions Outcome: Progressing   Problem: Nutrition: Goal: Adequate nutrition will be maintained Outcome: Progressing

## 2021-02-12 NOTE — Transfer of Care (Signed)
Immediate Anesthesia Transfer of Care Note  Patient: Elizabeth Everett  Procedure(s) Performed: REVERSE SHOULDER ARTHROPLASTY (Left Shoulder)  Patient Location: PACU  Anesthesia Type:General  Level of Consciousness: awake  Airway & Oxygen Therapy: Patient Spontanous Breathing  Post-op Assessment: Report given to RN and Post -op Vital signs reviewed and stable  Post vital signs: Reviewed and stable  Last Vitals:  Vitals Value Taken Time  BP 145/60 02/12/21 1537  Temp 36.6 C 02/12/21 1537  Pulse 65 02/12/21 1544  Resp 17 02/12/21 1544  SpO2 98 % 02/12/21 1544  Vitals shown include unvalidated device data.  Last Pain:  Vitals:   02/12/21 1537  PainSc: 0-No pain         Complications: No complications documented.

## 2021-02-12 NOTE — Discharge Instructions (Signed)
 Kevin M. Supple, M.D., F.A.A.O.S. Orthopaedic Surgery Specializing in Arthroscopic and Reconstructive Surgery of the Shoulder [phone] [address] - [place], [place] [address] - Fax [phone]   POST-OP TOTAL SHOULDER REPLACEMENT INSTRUCTIONS  1. Follow up in the office for your first post-op appointment 10-14 days from the date of your surgery. If you do not already have a scheduled appointment, our office will contact you to schedule.  2. The bandage over your incision is waterproof. You may begin showering with this dressing on. You may leave this dressing on until first follow up appointment within 2 weeks. We prefer you leave this dressing in place until follow up however after 5-7 days if you are having itching or skin irritation and would like to remove it you may do so. Go slow and tug at the borders gently to break the bond the dressing has with the skin. At this point if there is no drainage it is okay to go without a bandage or you may cover it with a light guaze and tape. You can also expect significant bruising around your shoulder that will drift down your arm and into your chest wall. This is very normal and should resolve over several days.   3. Wear your sling/immobilizer at all times except to perform the exercises below or to occasionally let your arm dangle by your side to stretch your elbow. You also need to sleep in your sling immobilizer until instructed otherwise. It is ok to remove your sling if you are sitting in a controlled environment and allow your arm to rest in a position of comfort by your side or on your lap with pillows to give your neck and skin a break from the sling. You may remove it to allow arm to dangle by side to shower. If you are up walking around and when you go to sleep at night you need to wear it.  4. Range of motion to your elbow, wrist, and hand are encouraged 3-5 times daily. Exercise to your hand and fingers helps to reduce  swelling you may experience.   5. Prescriptions for a pain medication and a muscle relaxant are provided for you. It is recommended that if you are experiencing pain that you pain medication alone is not controlling, add the muscle relaxant along with the pain medication which can give additional pain relief. The first 1-2 days is generally the most severe of your pain and then should gradually decrease. As your pain lessens it is recommended that you decrease your use of the pain medications to an "as needed basis'" only and to always comply with the recommended dosages of the pain medications.  6. Pain medications can produce constipation along with their use. If you experience this, the use of an over the counter stool softener or laxative daily is recommended.   7. For additional questions or concerns, please do not hesitate to call the office. If after hours there is an answering service to forward your concerns to the physician on call.  8.Pain control following an exparel block  To help control your post-operative pain you received a nerve block  performed with Exparel which is a long acting anesthetic (numbing agent) which can provide pain relief and sensations of numbness (and relief of pain) in the operative shoulder and arm for up to 3 days. Sometimes it provides mixed relief, meaning you may still have numbness in certain areas of the arm but can still be able to   move  parts of that arm, hand, and fingers. We recommend that your prescribed pain medications  be used as needed. We do not feel it is necessary to "pre medicate" and "stay ahead" of pain.  Taking narcotic pain medications when you are not having any pain can lead to unnecessary and potentially dangerous side effects.    9. Use the ice machine as much as possible in the first 5-7 days from surgery, then you can wean its use to as needed. The ice typically needs to be replaced every 6 hours, instead of ice you can actually freeze  water bottles to put in the cooler and then fill water around them to avoid having to purchase ice. You can have spare water bottles freezing to allow you to rotate them once they have melted. Try to have a thin shirt or light cloth or towel under the ice wrap to protect your skin.   FOR ADDITIONAL INFO ON ICE MACHINE AND INSTRUCTIONS GO TO THE WEBSITE AT  [email or web address]  10.  We recommend that you avoid any dental work or cleaning in the first 3 months following your joint replacement. This is to help minimize the possibility of infection from the bacteria in your mouth that enters your bloodstream during dental work. We also recommend that you take an antibiotic prior to your dental work for the first year after your shoulder replacement to further help reduce that risk. Please simply contact our office for antibiotics to be sent to your pharmacy prior to dental work.  11. Dental Antibiotics:  In most cases prophylactic antibiotics for Dental procdeures after total joint surgery are not necessary.  Exceptions are as follows:  1. History of prior total joint infection  2. Severely immunocompromised (Organ Transplant, cancer chemotherapy, Rheumatoid biologic meds such as [place])  3. Poorly controlled diabetes (A1C &gt; 8.0, blood glucose over 200)  If you have one of these conditions, contact your surgeon for an antibiotic prescription, prior to your dental procedure.   POST-OP EXERCISES  Ok to allow arm to dangle and move elbow wrist and hand

## 2021-02-12 NOTE — H&P (Signed)
Elizabeth Everett    Chief Complaint: Left 4-part proximal humerus fracture HPI: The patient is a 86 y.o. female status post mechanical ground-level fall sustaining a severely displaced and comminuted left four-part proximal humerus fracture.  Due to the degree of displacement and instability she is brought to the operating room this time for planned left shoulder reverse arthroplasty  Past Medical History:  Diagnosis Date  . Acute respiratory failure with hypoxia and hypercapnia (HCC)   . Arthritis   . Atrial fibrillation ([place])   . Baker's cyst of knee    right   . Bradycardia   . Cancer ([place])    skin cancer; endometrial cancer   . Colonic pseudoobstruction   . Dizziness   . Dysrhythmia    afib   . Edema of lower extremity    bilat   . GERD (gastroesophageal reflux disease)   . Hard of hearing   . Heart murmur   . History of atrial flutter   . History of bronchitis   . History of kidney stones   . Hypercholesteremia   . Hypertension   . Macular degeneration   . Multiple falls   . Pancreatitis   . Scoliosis   . Shortness of breath dyspnea    can not climb full set of steps without stopping   . Toxic metabolic encephalopathy     Past Surgical History:  Procedure Laterality Date  . CARDIOVERSION    . DILATION AND CURETTAGE OF UTERUS    . FOOT SURGERY     bone surgery  . KNEE ARTHROSCOPY Right   . left breast lumpectomy      benign   . TONSILLECTOMY    . TOTAL KNEE ARTHROPLASTY Right 12/01/2015   Procedure: RIGHT TOTAL KNEE ARTHROPLASTY;  Surgeon: Paralee Cancel, MD;  Location: [facility];  Service: Orthopedics;  Laterality: Right;  Marland Kitchen VAGINAL HYSTERECTOMY      Family History  Problem Relation Age of Onset  . Lymphoma Father   . Macular degeneration Father   . Macular degeneration Mother     Social History:  reports that she has never smoked. She has never used smokeless tobacco. She reports current alcohol use. She reports that she does not use drugs.   Medications  Prior to Admission  Medication Sig Dispense Refill  . amiodarone (PACERONE) 200 MG tablet TAKE 1 TABLET BY MOUTH DAILY, MUST KEEP APPT IN FEBRUARY 2022 W/DR COOPER BEFORE ANYMORE REFILLS. (Patient taking differently: Take 200 mg by mouth daily.) 90 tablet 3  . cholecalciferol (VITAMIN D3) 25 MCG (1000 UNIT) tablet Take 1,000 Units by mouth daily.    . furosemide (LASIX) 20 MG tablet Take 1 tablet (20 mg total) by mouth daily. 30 tablet 11  . irbesartan (AVAPRO) 75 MG tablet Take 75 mg by mouth daily.    Marland Kitchen levothyroxine (SYNTHROID) 50 MCG tablet Take 50 mcg by mouth daily before breakfast.    . metoprolol tartrate (LOPRESSOR) 25 MG tablet TAKE 1 TABLET BY MOUTH TWICE A DAY (Patient taking differently: Take 25 mg by mouth 2 (two) times daily.) 60 tablet 7  . Multiple Vitamin (MULTIVITAMIN) capsule Take 1 capsule by mouth daily.    . Multiple Vitamins-Minerals (EYE VITAMINS PO) Take 1 tablet by mouth in the morning and at bedtime.    . pantoprazole (PROTONIX) 40 MG tablet TAKE 1 TABLET BY MOUTH EVERY DAY (Patient taking differently: Take 40 mg by mouth daily.) 90 tablet 3  . potassium chloride (KLOR-CON M10) 10 MEQ  tablet Every other day 15 tablet 2  . simvastatin (ZOCOR) 20 MG tablet TAKE 1 TABLET BY MOUTH EVERY DAY (Patient taking differently: Take 20 mg by mouth daily.) 90 tablet 3  . ELIQUIS 5 MG TABS tablet TAKE 1 TABLET BY MOUTH TWICE A DAY 180 tablet 1  . irbesartan (AVAPRO) 150 MG tablet Take 0.5 tablets (75 mg total) by mouth every evening. (Patient not taking: Reported on 02/04/2021) 15 tablet 11  . KLOR-CON 10 10 MEQ tablet TAKE 1 TABLET EVERY OTHER DAY (Patient not taking: Reported on 02/04/2021) 45 tablet 2     Physical Exam: Inspection of the left upper extremity reveals diffuse swelling with marked ecchymosis extending into the elbow forearm and hand.  She is grossly neurovascular intact.  Severe pain with palpation as well as attempts at passive motion.  Radiographs  Plain films are  reviewed which show a markedly comminuted and displaced left four-part proximal humerus fracture.  Vitals  Temp:  [98.2 F (36.8 C)] 98.2 F (36.8 C) (04/15 1113) Pulse Rate:  [64-65] 65 (04/15 1254) Resp:  [17-18] 18 (04/15 1254) BP: (117-177)/(57-99) 177/59 (04/15 1254) SpO2:  [96 %-100 %] 99 % (04/15 1254) Weight:  [65.5 kg] 65.5 kg (04/15 1113)  Assessment/Plan  Impression: Left 4-part proximal humerus fracture  Plan of Action: Procedure(s): REVERSE SHOULDER ARTHROPLASTY  Elizabeth Everett 02/12/2021, 1:28 PM Contact # [phone]

## 2021-02-12 NOTE — Anesthesia Postprocedure Evaluation (Signed)
Anesthesia Post Note  Patient: Elizabeth Everett  Procedure(s) Performed: REVERSE SHOULDER ARTHROPLASTY (Left Shoulder)     Patient location during evaluation: PACU Anesthesia Type: General Level of consciousness: awake and alert Pain management: pain level controlled Vital Signs Assessment: post-procedure vital signs reviewed and stable Respiratory status: spontaneous breathing, nonlabored ventilation and respiratory function stable Cardiovascular status: blood pressure returned to baseline and stable Postop Assessment: no apparent nausea or vomiting Anesthetic complications: no   No complications documented.  Last Vitals:  Vitals:   02/12/21 1600 02/12/21 1606  BP: (!) 134/53 (!) 134/53  Pulse: 65 66  Resp: (!) 22 18  Temp:  36.7 C  SpO2: 96% 98%    Last Pain:  Vitals:   02/12/21 1606  PainSc: 0-No pain                 Lidia Collum

## 2021-02-12 NOTE — Progress Notes (Signed)
AssistedDr. Bryson Ha with left, ultrasound guided, interscalene  block. Side rails up, monitors on throughout procedure. See vital signs in flow sheet. Tolerated Procedure well.

## 2021-02-12 NOTE — Anesthesia Procedure Notes (Signed)
Anesthesia Regional Block: Interscalene brachial plexus block   Pre-Anesthetic Checklist: ,, timeout performed, Correct Patient, Correct Site, Correct Laterality, Correct Procedure, Correct Position, site marked, Risks and benefits discussed,  Surgical consent,  Pre-op evaluation,  At surgeon's request and post-op pain management  Laterality: Left  Prep: chloraprep       Needles:  Injection technique: Single-shot  Needle Type: Echogenic Stimulator Needle     Needle Length: 10cm  Needle Gauge: 20     Additional Needles:   Procedures:,,,, ultrasound used (permanent image in chart),,,,  Narrative:  Start time: 02/12/2021 12:42 PM End time: 02/12/2021 12:46 PM Injection made incrementally with aspirations every 5 mL.  Performed by: Personally  Anesthesiologist: Lidia Collum, MD  Additional Notes: Standard monitors applied. Skin prepped. Good needle visualization with ultrasound. Injection made in 5cc increments with no resistance to injection. Patient tolerated the procedure well.

## 2021-02-13 DIAGNOSIS — R2681 Unsteadiness on feet: Secondary | ICD-10-CM | POA: Diagnosis not present

## 2021-02-13 DIAGNOSIS — E7800 Pure hypercholesterolemia, unspecified: Secondary | ICD-10-CM | POA: Diagnosis not present

## 2021-02-13 DIAGNOSIS — I10 Essential (primary) hypertension: Secondary | ICD-10-CM | POA: Diagnosis not present

## 2021-02-13 DIAGNOSIS — Z7901 Long term (current) use of anticoagulants: Secondary | ICD-10-CM | POA: Diagnosis not present

## 2021-02-13 DIAGNOSIS — S42202A Unspecified fracture of upper end of left humerus, initial encounter for closed fracture: Secondary | ICD-10-CM | POA: Diagnosis not present

## 2021-02-13 DIAGNOSIS — I4891 Unspecified atrial fibrillation: Secondary | ICD-10-CM | POA: Diagnosis not present

## 2021-02-13 NOTE — Discharge Summary (Signed)
PATIENT ID:      Elizabeth Everett  MRN:     [id number] DOB/AGE:    [birth year] / 87 y.o.     DISCHARGE SUMMARY  ADMISSION DATE:    02/12/2021 DISCHARGE DATE:    ADMISSION DIAGNOSIS: Left 4-part proximal humerus fracture Past Medical History:  Diagnosis Date  . Acute respiratory failure with hypoxia and hypercapnia (HCC)   . Arthritis   . Atrial fibrillation ([place])   . Baker's cyst of knee    right   . Bradycardia   . Cancer ([place])    skin cancer; endometrial cancer   . Colonic pseudoobstruction   . Dizziness   . Dysrhythmia    afib   . Edema of lower extremity    bilat   . GERD (gastroesophageal reflux disease)   . Hard of hearing   . Heart murmur   . History of atrial flutter   . History of bronchitis   . History of kidney stones   . Hypercholesteremia   . Hypertension   . Macular degeneration   . Multiple falls   . Pancreatitis   . Scoliosis   . Shortness of breath dyspnea    can not climb full set of steps without stopping   . Toxic metabolic encephalopathy     DISCHARGE DIAGNOSIS:   Active Problems:   S/P reverse total shoulder arthroplasty, left   PROCEDURE: Procedure(s): REVERSE SHOULDER ARTHROPLASTY on 02/12/2021  CONSULTS:    HISTORY:  See H&P in chart.  HOSPITAL COURSE:  Elizabeth Everett is a 87 y.o. admitted on 02/12/2021 with a diagnosis of Left 4-part proximal humerus fracture.  They were brought to the operating room on 02/12/2021 and underwent Procedure(s): [place].    They were given perioperative antibiotics:  Anti-infectives (From admission, onward)   Start     Dose/Rate Route Frequency Ordered Stop   02/12/21 1430  vancomycin (VANCOCIN) powder  Status:  Discontinued          As needed 02/12/21 1430 02/12/21 1632   02/12/21 1115  ceFAZolin (ANCEF) IVPB 2g/100 mL premix        2 g 200 mL/hr over 30 Minutes Intravenous On call to O.R. 02/12/21 1111 02/12/21 1353    .  Patient underwent the above named procedure and tolerated  it well. The following day they were hemodynamically stable and pain was controlled on oral analgesics. They were neurovascularly intact to the operative extremity. OT was ordered and worked with patient per protocol. They were medically and orthopaedically stable for discharge on .    DIAGNOSTIC STUDIES:  RECENT RADIOGRAPHIC STUDIES :  No results found.  RECENT VITAL SIGNS:   Patient Vitals for the past 24 hrs:  BP Temp Temp src Pulse Resp SpO2 Height Weight  02/13/21 0612 (!) 157/69 98 F (36.7 C) Oral 78 18 100 % -- --  02/13/21 0211 (!) 148/71 98 F (36.7 C) Oral 78 20 97 % -- --  02/12/21 2000 (!) 137/51 97.6 F (36.4 C) Oral 85 16 97 % -- --  02/12/21 1831 (!) 157/51 97.8 F (36.6 C) Oral 75 17 97 % -- --  02/12/21 1606 (!) 134/53 98.1 F (36.7 C) -- 66 18 98 % -- --  02/12/21 1600 (!) 134/53 -- -- 65 (!) 22 96 % -- --  02/12/21 1550 -- -- -- 64 (!) 21 98 % -- --  02/12/21 1545 (!) 138/44 -- -- 66 13 100 % -- --  02/12/21 1537 (!)  145/60 97.9 F (36.6 C) -- 68 17 97 % -- --  02/12/21 1254 (!) 177/59 -- -- 65 18 99 % -- --  02/12/21 1241 (!) 117/99 -- -- 64 17 96 % -- --  02/12/21 1127 -- -- -- -- -- -- 5' (1.524 m) --  02/12/21 1113 (!) 164/57 98.2 F (36.8 C) -- 65 -- 100 % -- 65.5 kg  .  RECENT EKG RESULTS:    Orders placed or performed in visit on 12/28/20  . EKG 12-Lead    DISCHARGE INSTRUCTIONS:    DISCHARGE MEDICATIONS:   Allergies as of 02/13/2021      Reactions   Evista [raloxifene] Other (See Comments)   Noted per H&P per [facility] 11/09/2015 causes migraine headache   Morphine    Hallucinations    Statins    Muscle/joint pain   Erythromycin Rash   Gentamicin Rash      Medication List    TAKE these medications   amiodarone 200 MG tablet Commonly known as: PACERONE TAKE 1 TABLET BY MOUTH DAILY, MUST KEEP APPT IN FEBRUARY 2022 W/DR COOPER BEFORE ANYMORE REFILLS. What changed: See the new instructions.   cholecalciferol 25 MCG (1000  UNIT) tablet Commonly known as: VITAMIN D3 Take 1,000 Units by mouth daily.   Eliquis 5 MG Tabs tablet Generic drug: apixaban TAKE 1 TABLET BY MOUTH TWICE A DAY   EYE VITAMINS PO Take 1 tablet by mouth in the morning and at bedtime.   furosemide 20 MG tablet Commonly known as: LASIX Take 1 tablet (20 mg total) by mouth daily.   HYDROcodone-acetaminophen 5-325 MG tablet Commonly known as: NORCO/VICODIN Take 1 tablet by mouth every 4 (four) hours as needed for severe pain.   irbesartan 75 MG tablet Commonly known as: AVAPRO Take 75 mg by mouth daily.   irbesartan 150 MG tablet Commonly known as: AVAPRO Take 0.5 tablets (75 mg total) by mouth every evening.   Klor-Con 10 10 MEQ tablet Generic drug: potassium chloride TAKE 1 TABLET EVERY OTHER DAY   levothyroxine 50 MCG tablet Commonly known as: SYNTHROID Take 50 mcg by mouth daily before breakfast.   metoprolol tartrate 25 MG tablet Commonly known as: LOPRESSOR TAKE 1 TABLET BY MOUTH TWICE A DAY   multivitamin capsule Take 1 capsule by mouth daily.   pantoprazole 40 MG tablet Commonly known as: PROTONIX TAKE 1 TABLET BY MOUTH EVERY DAY   potassium chloride 10 MEQ tablet Commonly known as: Klor-Con M10 Every other day   simvastatin 20 MG tablet Commonly known as: ZOCOR TAKE 1 TABLET BY MOUTH EVERY DAY   traMADol 50 MG tablet Commonly known as: ULTRAM Take 1 tablet (50 mg total) by mouth every 6 (six) hours as needed (for mild to moderate pain).       FOLLOW UP VISIT:    Follow-up Information    Justice Britain, MD.   Specialty: Orthopedic Surgery Why: on 4/27 at 2:00 Contact information: [address] [place] [address] [phone]               DISCHARGE TO: Home   DISCHARGE CONDITION:  Thereasa Parkin Derin Matthes for Dr. Justice Britain 02/13/2021, 8:21 AM

## 2021-02-13 NOTE — Evaluation (Signed)
Occupational Therapy Evaluation Patient Details Name: Elizabeth Everett MRN: [id number] DOB: 1932/02/23 Today's Date: 02/13/2021    History of Present Illness Pt is an 87 year old woman admitted for L reverse TSA after a fall resulting in 4 part humerus fx. Pt reports 3 falls in the last two weeks. PMH: HTN, afib, renal disease, arthritis, anemia.   Clinical Impression   Pt was independent prior to admission, driving. Presents with impaired balance, post operative pain and generalized weakness. All education completed and reinforced with written handouts. Pt has excellent family support for a home discharge.    Follow Up Recommendations  Follow surgeon's recommendation for DC plan and follow-up therapies    Equipment Recommendations  3 in 1 bedside commode    Recommendations for Other Services       Precautions / Restrictions Precautions Precautions: Fall;Shoulder Type of Shoulder Precautions: may use L UE during ADL within parameters of ER 10 degrees, FF 60 degrees, ABD 45 degrees, AROM elbow to hand Shoulder Interventions: Shoulder sling/immobilizer;Off for dressing/bathing/exercises Precaution Booklet Issued: Yes (comment) Required Braces or Orthoses: Sling Restrictions Weight Bearing Restrictions: Yes LUE Weight Bearing: Non weight bearing      Mobility Bed Mobility Overal bed mobility: Modified Independent             General bed mobility comments: with use of bed rail, recommended up to R side of bed    Transfers Overall transfer level: Needs assistance Equipment used: 1 person hand held assist Transfers: Sit to/from Stand Sit to Stand: Min assist         General transfer comment: steadying assist    Balance Overall balance assessment: Needs assistance   Sitting balance-Leahy Scale: Fair     Standing balance support: Single extremity supported Standing balance-Leahy Scale: Poor                             ADL either performed or  assessed with clinical judgement   ADL                                       Functional mobility during ADLs: Minimal assistance (hand held assist) General ADL Comments: Pt and family educated in compensatory strategies for ADL, L elbow to hand AROM, sling use, positioning L UE in bed and chair.     Vision Baseline Vision/History: Macular Degeneration Patient Visual Report: No change from baseline       Perception     Praxis      Pertinent Vitals/Pain Pain Assessment: Faces Faces Pain Scale: Hurts little more Pain Location: L shoulder Pain Descriptors / Indicators: Sore Pain Intervention(s): Repositioned;Premedicated before session;Monitored during session (intolerant of ice)     Hand Dominance Right   Extremity/Trunk Assessment Upper Extremity Assessment Upper Extremity Assessment: LUE deficits/detail LUE Deficits / Details: performed L elbow to hand AAROM within tolerance LUE: Unable to fully assess due to immobilization LUE Coordination: decreased gross motor   Lower Extremity Assessment Lower Extremity Assessment: Defer to PT evaluation       Communication Communication Communication: HOH   Cognition Arousal/Alertness: Awake/alert Behavior During Therapy: WFL for tasks assessed/performed Overall Cognitive Status: Within Functional Limits for tasks assessed  General Comments       Exercises     Shoulder Instructions      Home Living Family/patient expects to be discharged to:: Private residence Living Arrangements: Spouse/significant other Available Help at Discharge: Family;Available 24 hours/day Type of Home: House Home Access: Level entry     Home Layout: One level     Bathroom Shower/Tub: Occupational psychologist: Handicapped height     Home Equipment: Shower seat          Prior Functioning/Environment Level of Independence: Independent                  OT Problem List: Decreased strength;Impaired balance (sitting and/or standing);Decreased activity tolerance;Pain;Decreased knowledge of use of DME or AE      OT Treatment/Interventions:      OT Goals(Current goals can be found in the care plan section) Acute Rehab OT Goals Patient Stated Goal: return home, stop falling  OT Frequency:     Barriers to D/C:            Co-evaluation              AM-PAC OT "6 Clicks" Daily Activity     Outcome Measure Help from another person eating meals?: A Little Help from another person taking care of personal grooming?: A Little Help from another person toileting, which includes using toliet, bedpan, or urinal?: A Little Help from another person bathing (including washing, rinsing, drying)?: A Little Help from another person to put on and taking off regular upper body clothing?: A Little   6 Click Score: 15   End of Session Equipment Utilized During Treatment: Gait belt  Activity Tolerance: Patient tolerated treatment well Patient left: in chair;with call bell/phone within reach;with family/visitor present  OT Visit Diagnosis: Unsteadiness on feet (R26.81);Other abnormalities of gait and mobility (R26.89);Pain;Repeated falls (R29.6);Muscle weakness (generalized) (M62.81)                Time: 2956-2130 OT Time Calculation (min): 48 min Charges:  OT General Charges $OT Visit: 1 Visit OT Evaluation $OT Eval Moderate Complexity: 1 Mod OT Treatments $Self Care/Home Management : 8-22 mins $Therapeutic Exercise: 8-22 mins  Nestor Lewandowsky, OTR/L Acute Rehabilitation Services Pager: [phone] Office: [phone]  Elizabeth Everett 02/13/2021, 11:16 AM

## 2021-02-13 NOTE — TOC Progression Note (Signed)
Transition of Care [facility]) - Progression Note    Patient Details  Name: Elizabeth Everett MRN: [id number] Date of Birth: [birth year]  Transition of Care [facility]) CM/SW Contact  Joaquin Courts, RN Phone Number: 02/13/2021, 11:53 AM  Clinical Narrative:    Patient set up for HHPT/OT with [facility], rep Cindy given referral.  [employer] to deliver rolling walker and 3in1 to bedside for home use.     Expected Discharge Plan: [place] Barriers to Discharge: No Barriers Identified  Expected Discharge Plan and Services Expected Discharge Plan: [place]   Discharge Planning Services: CM Consult Post Acute Care Choice: [place] arrangements for the past 2 months: Single Family Home Expected Discharge Date: 02/13/21               DME Arranged: Gilford Rile rolling,3-N-1 DME Agency: Other - Comment [facility]) Date DME Agency Contacted: 02/13/21 Time DME Agency Contacted: 82 Representative spoke with at DME Agency: Brenton Grills HH Arranged: PT,OT [place]: [place] Date [place]: 02/13/21 Time [place]: 60 Representative spoke with at [place]: [place] Determinants of Health ([place]) Interventions    Readmission Risk Interventions No flowsheet data found.

## 2021-02-13 NOTE — Progress Notes (Signed)
Patient discharged to home w/ family. Given all belongings, instructions, equipment. Verbalzied understanding of instructions. Escorted to pov via w/c.

## 2021-02-13 NOTE — Evaluation (Signed)
Physical Therapy Evaluation Patient Details Name: Elizabeth Everett MRN: [id number] DOB: 1932/02/08 Today's Date: 02/13/2021   History of Present Illness  Pt is an 87 year old woman admitted for L reverse TSA after a fall resulting in 4 part humerus fx. Pt reports 3 falls in the last two weeks. PMH: HTN, afib, renal disease, arthritis, anemia.  Clinical Impression  Pt admitted with above diagnosis.  Pt with  unsteady gait, will need assist for all mobility at home initially for fall prevention.  Recommend HHPT and RW. Family educated and able to return demo HHA and one handed technique with RW.   Pt currently with functional limitations due to the deficits listed below (see PT Problem List). Pt will benefit from skilled PT to increase their independence and safety with mobility to allow discharge to the venue listed below.       Follow Up Recommendations Home health PT    Equipment Recommendations  Rolling walker with 5" wheels;3in1 (PT)    Recommendations for Other Services       Precautions / Restrictions Precautions Precautions: Fall;Shoulder Type of Shoulder Precautions: may use L UE during ADL within parameters of ER 10 degrees, FF 60 degrees, ABD 45 degrees, AROM elbow to hand Shoulder Interventions: Shoulder sling/immobilizer;Off for dressing/bathing/exercises Precaution Booklet Issued: Yes (comment) Required Braces or Orthoses: Sling Restrictions Weight Bearing Restrictions: Yes LUE Weight Bearing: Non weight bearing      Mobility  Bed Mobility Overal bed mobility: Modified Independent             General bed mobility comments: NT-in recliner    Transfers Overall transfer level: Needs assistance Equipment used: None Transfers: Sit to/from Stand Sit to Stand: Min assist         General transfer comment: steadying assist to rise and transition to RW  Ambulation/Gait Ambulation/Gait assistance: Min assist Gait Distance (Feet): 120 Feet Assistive device:  Rolling walker (2 wheeled);Straight cane Gait Pattern/deviations: Step-through pattern;Decreased stride length;Wide base of support Gait velocity: decr   General Gait Details: pt is extremely unsteady requiring min assist throughout for balance and safety; attempted [facility] however transitioned to one handed RW technique for incr support.  improved gait stability with RW vs SPC. fatigues easily. one standing rest d/t incr WOB  Stairs            Wheelchair Mobility    Modified Rankin (Stroke Patients Only)       Balance Overall balance assessment: Needs assistance   Sitting balance-Leahy Scale: Fair     Standing balance support: Single extremity supported Standing balance-Leahy Scale: Poor Standing balance comment: heavily reliant on UE support as well as external assist             High level balance activites: Direction changes;Turns;Head turns High Level Balance Comments: requires assist throughout to prevent fall/LOB with above activities.             Pertinent Vitals/Pain Pain Assessment: Faces Faces Pain Scale: Hurts little more Pain Location: L shoulder Pain Descriptors / Indicators: Sore Pain Intervention(s): Monitored during session;Limited activity within patient's tolerance    Home Living Family/patient expects to be discharged to:: Private residence Living Arrangements: Spouse/significant other Available Help at Discharge: Family;Available 24 hours/day Type of Home: House Home Access: Level entry     Home Layout: One level Home Equipment: Shower seat;Other (comment) Additional Comments: has 2 canes that are more walking sticks than actual canes    Prior Function Level of Independence: Independent  Comments: multiple falls     Hand Dominance   Dominant Hand: Right    Extremity/Trunk Assessment   Upper Extremity Assessment Upper Extremity Assessment: Defer to OT evaluation LUE Deficits / Details: performed L elbow to hand  AAROM within tolerance LUE: Unable to fully assess due to immobilization LUE Coordination: decreased gross motor    Lower Extremity Assessment Lower Extremity Assessment: Generalized weakness       Communication   Communication: HOH  Cognition Arousal/Alertness: Awake/alert Behavior During Therapy: WFL for tasks assessed/performed Overall Cognitive Status: Within Functional Limits for tasks assessed                                        General Comments      Exercises     Assessment/Plan    PT Assessment Patient needs continued PT services  PT Problem List Decreased strength;Decreased mobility;Decreased activity tolerance;Decreased balance;Decreased knowledge of use of DME       PT Treatment Interventions DME instruction;Therapeutic activities;Gait training;Functional mobility training;Therapeutic exercise;Patient/family education;Balance training    PT Goals (Current goals can be found in the Care Plan section)  Acute Rehab PT Goals Patient Stated Goal: return home, stop falling PT Goal Formulation: With patient Time For Goal Achievement: 02/20/21 Potential to Achieve Goals: Good    Frequency Min 3X/week   Barriers to discharge        Co-evaluation               AM-PAC PT "6 Clicks" Mobility  Outcome Measure Help needed turning from your back to your side while in a flat bed without using bedrails?: A Lot Help needed moving from lying on your back to sitting on the side of a flat bed without using bedrails?: A Lot Help needed moving to and from a bed to a chair (including a wheelchair)?: A Little Help needed standing up from a chair using your arms (e.g., wheelchair or bedside chair)?: A Little Help needed to walk in hospital room?: A Little Help needed climbing 3-5 steps with a railing? : A Lot 6 Click Score: 15    End of Session Equipment Utilized During Treatment: Gait belt Activity Tolerance: Patient tolerated treatment  well Patient left: in chair;with call bell/phone within reach;with family/visitor present Nurse Communication: Mobility status;Other (comment) (d/c needs) PT Visit Diagnosis: Other abnormalities of gait and mobility (R26.89);Difficulty in walking, not elsewhere classified (R26.2);Muscle weakness (generalized) (M62.81);History of falling (Z91.81)    Time: 1105-1130 PT Time Calculation (min) (ACUTE ONLY): 25 min   Charges:   PT Evaluation $PT Eval Low Complexity: 1 Low PT Treatments $Gait Training: 8-22 mins        Baxter Flattery, PT  Acute Rehab Dept ([place]) [phone] Pager [phone]  02/13/2021   [facility] 02/13/2021, 11:46 AM

## 2021-02-14 DIAGNOSIS — M159 Polyosteoarthritis, unspecified: Secondary | ICD-10-CM | POA: Diagnosis not present

## 2021-02-14 DIAGNOSIS — Z8542 Personal history of malignant neoplasm of other parts of uterus: Secondary | ICD-10-CM | POA: Diagnosis not present

## 2021-02-14 DIAGNOSIS — Z96651 Presence of right artificial knee joint: Secondary | ICD-10-CM | POA: Diagnosis not present

## 2021-02-14 DIAGNOSIS — Z9181 History of falling: Secondary | ICD-10-CM | POA: Diagnosis not present

## 2021-02-14 DIAGNOSIS — R42 Dizziness and giddiness: Secondary | ICD-10-CM | POA: Diagnosis not present

## 2021-02-14 DIAGNOSIS — W19XXXD Unspecified fall, subsequent encounter: Secondary | ICD-10-CM | POA: Diagnosis not present

## 2021-02-14 DIAGNOSIS — K219 Gastro-esophageal reflux disease without esophagitis: Secondary | ICD-10-CM | POA: Diagnosis not present

## 2021-02-14 DIAGNOSIS — Z96612 Presence of left artificial shoulder joint: Secondary | ICD-10-CM | POA: Diagnosis not present

## 2021-02-14 DIAGNOSIS — E7800 Pure hypercholesterolemia, unspecified: Secondary | ICD-10-CM | POA: Diagnosis not present

## 2021-02-14 DIAGNOSIS — Z7901 Long term (current) use of anticoagulants: Secondary | ICD-10-CM | POA: Diagnosis not present

## 2021-02-14 DIAGNOSIS — R011 Cardiac murmur, unspecified: Secondary | ICD-10-CM | POA: Diagnosis not present

## 2021-02-14 DIAGNOSIS — S42202D Unspecified fracture of upper end of left humerus, subsequent encounter for fracture with routine healing: Secondary | ICD-10-CM | POA: Diagnosis not present

## 2021-02-14 DIAGNOSIS — H3530 Unspecified macular degeneration: Secondary | ICD-10-CM | POA: Diagnosis not present

## 2021-02-14 DIAGNOSIS — I10 Essential (primary) hypertension: Secondary | ICD-10-CM | POA: Diagnosis not present

## 2021-02-14 DIAGNOSIS — J9621 Acute and chronic respiratory failure with hypoxia: Secondary | ICD-10-CM | POA: Diagnosis not present

## 2021-02-14 DIAGNOSIS — M419 Scoliosis, unspecified: Secondary | ICD-10-CM | POA: Diagnosis not present

## 2021-02-14 DIAGNOSIS — Z85828 Personal history of other malignant neoplasm of skin: Secondary | ICD-10-CM | POA: Diagnosis not present

## 2021-02-14 DIAGNOSIS — H9190 Unspecified hearing loss, unspecified ear: Secondary | ICD-10-CM | POA: Diagnosis not present

## 2021-02-14 DIAGNOSIS — J9622 Acute and chronic respiratory failure with hypercapnia: Secondary | ICD-10-CM | POA: Diagnosis not present

## 2021-02-14 DIAGNOSIS — I4891 Unspecified atrial fibrillation: Secondary | ICD-10-CM | POA: Diagnosis not present

## 2021-02-15 DIAGNOSIS — S42202D Unspecified fracture of upper end of left humerus, subsequent encounter for fracture with routine healing: Secondary | ICD-10-CM | POA: Diagnosis not present

## 2021-02-15 DIAGNOSIS — Z96612 Presence of left artificial shoulder joint: Secondary | ICD-10-CM | POA: Diagnosis not present

## 2021-02-15 DIAGNOSIS — J9622 Acute and chronic respiratory failure with hypercapnia: Secondary | ICD-10-CM | POA: Diagnosis not present

## 2021-02-15 DIAGNOSIS — I4891 Unspecified atrial fibrillation: Secondary | ICD-10-CM | POA: Diagnosis not present

## 2021-02-15 DIAGNOSIS — I10 Essential (primary) hypertension: Secondary | ICD-10-CM | POA: Diagnosis not present

## 2021-02-15 DIAGNOSIS — J9621 Acute and chronic respiratory failure with hypoxia: Secondary | ICD-10-CM | POA: Diagnosis not present

## 2021-02-17 ENCOUNTER — Encounter (HOSPITAL_COMMUNITY): Payer: Self-pay | Admitting: Orthopedic Surgery

## 2021-02-17 DIAGNOSIS — S42202D Unspecified fracture of upper end of left humerus, subsequent encounter for fracture with routine healing: Secondary | ICD-10-CM | POA: Diagnosis not present

## 2021-02-17 DIAGNOSIS — I10 Essential (primary) hypertension: Secondary | ICD-10-CM | POA: Diagnosis not present

## 2021-02-17 DIAGNOSIS — Z96612 Presence of left artificial shoulder joint: Secondary | ICD-10-CM | POA: Diagnosis not present

## 2021-02-17 DIAGNOSIS — J9621 Acute and chronic respiratory failure with hypoxia: Secondary | ICD-10-CM | POA: Diagnosis not present

## 2021-02-17 DIAGNOSIS — J9622 Acute and chronic respiratory failure with hypercapnia: Secondary | ICD-10-CM | POA: Diagnosis not present

## 2021-02-17 DIAGNOSIS — I4891 Unspecified atrial fibrillation: Secondary | ICD-10-CM | POA: Diagnosis not present

## 2021-02-23 DIAGNOSIS — I10 Essential (primary) hypertension: Secondary | ICD-10-CM | POA: Diagnosis not present

## 2021-02-23 DIAGNOSIS — Z96612 Presence of left artificial shoulder joint: Secondary | ICD-10-CM | POA: Diagnosis not present

## 2021-02-23 DIAGNOSIS — J9621 Acute and chronic respiratory failure with hypoxia: Secondary | ICD-10-CM | POA: Diagnosis not present

## 2021-02-23 DIAGNOSIS — J9622 Acute and chronic respiratory failure with hypercapnia: Secondary | ICD-10-CM | POA: Diagnosis not present

## 2021-02-23 DIAGNOSIS — I4891 Unspecified atrial fibrillation: Secondary | ICD-10-CM | POA: Diagnosis not present

## 2021-02-23 DIAGNOSIS — S42202D Unspecified fracture of upper end of left humerus, subsequent encounter for fracture with routine healing: Secondary | ICD-10-CM | POA: Diagnosis not present

## 2021-02-24 DIAGNOSIS — Z96612 Presence of left artificial shoulder joint: Secondary | ICD-10-CM | POA: Diagnosis not present

## 2021-02-24 DIAGNOSIS — Z471 Aftercare following joint replacement surgery: Secondary | ICD-10-CM | POA: Diagnosis not present

## 2021-02-25 DIAGNOSIS — I4891 Unspecified atrial fibrillation: Secondary | ICD-10-CM | POA: Diagnosis not present

## 2021-02-25 DIAGNOSIS — Z96612 Presence of left artificial shoulder joint: Secondary | ICD-10-CM | POA: Diagnosis not present

## 2021-02-25 DIAGNOSIS — J9622 Acute and chronic respiratory failure with hypercapnia: Secondary | ICD-10-CM | POA: Diagnosis not present

## 2021-02-25 DIAGNOSIS — I10 Essential (primary) hypertension: Secondary | ICD-10-CM | POA: Diagnosis not present

## 2021-02-25 DIAGNOSIS — S42202D Unspecified fracture of upper end of left humerus, subsequent encounter for fracture with routine healing: Secondary | ICD-10-CM | POA: Diagnosis not present

## 2021-02-25 DIAGNOSIS — J9621 Acute and chronic respiratory failure with hypoxia: Secondary | ICD-10-CM | POA: Diagnosis not present

## 2021-02-27 DIAGNOSIS — E785 Hyperlipidemia, unspecified: Secondary | ICD-10-CM | POA: Diagnosis not present

## 2021-02-27 DIAGNOSIS — E039 Hypothyroidism, unspecified: Secondary | ICD-10-CM | POA: Diagnosis not present

## 2021-02-27 DIAGNOSIS — I10 Essential (primary) hypertension: Secondary | ICD-10-CM | POA: Diagnosis not present

## 2021-03-02 DIAGNOSIS — S42202D Unspecified fracture of upper end of left humerus, subsequent encounter for fracture with routine healing: Secondary | ICD-10-CM | POA: Diagnosis not present

## 2021-03-02 DIAGNOSIS — J9621 Acute and chronic respiratory failure with hypoxia: Secondary | ICD-10-CM | POA: Diagnosis not present

## 2021-03-02 DIAGNOSIS — I10 Essential (primary) hypertension: Secondary | ICD-10-CM | POA: Diagnosis not present

## 2021-03-02 DIAGNOSIS — I4891 Unspecified atrial fibrillation: Secondary | ICD-10-CM | POA: Diagnosis not present

## 2021-03-02 DIAGNOSIS — J9622 Acute and chronic respiratory failure with hypercapnia: Secondary | ICD-10-CM | POA: Diagnosis not present

## 2021-03-02 DIAGNOSIS — Z96612 Presence of left artificial shoulder joint: Secondary | ICD-10-CM | POA: Diagnosis not present

## 2021-03-03 DIAGNOSIS — J9621 Acute and chronic respiratory failure with hypoxia: Secondary | ICD-10-CM | POA: Diagnosis not present

## 2021-03-03 DIAGNOSIS — S42202D Unspecified fracture of upper end of left humerus, subsequent encounter for fracture with routine healing: Secondary | ICD-10-CM | POA: Diagnosis not present

## 2021-03-03 DIAGNOSIS — I10 Essential (primary) hypertension: Secondary | ICD-10-CM | POA: Diagnosis not present

## 2021-03-03 DIAGNOSIS — J9622 Acute and chronic respiratory failure with hypercapnia: Secondary | ICD-10-CM | POA: Diagnosis not present

## 2021-03-03 DIAGNOSIS — I4891 Unspecified atrial fibrillation: Secondary | ICD-10-CM | POA: Diagnosis not present

## 2021-03-03 DIAGNOSIS — Z96612 Presence of left artificial shoulder joint: Secondary | ICD-10-CM | POA: Diagnosis not present

## 2021-03-09 DIAGNOSIS — Z96612 Presence of left artificial shoulder joint: Secondary | ICD-10-CM | POA: Diagnosis not present

## 2021-03-09 DIAGNOSIS — J9621 Acute and chronic respiratory failure with hypoxia: Secondary | ICD-10-CM | POA: Diagnosis not present

## 2021-03-09 DIAGNOSIS — I4891 Unspecified atrial fibrillation: Secondary | ICD-10-CM | POA: Diagnosis not present

## 2021-03-09 DIAGNOSIS — I10 Essential (primary) hypertension: Secondary | ICD-10-CM | POA: Diagnosis not present

## 2021-03-09 DIAGNOSIS — S42202D Unspecified fracture of upper end of left humerus, subsequent encounter for fracture with routine healing: Secondary | ICD-10-CM | POA: Diagnosis not present

## 2021-03-09 DIAGNOSIS — J9622 Acute and chronic respiratory failure with hypercapnia: Secondary | ICD-10-CM | POA: Diagnosis not present

## 2021-03-11 ENCOUNTER — Other Ambulatory Visit: Payer: Self-pay | Admitting: Cardiovascular Disease

## 2021-03-15 DIAGNOSIS — J9621 Acute and chronic respiratory failure with hypoxia: Secondary | ICD-10-CM | POA: Diagnosis not present

## 2021-03-15 DIAGNOSIS — S42202D Unspecified fracture of upper end of left humerus, subsequent encounter for fracture with routine healing: Secondary | ICD-10-CM | POA: Diagnosis not present

## 2021-03-15 DIAGNOSIS — I10 Essential (primary) hypertension: Secondary | ICD-10-CM | POA: Diagnosis not present

## 2021-03-15 DIAGNOSIS — I4891 Unspecified atrial fibrillation: Secondary | ICD-10-CM | POA: Diagnosis not present

## 2021-03-15 DIAGNOSIS — J9622 Acute and chronic respiratory failure with hypercapnia: Secondary | ICD-10-CM | POA: Diagnosis not present

## 2021-03-15 DIAGNOSIS — Z96612 Presence of left artificial shoulder joint: Secondary | ICD-10-CM | POA: Diagnosis not present

## 2021-03-16 DIAGNOSIS — J9622 Acute and chronic respiratory failure with hypercapnia: Secondary | ICD-10-CM | POA: Diagnosis not present

## 2021-03-16 DIAGNOSIS — J9621 Acute and chronic respiratory failure with hypoxia: Secondary | ICD-10-CM | POA: Diagnosis not present

## 2021-03-16 DIAGNOSIS — S42202D Unspecified fracture of upper end of left humerus, subsequent encounter for fracture with routine healing: Secondary | ICD-10-CM | POA: Diagnosis not present

## 2021-03-16 DIAGNOSIS — I10 Essential (primary) hypertension: Secondary | ICD-10-CM | POA: Diagnosis not present

## 2021-03-16 DIAGNOSIS — H3530 Unspecified macular degeneration: Secondary | ICD-10-CM | POA: Diagnosis not present

## 2021-03-16 DIAGNOSIS — E7800 Pure hypercholesterolemia, unspecified: Secondary | ICD-10-CM | POA: Diagnosis not present

## 2021-03-16 DIAGNOSIS — R42 Dizziness and giddiness: Secondary | ICD-10-CM | POA: Diagnosis not present

## 2021-03-16 DIAGNOSIS — I4891 Unspecified atrial fibrillation: Secondary | ICD-10-CM | POA: Diagnosis not present

## 2021-03-16 DIAGNOSIS — W19XXXD Unspecified fall, subsequent encounter: Secondary | ICD-10-CM | POA: Diagnosis not present

## 2021-03-16 DIAGNOSIS — Z9181 History of falling: Secondary | ICD-10-CM | POA: Diagnosis not present

## 2021-03-16 DIAGNOSIS — Z85828 Personal history of other malignant neoplasm of skin: Secondary | ICD-10-CM | POA: Diagnosis not present

## 2021-03-16 DIAGNOSIS — H9190 Unspecified hearing loss, unspecified ear: Secondary | ICD-10-CM | POA: Diagnosis not present

## 2021-03-16 DIAGNOSIS — Z96612 Presence of left artificial shoulder joint: Secondary | ICD-10-CM | POA: Diagnosis not present

## 2021-03-16 DIAGNOSIS — M159 Polyosteoarthritis, unspecified: Secondary | ICD-10-CM | POA: Diagnosis not present

## 2021-03-16 DIAGNOSIS — Z8542 Personal history of malignant neoplasm of other parts of uterus: Secondary | ICD-10-CM | POA: Diagnosis not present

## 2021-03-16 DIAGNOSIS — R011 Cardiac murmur, unspecified: Secondary | ICD-10-CM | POA: Diagnosis not present

## 2021-03-16 DIAGNOSIS — K219 Gastro-esophageal reflux disease without esophagitis: Secondary | ICD-10-CM | POA: Diagnosis not present

## 2021-03-16 DIAGNOSIS — Z7901 Long term (current) use of anticoagulants: Secondary | ICD-10-CM | POA: Diagnosis not present

## 2021-03-16 DIAGNOSIS — Z96651 Presence of right artificial knee joint: Secondary | ICD-10-CM | POA: Diagnosis not present

## 2021-03-16 DIAGNOSIS — M419 Scoliosis, unspecified: Secondary | ICD-10-CM | POA: Diagnosis not present

## 2021-03-19 DIAGNOSIS — J9621 Acute and chronic respiratory failure with hypoxia: Secondary | ICD-10-CM | POA: Diagnosis not present

## 2021-03-19 DIAGNOSIS — Z96612 Presence of left artificial shoulder joint: Secondary | ICD-10-CM | POA: Diagnosis not present

## 2021-03-19 DIAGNOSIS — J9622 Acute and chronic respiratory failure with hypercapnia: Secondary | ICD-10-CM | POA: Diagnosis not present

## 2021-03-19 DIAGNOSIS — S42202D Unspecified fracture of upper end of left humerus, subsequent encounter for fracture with routine healing: Secondary | ICD-10-CM | POA: Diagnosis not present

## 2021-03-19 DIAGNOSIS — I4891 Unspecified atrial fibrillation: Secondary | ICD-10-CM | POA: Diagnosis not present

## 2021-03-19 DIAGNOSIS — I10 Essential (primary) hypertension: Secondary | ICD-10-CM | POA: Diagnosis not present

## 2021-03-23 DIAGNOSIS — J9622 Acute and chronic respiratory failure with hypercapnia: Secondary | ICD-10-CM | POA: Diagnosis not present

## 2021-03-23 DIAGNOSIS — I4891 Unspecified atrial fibrillation: Secondary | ICD-10-CM | POA: Diagnosis not present

## 2021-03-23 DIAGNOSIS — I10 Essential (primary) hypertension: Secondary | ICD-10-CM | POA: Diagnosis not present

## 2021-03-23 DIAGNOSIS — S42202D Unspecified fracture of upper end of left humerus, subsequent encounter for fracture with routine healing: Secondary | ICD-10-CM | POA: Diagnosis not present

## 2021-03-23 DIAGNOSIS — Z96612 Presence of left artificial shoulder joint: Secondary | ICD-10-CM | POA: Diagnosis not present

## 2021-03-23 DIAGNOSIS — J9621 Acute and chronic respiratory failure with hypoxia: Secondary | ICD-10-CM | POA: Diagnosis not present

## 2021-03-24 DIAGNOSIS — J9622 Acute and chronic respiratory failure with hypercapnia: Secondary | ICD-10-CM | POA: Diagnosis not present

## 2021-03-24 DIAGNOSIS — Z96612 Presence of left artificial shoulder joint: Secondary | ICD-10-CM | POA: Diagnosis not present

## 2021-03-24 DIAGNOSIS — I10 Essential (primary) hypertension: Secondary | ICD-10-CM | POA: Diagnosis not present

## 2021-03-24 DIAGNOSIS — S42202D Unspecified fracture of upper end of left humerus, subsequent encounter for fracture with routine healing: Secondary | ICD-10-CM | POA: Diagnosis not present

## 2021-03-24 DIAGNOSIS — I4891 Unspecified atrial fibrillation: Secondary | ICD-10-CM | POA: Diagnosis not present

## 2021-03-24 DIAGNOSIS — J9621 Acute and chronic respiratory failure with hypoxia: Secondary | ICD-10-CM | POA: Diagnosis not present

## 2021-03-25 DIAGNOSIS — I10 Essential (primary) hypertension: Secondary | ICD-10-CM | POA: Diagnosis not present

## 2021-03-25 DIAGNOSIS — I4891 Unspecified atrial fibrillation: Secondary | ICD-10-CM | POA: Diagnosis not present

## 2021-03-25 DIAGNOSIS — J9622 Acute and chronic respiratory failure with hypercapnia: Secondary | ICD-10-CM | POA: Diagnosis not present

## 2021-03-25 DIAGNOSIS — J9621 Acute and chronic respiratory failure with hypoxia: Secondary | ICD-10-CM | POA: Diagnosis not present

## 2021-03-25 DIAGNOSIS — S42202D Unspecified fracture of upper end of left humerus, subsequent encounter for fracture with routine healing: Secondary | ICD-10-CM | POA: Diagnosis not present

## 2021-03-25 DIAGNOSIS — Z96612 Presence of left artificial shoulder joint: Secondary | ICD-10-CM | POA: Diagnosis not present

## 2021-03-30 DIAGNOSIS — S42202D Unspecified fracture of upper end of left humerus, subsequent encounter for fracture with routine healing: Secondary | ICD-10-CM | POA: Diagnosis not present

## 2021-03-30 DIAGNOSIS — I10 Essential (primary) hypertension: Secondary | ICD-10-CM | POA: Diagnosis not present

## 2021-03-30 DIAGNOSIS — J9622 Acute and chronic respiratory failure with hypercapnia: Secondary | ICD-10-CM | POA: Diagnosis not present

## 2021-03-30 DIAGNOSIS — I4891 Unspecified atrial fibrillation: Secondary | ICD-10-CM | POA: Diagnosis not present

## 2021-03-30 DIAGNOSIS — J9621 Acute and chronic respiratory failure with hypoxia: Secondary | ICD-10-CM | POA: Diagnosis not present

## 2021-03-30 DIAGNOSIS — Z96612 Presence of left artificial shoulder joint: Secondary | ICD-10-CM | POA: Diagnosis not present

## 2021-03-31 DIAGNOSIS — Z471 Aftercare following joint replacement surgery: Secondary | ICD-10-CM | POA: Diagnosis not present

## 2021-03-31 DIAGNOSIS — Z96612 Presence of left artificial shoulder joint: Secondary | ICD-10-CM | POA: Diagnosis not present

## 2021-04-01 DIAGNOSIS — S42202D Unspecified fracture of upper end of left humerus, subsequent encounter for fracture with routine healing: Secondary | ICD-10-CM | POA: Diagnosis not present

## 2021-04-01 DIAGNOSIS — J9622 Acute and chronic respiratory failure with hypercapnia: Secondary | ICD-10-CM | POA: Diagnosis not present

## 2021-04-01 DIAGNOSIS — I4891 Unspecified atrial fibrillation: Secondary | ICD-10-CM | POA: Diagnosis not present

## 2021-04-01 DIAGNOSIS — Z96612 Presence of left artificial shoulder joint: Secondary | ICD-10-CM | POA: Diagnosis not present

## 2021-04-01 DIAGNOSIS — I10 Essential (primary) hypertension: Secondary | ICD-10-CM | POA: Diagnosis not present

## 2021-04-01 DIAGNOSIS — J9621 Acute and chronic respiratory failure with hypoxia: Secondary | ICD-10-CM | POA: Diagnosis not present

## 2021-04-02 DIAGNOSIS — Z23 Encounter for immunization: Secondary | ICD-10-CM | POA: Diagnosis not present

## 2021-04-05 DIAGNOSIS — M25512 Pain in left shoulder: Secondary | ICD-10-CM | POA: Diagnosis not present

## 2021-04-05 DIAGNOSIS — M6281 Muscle weakness (generalized): Secondary | ICD-10-CM | POA: Diagnosis not present

## 2021-04-08 DIAGNOSIS — M6281 Muscle weakness (generalized): Secondary | ICD-10-CM | POA: Diagnosis not present

## 2021-04-08 DIAGNOSIS — M25512 Pain in left shoulder: Secondary | ICD-10-CM | POA: Diagnosis not present

## 2021-04-13 DIAGNOSIS — M6281 Muscle weakness (generalized): Secondary | ICD-10-CM | POA: Diagnosis not present

## 2021-04-13 DIAGNOSIS — M25512 Pain in left shoulder: Secondary | ICD-10-CM | POA: Diagnosis not present

## 2021-04-15 DIAGNOSIS — M25512 Pain in left shoulder: Secondary | ICD-10-CM | POA: Diagnosis not present

## 2021-04-15 DIAGNOSIS — M6281 Muscle weakness (generalized): Secondary | ICD-10-CM | POA: Diagnosis not present

## 2021-04-28 DIAGNOSIS — M6281 Muscle weakness (generalized): Secondary | ICD-10-CM | POA: Diagnosis not present

## 2021-04-28 DIAGNOSIS — M25512 Pain in left shoulder: Secondary | ICD-10-CM | POA: Diagnosis not present

## 2021-04-30 DIAGNOSIS — M6281 Muscle weakness (generalized): Secondary | ICD-10-CM | POA: Diagnosis not present

## 2021-04-30 DIAGNOSIS — M25512 Pain in left shoulder: Secondary | ICD-10-CM | POA: Diagnosis not present

## 2021-05-04 DIAGNOSIS — H31013 Macula scars of posterior pole (postinflammatory) (post-traumatic), bilateral: Secondary | ICD-10-CM | POA: Diagnosis not present

## 2021-05-04 DIAGNOSIS — H35363 Drusen (degenerative) of macula, bilateral: Secondary | ICD-10-CM | POA: Diagnosis not present

## 2021-05-04 DIAGNOSIS — Z961 Presence of intraocular lens: Secondary | ICD-10-CM | POA: Diagnosis not present

## 2021-05-04 DIAGNOSIS — H353233 Exudative age-related macular degeneration, bilateral, with inactive scar: Secondary | ICD-10-CM | POA: Diagnosis not present

## 2021-05-10 DIAGNOSIS — M6281 Muscle weakness (generalized): Secondary | ICD-10-CM | POA: Diagnosis not present

## 2021-05-10 DIAGNOSIS — M25512 Pain in left shoulder: Secondary | ICD-10-CM | POA: Diagnosis not present

## 2021-05-12 DIAGNOSIS — Z471 Aftercare following joint replacement surgery: Secondary | ICD-10-CM | POA: Diagnosis not present

## 2021-05-12 DIAGNOSIS — Z96612 Presence of left artificial shoulder joint: Secondary | ICD-10-CM | POA: Diagnosis not present

## 2021-05-14 DIAGNOSIS — M6281 Muscle weakness (generalized): Secondary | ICD-10-CM | POA: Diagnosis not present

## 2021-05-14 DIAGNOSIS — M25512 Pain in left shoulder: Secondary | ICD-10-CM | POA: Diagnosis not present

## 2021-05-21 DIAGNOSIS — M6281 Muscle weakness (generalized): Secondary | ICD-10-CM | POA: Diagnosis not present

## 2021-05-21 DIAGNOSIS — M25512 Pain in left shoulder: Secondary | ICD-10-CM | POA: Diagnosis not present

## 2021-05-25 DIAGNOSIS — M25512 Pain in left shoulder: Secondary | ICD-10-CM | POA: Diagnosis not present

## 2021-05-25 DIAGNOSIS — M6281 Muscle weakness (generalized): Secondary | ICD-10-CM | POA: Diagnosis not present

## 2021-05-28 DIAGNOSIS — M6281 Muscle weakness (generalized): Secondary | ICD-10-CM | POA: Diagnosis not present

## 2021-05-28 DIAGNOSIS — M25512 Pain in left shoulder: Secondary | ICD-10-CM | POA: Diagnosis not present

## 2021-06-03 ENCOUNTER — Other Ambulatory Visit: Payer: Self-pay | Admitting: Cardiovascular Disease

## 2021-06-03 DIAGNOSIS — H43813 Vitreous degeneration, bilateral: Secondary | ICD-10-CM | POA: Diagnosis not present

## 2021-06-03 DIAGNOSIS — H353232 Exudative age-related macular degeneration, bilateral, with inactive choroidal neovascularization: Secondary | ICD-10-CM | POA: Diagnosis not present

## 2021-06-18 DIAGNOSIS — M6281 Muscle weakness (generalized): Secondary | ICD-10-CM | POA: Diagnosis not present

## 2021-06-18 DIAGNOSIS — M25512 Pain in left shoulder: Secondary | ICD-10-CM | POA: Diagnosis not present

## 2021-06-22 DIAGNOSIS — M6281 Muscle weakness (generalized): Secondary | ICD-10-CM | POA: Diagnosis not present

## 2021-06-22 DIAGNOSIS — M25512 Pain in left shoulder: Secondary | ICD-10-CM | POA: Diagnosis not present

## 2021-06-25 DIAGNOSIS — M6281 Muscle weakness (generalized): Secondary | ICD-10-CM | POA: Diagnosis not present

## 2021-06-25 DIAGNOSIS — M25512 Pain in left shoulder: Secondary | ICD-10-CM | POA: Diagnosis not present

## 2021-06-28 DIAGNOSIS — Z96612 Presence of left artificial shoulder joint: Secondary | ICD-10-CM | POA: Diagnosis not present

## 2021-07-26 DIAGNOSIS — Z96612 Presence of left artificial shoulder joint: Secondary | ICD-10-CM | POA: Diagnosis not present

## 2021-07-27 ENCOUNTER — Other Ambulatory Visit: Payer: Self-pay | Admitting: Orthopedic Surgery

## 2021-07-27 ENCOUNTER — Other Ambulatory Visit (HOSPITAL_COMMUNITY): Payer: Self-pay | Admitting: Orthopedic Surgery

## 2021-07-27 DIAGNOSIS — Z96612 Presence of left artificial shoulder joint: Secondary | ICD-10-CM

## 2021-07-28 DIAGNOSIS — Z96612 Presence of left artificial shoulder joint: Secondary | ICD-10-CM | POA: Diagnosis not present

## 2021-08-03 DIAGNOSIS — L821 Other seborrheic keratosis: Secondary | ICD-10-CM | POA: Diagnosis not present

## 2021-08-03 DIAGNOSIS — L578 Other skin changes due to chronic exposure to nonionizing radiation: Secondary | ICD-10-CM | POA: Diagnosis not present

## 2021-08-03 DIAGNOSIS — Z859 Personal history of malignant neoplasm, unspecified: Secondary | ICD-10-CM | POA: Diagnosis not present

## 2021-08-03 DIAGNOSIS — Z85820 Personal history of malignant melanoma of skin: Secondary | ICD-10-CM | POA: Diagnosis not present

## 2021-08-04 ENCOUNTER — Other Ambulatory Visit: Payer: Self-pay

## 2021-08-04 ENCOUNTER — Encounter (HOSPITAL_COMMUNITY)
Admission: RE | Admit: 2021-08-04 | Discharge: 2021-08-04 | Disposition: A | Discharge status: Home / Self Care | Payer: Medicare Other | Source: Ambulatory Visit | Attending: Orthopedic Surgery | Admitting: Orthopedic Surgery

## 2021-08-04 ENCOUNTER — Ambulatory Visit (HOSPITAL_COMMUNITY)
Admission: RE | Admit: 2021-08-04 | Discharge: 2021-08-04 | Disposition: A | Discharge status: Home / Self Care | Payer: Medicare Other | Source: Ambulatory Visit | Attending: Orthopedic Surgery | Admitting: Orthopedic Surgery

## 2021-08-04 DIAGNOSIS — Z96612 Presence of left artificial shoulder joint: Secondary | ICD-10-CM | POA: Insufficient documentation

## 2021-08-04 DIAGNOSIS — M419 Scoliosis, unspecified: Secondary | ICD-10-CM | POA: Diagnosis not present

## 2021-08-04 DIAGNOSIS — Z96642 Presence of left artificial hip joint: Secondary | ICD-10-CM | POA: Diagnosis not present

## 2021-08-04 DIAGNOSIS — M25512 Pain in left shoulder: Secondary | ICD-10-CM | POA: Diagnosis not present

## 2021-08-04 MED ORDER — TECHNETIUM TC 99M MEDRONATE IV KIT: 19.0000 | PACK | Freq: Once | INTRAVENOUS | Status: DC

## 2021-08-05 ENCOUNTER — Other Ambulatory Visit: Payer: Self-pay | Admitting: Cardiovascular Disease

## 2021-08-05 NOTE — Telephone Encounter (Signed)
Prescription refill request for Eliquis received. Indication:Afib  Last office visit: 12/28/20 Elizabeth Everett)  Scr: 1.08 (02/09/21) Age: 87 Weight: 65.5kg  Appropriate dose and refill sent to requested pharmacy.

## 2021-08-14 ENCOUNTER — Other Ambulatory Visit: Payer: Self-pay | Admitting: Cardiovascular Disease

## 2021-08-16 DIAGNOSIS — Z23 Encounter for immunization: Secondary | ICD-10-CM | POA: Diagnosis not present

## 2021-08-23 DIAGNOSIS — M25512 Pain in left shoulder: Secondary | ICD-10-CM | POA: Diagnosis not present

## 2021-08-23 DIAGNOSIS — Z96612 Presence of left artificial shoulder joint: Secondary | ICD-10-CM | POA: Diagnosis not present

## 2021-08-25 ENCOUNTER — Other Ambulatory Visit: Payer: Self-pay | Admitting: Urology

## 2021-08-25 DIAGNOSIS — D49512 Neoplasm of unspecified behavior of left kidney: Secondary | ICD-10-CM

## 2021-10-01 DIAGNOSIS — Z23 Encounter for immunization: Secondary | ICD-10-CM | POA: Diagnosis not present

## 2021-11-10 ENCOUNTER — Other Ambulatory Visit: Payer: Self-pay

## 2021-11-10 DIAGNOSIS — E7800 Pure hypercholesterolemia, unspecified: Secondary | ICD-10-CM

## 2021-11-10 DIAGNOSIS — D49512 Neoplasm of unspecified behavior of left kidney: Secondary | ICD-10-CM | POA: Diagnosis not present

## 2021-11-10 DIAGNOSIS — I480 Paroxysmal atrial fibrillation: Secondary | ICD-10-CM

## 2021-11-10 NOTE — Progress Notes (Signed)
Placed order for lab work for patient to have done tomorrow. CMET and Lipid.

## 2021-11-11 ENCOUNTER — Other Ambulatory Visit: Payer: Medicare Other

## 2021-11-11 ENCOUNTER — Other Ambulatory Visit: Payer: Self-pay

## 2021-11-11 ENCOUNTER — Ambulatory Visit
Admission: RE | Admit: 2021-11-11 | Discharge: 2021-11-11 | Disposition: A | Discharge status: Home / Self Care | Payer: Medicare Other | Source: Ambulatory Visit | Attending: Urology | Admitting: Urology

## 2021-11-11 DIAGNOSIS — D49512 Neoplasm of unspecified behavior of left kidney: Secondary | ICD-10-CM

## 2021-11-11 DIAGNOSIS — K449 Diaphragmatic hernia without obstruction or gangrene: Secondary | ICD-10-CM | POA: Diagnosis not present

## 2021-11-11 DIAGNOSIS — I480 Paroxysmal atrial fibrillation: Secondary | ICD-10-CM

## 2021-11-11 DIAGNOSIS — N2889 Other specified disorders of kidney and ureter: Secondary | ICD-10-CM | POA: Diagnosis not present

## 2021-11-11 DIAGNOSIS — E7800 Pure hypercholesterolemia, unspecified: Secondary | ICD-10-CM | POA: Diagnosis not present

## 2021-11-11 DIAGNOSIS — N281 Cyst of kidney, acquired: Secondary | ICD-10-CM | POA: Diagnosis not present

## 2021-11-11 DIAGNOSIS — K8689 Other specified diseases of pancreas: Secondary | ICD-10-CM | POA: Diagnosis not present

## 2021-11-11 LAB — COMPREHENSIVE METABOLIC PANEL
ALT: 26 IU/L (ref 0–32)
AST: 29 IU/L (ref 0–40)
Albumin/Globulin Ratio: 1.7 (ref 1.2–2.2)
Albumin: 4.3 g/dL (ref 3.6–4.6)
Alkaline Phosphatase: 128 IU/L — ABNORMAL HIGH (ref 44–121)
BUN/Creatinine Ratio: 20 (ref 12–28)
BUN: 23 mg/dL (ref 8–27)
Bilirubin Total: 0.4 mg/dL (ref 0.0–1.2)
CO2: 28 mmol/L (ref 20–29)
Calcium: 9.6 mg/dL (ref 8.7–10.3)
Chloride: 101 mmol/L (ref 96–106)
Creatinine, Ser: 1.16 mg/dL — ABNORMAL HIGH (ref 0.57–1.00)
Globulin, Total: 2.5 g/dL (ref 1.5–4.5)
Glucose: 94 mg/dL (ref 70–99)
Potassium: 4.5 mmol/L (ref 3.5–5.2)
Sodium: 142 mmol/L (ref 134–144)
Total Protein: 6.8 g/dL (ref 6.0–8.5)
eGFR: 46 mL/min/{1.73_m2} — ABNORMAL LOW (ref >59)

## 2021-11-11 LAB — LIPID PANEL
Chol/HDL Ratio: 2.6 ratio (ref 0.0–4.4)
Cholesterol, Total: 177 mg/dL (ref 100–199)
HDL: 67 mg/dL (ref >39)
LDL Chol Calc (NIH): 96 mg/dL (ref 0–99)
Triglycerides: 78 mg/dL (ref 0–149)
VLDL Cholesterol Cal: 14 mg/dL (ref 5–40)

## 2021-11-11 MED ORDER — GADOBENATE DIMEGLUMINE 529 MG/ML IV SOLN
13.0000 mL | Freq: Once | INTRAVENOUS | Status: AC | PRN
  Administered 2021-11-11: 13 mL via INTRAVENOUS

## 2021-11-12 ENCOUNTER — Other Ambulatory Visit: Payer: Self-pay | Admitting: Cardiovascular Disease

## 2021-11-24 DIAGNOSIS — D49512 Neoplasm of unspecified behavior of left kidney: Secondary | ICD-10-CM | POA: Diagnosis not present

## 2021-12-01 ENCOUNTER — Ambulatory Visit (INDEPENDENT_AMBULATORY_CARE_PROVIDER_SITE_OTHER): Payer: Medicare Other | Admitting: Cardiovascular Disease

## 2021-12-01 ENCOUNTER — Encounter: Payer: Self-pay | Admitting: Cardiovascular Disease

## 2021-12-01 ENCOUNTER — Other Ambulatory Visit: Payer: Self-pay

## 2021-12-01 VITALS — BP 110/70 | HR 57 | Ht 60.0 in | Wt 144.8 lb

## 2021-12-01 DIAGNOSIS — E782 Mixed hyperlipidemia: Secondary | ICD-10-CM | POA: Diagnosis not present

## 2021-12-01 DIAGNOSIS — I10 Essential (primary) hypertension: Secondary | ICD-10-CM | POA: Diagnosis not present

## 2021-12-01 DIAGNOSIS — I480 Paroxysmal atrial fibrillation: Secondary | ICD-10-CM

## 2021-12-01 DIAGNOSIS — R002 Palpitations: Secondary | ICD-10-CM

## 2021-12-01 MED ORDER — AMIODARONE HCL 100 MG PO TABS: 100.0000 mg | ORAL_TABLET | Freq: Every day | ORAL | 3 refills | Status: DC

## 2021-12-01 MED ORDER — POTASSIUM CHLORIDE CRYS ER 10 MEQ PO TBCR: EXTENDED_RELEASE_TABLET | ORAL | 3 refills | Status: DC

## 2021-12-01 NOTE — Progress Notes (Signed)
Cardiology Office Note:    Date:  12/01/2021   ID:  Elizabeth Everett, DOB July 21, 1932, MRN [id number]  PCP:  Street, Sharon Mt, MD   [facility] Providers Cardiologist:  Sherren Mocha, MD     Referring MD: Street, Sharon Mt, *   Chief Complaint  Patient presents with   Atrial Fibrillation    History of Present Illness:    Elizabeth Everett is a 88 y.o. female with a hx of paroxysmal atrial fibrillation and hypertension, presenting for follow-up evaluation.  The patient is here with her husband today. She continues to have episodic palpitations that are unpredictable.  They are not long-lasting.  She oftentimes goes a week with no symptoms at all, then will have nightly palpitations for several days.  She does some breathing exercises and relaxes and her symptoms resolved.  There has been no recent change in the pattern.  She has no other complaints.  She denies chest pain or shortness of breath.  She and her husband have some concerns about long-term amiodarone and we discussed that at length today.  Past Medical History:  Diagnosis Date   Acute respiratory failure with hypoxia and hypercapnia (HCC)    Arthritis    Atrial fibrillation (HCC)    Baker's cyst of knee    right    Bradycardia    Cancer (HCC)    skin cancer; endometrial cancer    Colonic pseudoobstruction    Dizziness    Dysrhythmia    afib    Edema of lower extremity    bilat    GERD (gastroesophageal reflux disease)    Hard of hearing    Heart murmur    History of atrial flutter    History of bronchitis    History of kidney stones    Hypercholesteremia    Hypertension    Macular degeneration    Multiple falls    Pancreatitis    Scoliosis    Shortness of breath dyspnea    can not climb full set of steps without stopping    Toxic metabolic encephalopathy     Past Surgical History:  Procedure Laterality Date   CARDIOVERSION     DILATION AND CURETTAGE OF UTERUS     FOOT SURGERY     bone  surgery   KNEE ARTHROSCOPY Right    left breast lumpectomy      benign    REVERSE SHOULDER ARTHROPLASTY Left 02/12/2021   Procedure: REVERSE SHOULDER ARTHROPLASTY;  Surgeon: Justice Britain, MD;  Location: [facility];  Service: Orthopedics;  Laterality: Left;   TONSILLECTOMY     TOTAL KNEE ARTHROPLASTY Right 12/01/2015   Procedure: RIGHT TOTAL KNEE ARTHROPLASTY;  Surgeon: Paralee Cancel, MD;  Location: [facility];  Service: Orthopedics;  Laterality: Right;   VAGINAL HYSTERECTOMY      Current Medications: Current Meds  Medication Sig   amiodarone (PACERONE) 100 MG tablet Take 1 tablet (100 mg total) by mouth daily.   cholecalciferol (VITAMIN D3) 25 MCG (1000 UNIT) tablet Take 1,000 Units by mouth daily.   ELIQUIS 5 MG TABS tablet TAKE 1 TABLET BY MOUTH TWICE A DAY   furosemide (LASIX) 20 MG tablet TAKE 1 TABLET BY MOUTH EVERY DAY   irbesartan (AVAPRO) 150 MG tablet TAKE 0.5 TABLETS (75 MG TOTAL) BY MOUTH EVERY EVENING.   levothyroxine (SYNTHROID) 50 MCG tablet Take 50 mcg by mouth daily before breakfast.   metoprolol tartrate (LOPRESSOR) 25 MG tablet TAKE 1 TABLET BY MOUTH TWICE A DAY  Multiple Vitamin (MULTIVITAMIN) capsule Take 1 capsule by mouth daily.   Multiple Vitamins-Minerals (EYE VITAMINS PO) Take 1 tablet by mouth in the morning and at bedtime.   pantoprazole (PROTONIX) 40 MG tablet TAKE 1 TABLET BY MOUTH EVERY DAY (Patient taking differently: Take 40 mg by mouth daily.)   simvastatin (ZOCOR) 20 MG tablet TAKE 1 TABLET BY MOUTH EVERY DAY (Patient taking differently: Take 20 mg by mouth daily.)   [DISCONTINUED] amiodarone (PACERONE) 200 MG tablet TAKE 1 TABLET BY MOUTH DAILY, MUST KEEP APPT IN FEBRUARY 2022 W/DR Amri Lien BEFORE ANYMORE REFILLS. (Patient taking differently: Take 200 mg by mouth daily.)   [DISCONTINUED] potassium chloride (KLOR-CON M10) 10 MEQ tablet TAKE 1 TABLET BY MOUTH EVERY OTHER DAY. Please keep upcoming appt in March 2023 with Dr. Burt Knack before anymore refills. Thank you      Allergies:   Evista [raloxifene], Morphine, Statins, Erythromycin, and Gentamicin   Social History   Socioeconomic History   Marital status: Married    Spouse name: Not on file   Number of children: Not on file   Years of education: Not on file   Highest education level: Not on file  Occupational History   Occupation: Retired  Tobacco Use   Smoking status: Never   Smokeless tobacco: Never  Vaping Use   Vaping Use: Never used  Substance and Sexual Activity   Alcohol use: Yes    Alcohol/week: 0.0 standard drinks    Comment: rare glass of wine    Drug use: No   Sexual activity: Not on file  Other Topics Concern   Not on file  Social History Narrative   Not on file   Social Determinants of Health   Financial Resource Strain: Not on file  Food Insecurity: Not on file  Transportation Needs: Not on file  Physical Activity: Not on file  Stress: Not on file  Social Connections: Not on file     Family History: [name] family history includes Lymphoma in her father; Macular degeneration in her father and mother.  ROS:   Please see the history of present illness.    All other systems reviewed and are negative.  EKGs/Labs/Other Studies Reviewed:    The following studies were reviewed today: 2D echocardiogram 01/04/2019:  1. The left ventricle has normal systolic function with an ejection  fraction of 60-65%. The cavity size was normal. There is moderately  increased left ventricular wall thickness. Left ventricular diastolic  parameters were normal.   2. The right ventricle has normal systolic function. The cavity was  normal. There is no increase in right ventricular wall thickness.   3. Left atrial size was severely dilated.   4. The mitral valve is normal in structure. There is mild mitral annular  calcification present.   5. The tricuspid valve is normal in structure.   6. The aortic valve is tricuspid Mild thickening of the aortic valve Mild  calcification  of the aortic valve.   7. The pulmonic valve was grossly normal. Pulmonic valve regurgitation is  mild by color flow Doppler.   Event monitor 07/16/2019: 1.  Basic rhythm is normal sinus with an average heart rate of 61 bpm 2.  There are short supraventricular runs up to 11 beats, but no long runs or sustained episodes of SVT 3.  Occasional PVCs 4.  There are no marked bradycardia arrhythmias or pathologic pauses greater than 3 seconds 5.  There is no evidence of atrial fibrillation or flutter  EKG:  EKG is ordered today.  The ekg ordered today demonstrates sinus bradycardia 57 bpm, within normal limits.  Recent Labs: 02/09/2021: Hemoglobin 10.5; Platelets 356 11/11/2021: ALT 26; BUN 23; Creatinine, Ser 1.16; Potassium 4.5; Sodium 142  Recent Lipid Panel    Component Value Date/Time   CHOL 177 11/11/2021 1242   TRIG 78 11/11/2021 1242   HDL 67 11/11/2021 1242   CHOLHDL 2.6 11/11/2021 1242   LDLCALC 96 11/11/2021 1242     Risk Assessment/Calculations:    CHA2DS2-VASc Score = 4   This indicates a 4.8% annual risk of stroke. [name] score is based upon: CHF History: 0 HTN History: 1 Diabetes History: 0 Stroke History: 0 Vascular Disease History: 0 Age Score: 2 Gender Score: 1          Physical Exam:    VS:  BP 110/70    Pulse (!) 57    Ht 5' (1.524 m)    Wt 144 lb 12.8 oz (65.7 kg)    SpO2 94%    BMI 28.28 kg/m     Wt Readings from Last 3 Encounters:  12/01/21 144 lb 12.8 oz (65.7 kg)  02/12/21 144 lb 6.4 oz (65.5 kg)  12/28/20 149 lb 6.4 oz (67.8 kg)     GEN:  Well nourished, well developed in no acute distress HEENT: Normal NECK: No JVD; No carotid bruits LYMPHATICS: No lymphadenopathy CARDIAC: RRR, 2/6 systolic ejection murmur at the right upper sternal border RESPIRATORY:  Clear to auscultation without rales, wheezing or rhonchi  ABDOMEN: Soft, non-tender, non-distended MUSCULOSKELETAL:  No edema; No deformity  SKIN: Warm and dry NEUROLOGIC:  Alert  and oriented x 3 PSYCHIATRIC:  Normal affect   ASSESSMENT:    1. PAF (paroxysmal atrial fibrillation) (HCC)   2. Palpitations   3. Mixed hyperlipidemia   4. Essential hypertension    PLAN:    In order of problems listed above:  Concerns noted about long-term amiodarone use.  I think it is reasonable to reduce her dosage to 100 mg daily.  I will see her back to reassess in 6 months.  LFTs are within normal limits.  Thyroid function is monitored by her PCP.  We will repeat labs in 6 months when she returns.  She is tolerating oral anticoagulation with apixaban. Stable pattern noted.  Continue metoprolol. Treated with simvastatin.  Most recent labs reviewed.  LFTs within normal limits.  LDL cholesterol is 96 mg/dL.  Patient with no history of CAD. Blood pressure well controlled on irbesartan and metoprolol tartrate.           Medication Adjustments/Labs and Tests Ordered: Current medicines are reviewed at length with the patient today.  Concerns regarding medicines are outlined above.  Orders Placed This Encounter  Procedures   Comp Met (CMET)   EKG 12-Lead   Meds ordered this encounter  Medications   potassium chloride (KLOR-CON M10) 10 MEQ tablet    Sig: TAKE 1 TABLET BY MOUTH EVERY OTHER DAY. Please keep upcoming appt in March 2023 with Dr. Burt Knack before anymore refills. Thank you    Dispense:  90 tablet    Refill:  3    Pt must keep upcoming appt in March 2023 with Dr. Burt Knack before anymore refills. Thank you   amiodarone (PACERONE) 100 MG tablet    Sig: Take 1 tablet (100 mg total) by mouth daily.    Dispense:  90 tablet    Refill:  3    Patient Instructions  Medication Instructions:  DECREASE Amiodarone (Pacerone) to 124m once daily *If you need a refill on your cardiac medications before your next appointment, please call your pharmacy*   Lab Work: CMET in 6 months (same day appt) If you have labs (blood work) drawn today and your tests are completely normal,  you will receive your results only by: [place](if you have MyChart) OR A paper copy in the mail If you have any lab test that is abnormal or we need to change your treatment, we will call you to review the results.   Testing/Procedures: NONE   Follow-Up: [facility] and your [facility] needs [facility] our [facility].  As part of our continuing mission to provide you with exceptional heart care, we have created designated Provider Care Teams.  These Care Teams include your primary Cardiologist (physician) and Advanced Practice Providers (APPs -  Physician Assistants and Nurse Practitioners) who all work together to provide you with the care you need, when you need it.   Your next appointment:   6 month(s)  The format for your next appointment:   In Person  Provider:   MSherren Mocha MD     Other Instructions Thank you for allowing myself and [place] serve you, God Bless!!     Signed, MSherren Mocha MD  12/01/2021 4:27 PM    [place]

## 2021-12-01 NOTE — Patient Instructions (Signed)
Medication Instructions:  DECREASE Amiodarone (Pacerone) to 100mg  once daily *If you need a refill on your cardiac medications before your next appointment, please call your pharmacy*   Lab Work: CMET in 6 months (same day appt) If you have labs (blood work) drawn today and your tests are completely normal, you will receive your results only by: [place] (if you have MyChart) OR A paper copy in the mail If you have any lab test that is abnormal or we need to change your treatment, we will call you to review the results.   Testing/Procedures: NONE   Follow-Up: At [facility], you and your health needs are our priority.  As part of our continuing mission to provide you with exceptional heart care, we have created designated Provider Care Teams.  These Care Teams include your primary Cardiologist (physician) and Advanced Practice Providers (APPs -  Physician Assistants and Nurse Practitioners) who all work together to provide you with the care you need, when you need it.   Your next appointment:   6 month(s)  The format for your next appointment:   In Person  Provider:   Sherren Mocha, MD     Other Instructions Thank you for allowing myself and [place] to serve you, God Bless!!

## 2022-01-09 DIAGNOSIS — W19XXXA Unspecified fall, initial encounter: Secondary | ICD-10-CM | POA: Diagnosis not present

## 2022-01-09 DIAGNOSIS — Z23 Encounter for immunization: Secondary | ICD-10-CM | POA: Diagnosis not present

## 2022-01-09 DIAGNOSIS — S5002XA Contusion of left elbow, initial encounter: Secondary | ICD-10-CM | POA: Diagnosis not present

## 2022-01-09 DIAGNOSIS — S0181XA Laceration without foreign body of other part of head, initial encounter: Secondary | ICD-10-CM | POA: Diagnosis not present

## 2022-01-09 DIAGNOSIS — Z79899 Other long term (current) drug therapy: Secondary | ICD-10-CM | POA: Diagnosis not present

## 2022-01-09 DIAGNOSIS — M85822 Other specified disorders of bone density and structure, left upper arm: Secondary | ICD-10-CM | POA: Diagnosis not present

## 2022-01-09 DIAGNOSIS — S8992XA Unspecified injury of left lower leg, initial encounter: Secondary | ICD-10-CM | POA: Diagnosis not present

## 2022-01-09 DIAGNOSIS — R531 Weakness: Secondary | ICD-10-CM | POA: Diagnosis not present

## 2022-01-09 DIAGNOSIS — S80212A Abrasion, left knee, initial encounter: Secondary | ICD-10-CM | POA: Diagnosis not present

## 2022-01-09 DIAGNOSIS — S59902A Unspecified injury of left elbow, initial encounter: Secondary | ICD-10-CM | POA: Diagnosis not present

## 2022-01-09 DIAGNOSIS — Z043 Encounter for examination and observation following other accident: Secondary | ICD-10-CM | POA: Diagnosis not present

## 2022-01-09 DIAGNOSIS — M19012 Primary osteoarthritis, left shoulder: Secondary | ICD-10-CM | POA: Diagnosis not present

## 2022-01-10 DIAGNOSIS — M85822 Other specified disorders of bone density and structure, left upper arm: Secondary | ICD-10-CM | POA: Diagnosis not present

## 2022-01-11 ENCOUNTER — Other Ambulatory Visit: Payer: Self-pay | Admitting: Cardiovascular Disease

## 2022-01-11 DIAGNOSIS — R002 Palpitations: Secondary | ICD-10-CM

## 2022-01-11 DIAGNOSIS — I480 Paroxysmal atrial fibrillation: Secondary | ICD-10-CM

## 2022-01-28 DIAGNOSIS — R310 Gross hematuria: Secondary | ICD-10-CM | POA: Diagnosis not present

## 2022-02-01 ENCOUNTER — Other Ambulatory Visit: Payer: Self-pay | Admitting: Family Medicine

## 2022-02-01 DIAGNOSIS — E785 Hyperlipidemia, unspecified: Secondary | ICD-10-CM | POA: Diagnosis not present

## 2022-02-01 DIAGNOSIS — I4892 Unspecified atrial flutter: Secondary | ICD-10-CM | POA: Diagnosis not present

## 2022-02-01 DIAGNOSIS — D6869 Other thrombophilia: Secondary | ICD-10-CM | POA: Diagnosis not present

## 2022-02-01 DIAGNOSIS — Z7901 Long term (current) use of anticoagulants: Secondary | ICD-10-CM | POA: Diagnosis not present

## 2022-02-01 DIAGNOSIS — N1831 Chronic kidney disease, stage 3a: Secondary | ICD-10-CM | POA: Diagnosis not present

## 2022-02-01 DIAGNOSIS — Z1231 Encounter for screening mammogram for malignant neoplasm of breast: Secondary | ICD-10-CM

## 2022-02-01 DIAGNOSIS — R7302 Impaired glucose tolerance (oral): Secondary | ICD-10-CM | POA: Diagnosis not present

## 2022-02-01 DIAGNOSIS — Z79899 Other long term (current) drug therapy: Secondary | ICD-10-CM | POA: Diagnosis not present

## 2022-02-01 DIAGNOSIS — Z0000 Encounter for general adult medical examination without abnormal findings: Secondary | ICD-10-CM | POA: Diagnosis not present

## 2022-02-04 ENCOUNTER — Ambulatory Visit
Admission: RE | Admit: 2022-02-04 | Discharge: 2022-02-04 | Disposition: A | Discharge status: Home / Self Care | Payer: Medicare Other | Source: Ambulatory Visit | Attending: Family Medicine | Admitting: Family Medicine

## 2022-02-04 DIAGNOSIS — Z1231 Encounter for screening mammogram for malignant neoplasm of breast: Secondary | ICD-10-CM | POA: Diagnosis not present

## 2022-02-05 ENCOUNTER — Other Ambulatory Visit: Payer: Self-pay | Admitting: Cardiovascular Disease

## 2022-02-05 DIAGNOSIS — I480 Paroxysmal atrial fibrillation: Secondary | ICD-10-CM

## 2022-02-07 NOTE — Telephone Encounter (Signed)
Prescription refill request for Eliquis received. ?Indication: Afib  ?Last office visit: 12/01/21 Elizabeth Everett) ?Scr: 1.16 (11/11/21) ?Age: 88 ?Weight: 65.7kg ? ?Appropriate dose and refill sent to requested pharmacy. ?

## 2022-02-21 ENCOUNTER — Other Ambulatory Visit: Payer: Self-pay | Admitting: Cardiovascular Disease

## 2022-02-22 ENCOUNTER — Other Ambulatory Visit: Payer: Self-pay | Admitting: Cardiovascular Disease

## 2022-05-13 DIAGNOSIS — I4892 Unspecified atrial flutter: Secondary | ICD-10-CM | POA: Diagnosis not present

## 2022-05-13 DIAGNOSIS — D6869 Other thrombophilia: Secondary | ICD-10-CM | POA: Diagnosis not present

## 2022-05-13 DIAGNOSIS — Z6828 Body mass index (BMI) 28.0-28.9, adult: Secondary | ICD-10-CM | POA: Diagnosis not present

## 2022-05-13 DIAGNOSIS — L858 Other specified epidermal thickening: Secondary | ICD-10-CM | POA: Diagnosis not present

## 2022-06-20 ENCOUNTER — Ambulatory Visit (INDEPENDENT_AMBULATORY_CARE_PROVIDER_SITE_OTHER): Payer: Medicare Other | Admitting: Cardiovascular Disease

## 2022-06-20 ENCOUNTER — Encounter: Payer: Self-pay | Admitting: Cardiovascular Disease

## 2022-06-20 VITALS — BP 138/60 | HR 59 | Ht 60.0 in | Wt 146.2 lb

## 2022-06-20 DIAGNOSIS — I10 Essential (primary) hypertension: Secondary | ICD-10-CM | POA: Diagnosis not present

## 2022-06-20 DIAGNOSIS — E782 Mixed hyperlipidemia: Secondary | ICD-10-CM

## 2022-06-20 DIAGNOSIS — I480 Paroxysmal atrial fibrillation: Secondary | ICD-10-CM | POA: Diagnosis not present

## 2022-06-20 NOTE — Progress Notes (Signed)
Cardiology Office Note:    Date:  06/20/2022   ID:  Elizabeth Everett, DOB 08-04-1932, MRN [id number]  PCP:  Street, Sharon Mt, MD   [place] Providers Cardiologist:  Sherren Mocha, MD     Referring MD: Street, Sharon Mt, *   Chief Complaint  Patient presents with   Atrial Fibrillation    History of Present Illness:    Elizabeth Everett is a 88 y.o. female with a hx of paroxysmal atrial fibrillation and hypertension, presenting for follow-up evaluation.  The patient is here with her husband today.  She had some heart palpitations about 3 days ago that occurred at nighttime.  This is generally when she experiences this.  She reports 2 different kinds of heart palpitations.  1 type she is able to control with coughing and Valsalva maneuvers when they occur.  The other type she states that she just does some deep breathing and may resolve on their own.  Symptoms do not generally last more than a minute.  She has not had any recent increase in the severity or incidence of her heart palpitations.  She has had no chest pain, chest pressure, or shortness of breath.  She has not noticed any increase in her heart palpitations since reducing the amiodarone from 200 mg daily down to 100 mg daily (medication change made in February 2023).  Past Medical History:  Diagnosis Date   Acute respiratory failure with hypoxia and hypercapnia (HCC)    Arthritis    Atrial fibrillation (HCC)    Baker's cyst of knee    right    Bradycardia    Cancer (HCC)    skin cancer; endometrial cancer    Colonic pseudoobstruction    Dizziness    Dysrhythmia    afib    Edema of lower extremity    bilat    GERD (gastroesophageal reflux disease)    Hard of hearing    Heart murmur    History of atrial flutter    History of bronchitis    History of kidney stones    Hypercholesteremia    Hypertension    Macular degeneration    Multiple falls    Pancreatitis    Scoliosis    Shortness of breath  dyspnea    can not climb full set of steps without stopping    Toxic metabolic encephalopathy     Past Surgical History:  Procedure Laterality Date   CARDIOVERSION     DILATION AND CURETTAGE OF UTERUS     FOOT SURGERY     bone surgery   KNEE ARTHROSCOPY Right    left breast lumpectomy      benign    REVERSE SHOULDER ARTHROPLASTY Left 02/12/2021   Procedure: REVERSE SHOULDER ARTHROPLASTY;  Surgeon: Justice Britain, MD;  Location: [facility];  Service: Orthopedics;  Laterality: Left;   TONSILLECTOMY     TOTAL KNEE ARTHROPLASTY Right 12/01/2015   Procedure: RIGHT TOTAL KNEE ARTHROPLASTY;  Surgeon: Paralee Cancel, MD;  Location: [facility];  Service: Orthopedics;  Laterality: Right;   VAGINAL HYSTERECTOMY      Current Medications: Current Meds  Medication Sig   amiodarone (PACERONE) 100 MG tablet Take 1 tablet (100 mg total) by mouth daily.   cholecalciferol (VITAMIN D3) 25 MCG (1000 UNIT) tablet Take 1,000 Units by mouth daily.   ELIQUIS 5 MG TABS tablet TAKE 1 TABLET BY MOUTH TWICE A DAY   furosemide (LASIX) 20 MG tablet TAKE 1 TABLET BY MOUTH EVERY DAY  irbesartan (AVAPRO) 150 MG tablet TAKE 1/2 TABLETS BY MOUTH EVERY EVENING.   levothyroxine (SYNTHROID) 50 MCG tablet Take 50 mcg by mouth daily before breakfast.   metoprolol tartrate (LOPRESSOR) 25 MG tablet Take 1 tablet (25 mg total) by mouth 2 (two) times daily.   Multiple Vitamin (MULTIVITAMIN) capsule Take 1 capsule by mouth daily.   Multiple Vitamins-Minerals (EYE VITAMINS PO) Take 1 tablet by mouth in the morning and at bedtime.   pantoprazole (PROTONIX) 40 MG tablet Take 1 tablet (40 mg total) by mouth daily.   potassium chloride (KLOR-CON M10) 10 MEQ tablet TAKE 1 TABLET BY MOUTH EVERY OTHER DAY. PLEASE KEEP MARCH 2023 WITH DR Burt Knack BEFORE ANYMORE REFILLS   simvastatin (ZOCOR) 20 MG tablet Take 1 tablet (20 mg total) by mouth daily.     Allergies:   Evista [raloxifene], Morphine, Statins, Erythromycin, and Gentamicin   Social  History   Socioeconomic History   Marital status: Married    Spouse name: Not on file   Number of children: Not on file   Years of education: Not on file   Highest education level: Not on file  Occupational History   Occupation: Retired  Tobacco Use   Smoking status: Never   Smokeless tobacco: Never  Vaping Use   Vaping Use: Never used  Substance and Sexual Activity   Alcohol use: Yes    Alcohol/week: 0.0 standard drinks of alcohol    Comment: rare glass of wine    Drug use: No   Sexual activity: Not on file  Other Topics Concern   Not on file  Social History Narrative   Not on file   Social Determinants of Health   Financial Resource Strain: Not on file  Food Insecurity: Not on file  Transportation Needs: Not on file  Physical Activity: Not on file  Stress: Not on file  Social Connections: Not on file     Family History: [name] family history includes Lymphoma in her father; Macular degeneration in her father and mother.  ROS:   Please see the history of present illness.    All other systems reviewed and are negative.  EKGs/Labs/Other Studies Reviewed:    EKG:  EKG is not ordered today.  Recent Labs: 11/11/2021: ALT 26; BUN 23; Creatinine, Ser 1.16; Potassium 4.5; Sodium 142  Recent Lipid Panel    Component Value Date/Time   CHOL 177 11/11/2021 1242   TRIG 78 11/11/2021 1242   HDL 67 11/11/2021 1242   CHOLHDL 2.6 11/11/2021 1242   LDLCALC 96 11/11/2021 1242     Risk Assessment/Calculations:    CHA2DS2-VASc Score = 4   This indicates a 4.8% annual risk of stroke. [name] score is based upon: CHF History: 0 HTN History: 1 Diabetes History: 0 Stroke History: 0 Vascular Disease History: 0 Age Score: 2 Gender Score: 1               Physical Exam:    VS:  BP 138/60   Pulse (!) 59   Ht 5' (1.524 m)   Wt 146 lb 3.2 oz (66.3 kg)   SpO2 96%   BMI 28.55 kg/m     Wt Readings from Last 3 Encounters:  06/20/22 146 lb 3.2 oz  (66.3 kg)  12/01/21 144 lb 12.8 oz (65.7 kg)  02/12/21 144 lb 6.4 oz (65.5 kg)     GEN:  Well nourished, well developed pleasant elderly woman in no acute distress HEENT: Normal NECK: No JVD; No carotid  bruits LYMPHATICS: No lymphadenopathy CARDIAC: RRR, no murmurs, rubs, gallops RESPIRATORY:  Clear to auscultation without rales, wheezing or rhonchi  ABDOMEN: Soft, non-tender, non-distended MUSCULOSKELETAL:  No edema; No deformity  SKIN: Warm and dry NEUROLOGIC:  Alert and oriented x 3 PSYCHIATRIC:  Normal affect   ASSESSMENT:    1. PAF (paroxysmal atrial fibrillation) ([place])   2. Essential hypertension   3. Mixed hyperlipidemia    PLAN:    In order of problems listed above:  Appears stable.  She continues on amiodarone 100 mg daily.  Labs have been followed by her PCP.  If necessary, will update TSH and LFTs when she returns in 6 months.  No medication changes are recommended today.  Advised her that if she is having increased frequency of palpitations at a certain time, she can take an additional metoprolol tartrate 25 mg as needed.  She is tolerating apixaban without any bleeding problems. Blood pressure is well controlled on irbesartan and metoprolol Treated with low-dose simvastatin.  Last cholesterol is 160, HDL 54, LDL 96.           Medication Adjustments/Labs and Tests Ordered: Current medicines are reviewed at length with the patient today.  Concerns regarding medicines are outlined above.  No orders of the defined types were placed in this encounter.  No orders of the defined types were placed in this encounter.   Patient Instructions  Medication Instructions:  Your physician has recommended you make the following change in your medication:  Metoprolol 25 mg 2 times daily. May take an extra dose for palpitations as needed.  *If you need a refill on your cardiac medications before your next appointment, please call your pharmacy*   Follow-Up: At [facility], you and your health needs are our priority.  As part of our continuing mission to provide you with exceptional heart care, we have created designated Provider Care Teams.  These Care Teams include your primary Cardiologist (physician) and Advanced Practice Providers (APPs -  Physician Assistants and Nurse Practitioners) who all work together to provide you with the care you need, when you need it.   Your next appointment:   6 month(s)  The format for your next appointment:   In Person  Provider:   Sherren Mocha, MD  Please schedule appointment to with husbands appointment on same day.        Signed, Sherren Mocha, MD  06/20/2022 1:38 PM    [place]

## 2022-06-20 NOTE — Patient Instructions (Signed)
Medication Instructions:  Your physician has recommended you make the following change in your medication:  Metoprolol 25 mg 2 times daily. May take an extra dose for palpitations as needed.  *If you need a refill on your cardiac medications before your next appointment, please call your pharmacy*   Follow-Up: At [facility], you and your health needs are our priority.  As part of our continuing mission to provide you with exceptional heart care, we have created designated Provider Care Teams.  These Care Teams include your primary Cardiologist (physician) and Advanced Practice Providers (APPs -  Physician Assistants and Nurse Practitioners) who all work together to provide you with the care you need, when you need it.   Your next appointment:   6 month(s)  The format for your next appointment:   In Person  Provider:   Sherren Mocha, MD  Please schedule appointment to with husbands appointment on same day.

## 2022-07-06 DIAGNOSIS — L853 Xerosis cutis: Secondary | ICD-10-CM | POA: Diagnosis not present

## 2022-07-06 DIAGNOSIS — D485 Neoplasm of uncertain behavior of skin: Secondary | ICD-10-CM | POA: Diagnosis not present

## 2022-07-06 DIAGNOSIS — Z85828 Personal history of other malignant neoplasm of skin: Secondary | ICD-10-CM | POA: Diagnosis not present

## 2022-07-06 DIAGNOSIS — C44329 Squamous cell carcinoma of skin of other parts of face: Secondary | ICD-10-CM | POA: Diagnosis not present

## 2022-07-12 DIAGNOSIS — H43813 Vitreous degeneration, bilateral: Secondary | ICD-10-CM | POA: Diagnosis not present

## 2022-07-12 DIAGNOSIS — H353232 Exudative age-related macular degeneration, bilateral, with inactive choroidal neovascularization: Secondary | ICD-10-CM | POA: Diagnosis not present

## 2022-07-20 DIAGNOSIS — Z85828 Personal history of other malignant neoplasm of skin: Secondary | ICD-10-CM | POA: Diagnosis not present

## 2022-07-20 DIAGNOSIS — L814 Other melanin hyperpigmentation: Secondary | ICD-10-CM | POA: Diagnosis not present

## 2022-07-20 DIAGNOSIS — L570 Actinic keratosis: Secondary | ICD-10-CM | POA: Diagnosis not present

## 2022-07-20 DIAGNOSIS — Z85820 Personal history of malignant melanoma of skin: Secondary | ICD-10-CM | POA: Diagnosis not present

## 2022-07-20 DIAGNOSIS — Z08 Encounter for follow-up examination after completed treatment for malignant neoplasm: Secondary | ICD-10-CM | POA: Diagnosis not present

## 2022-07-20 DIAGNOSIS — D2239 Melanocytic nevi of other parts of face: Secondary | ICD-10-CM | POA: Diagnosis not present

## 2022-07-20 DIAGNOSIS — Z7189 Other specified counseling: Secondary | ICD-10-CM | POA: Diagnosis not present

## 2022-07-20 DIAGNOSIS — C44329 Squamous cell carcinoma of skin of other parts of face: Secondary | ICD-10-CM | POA: Diagnosis not present

## 2022-07-20 DIAGNOSIS — L821 Other seborrheic keratosis: Secondary | ICD-10-CM | POA: Diagnosis not present

## 2022-08-05 DIAGNOSIS — D692 Other nonthrombocytopenic purpura: Secondary | ICD-10-CM | POA: Diagnosis not present

## 2022-08-05 DIAGNOSIS — Z85828 Personal history of other malignant neoplasm of skin: Secondary | ICD-10-CM | POA: Diagnosis not present

## 2022-08-05 DIAGNOSIS — D1801 Hemangioma of skin and subcutaneous tissue: Secondary | ICD-10-CM | POA: Diagnosis not present

## 2022-08-05 DIAGNOSIS — Z85820 Personal history of malignant melanoma of skin: Secondary | ICD-10-CM | POA: Diagnosis not present

## 2022-08-05 DIAGNOSIS — L570 Actinic keratosis: Secondary | ICD-10-CM | POA: Diagnosis not present

## 2022-08-05 DIAGNOSIS — L821 Other seborrheic keratosis: Secondary | ICD-10-CM | POA: Diagnosis not present

## 2022-08-05 DIAGNOSIS — E663 Overweight: Secondary | ICD-10-CM | POA: Diagnosis not present

## 2022-08-05 DIAGNOSIS — L578 Other skin changes due to chronic exposure to nonionizing radiation: Secondary | ICD-10-CM | POA: Diagnosis not present

## 2022-08-10 DIAGNOSIS — L929 Granulomatous disorder of the skin and subcutaneous tissue, unspecified: Secondary | ICD-10-CM | POA: Diagnosis not present

## 2022-08-10 DIAGNOSIS — D485 Neoplasm of uncertain behavior of skin: Secondary | ICD-10-CM | POA: Diagnosis not present

## 2022-08-11 ENCOUNTER — Other Ambulatory Visit: Payer: Self-pay | Admitting: Cardiovascular Disease

## 2022-08-11 DIAGNOSIS — I480 Paroxysmal atrial fibrillation: Secondary | ICD-10-CM

## 2022-08-11 NOTE — Telephone Encounter (Signed)
Eliquis '5mg'$  refill request received. Patient is 88 years old, weight-66.3kg, Crea-1.16 on 11/11/2021, Diagnosis-Afib, and last seen by Dr. Burt Knack on 06/20/2022. Dose is appropriate based on dosing criteria. Will send in refill to requested pharmacy.

## 2022-08-26 DIAGNOSIS — Z23 Encounter for immunization: Secondary | ICD-10-CM | POA: Diagnosis not present

## 2022-10-23 IMAGING — CR DG CHEST 2V
2 series · 2 of 2 positions shown · non-contrast
Comparison: Chest x-ray 07/04/2018.

CLINICAL DATA: 88-year-old female with history of amiodarone use.

EXAM:
CHEST - 2 VIEW

[w chest pa]
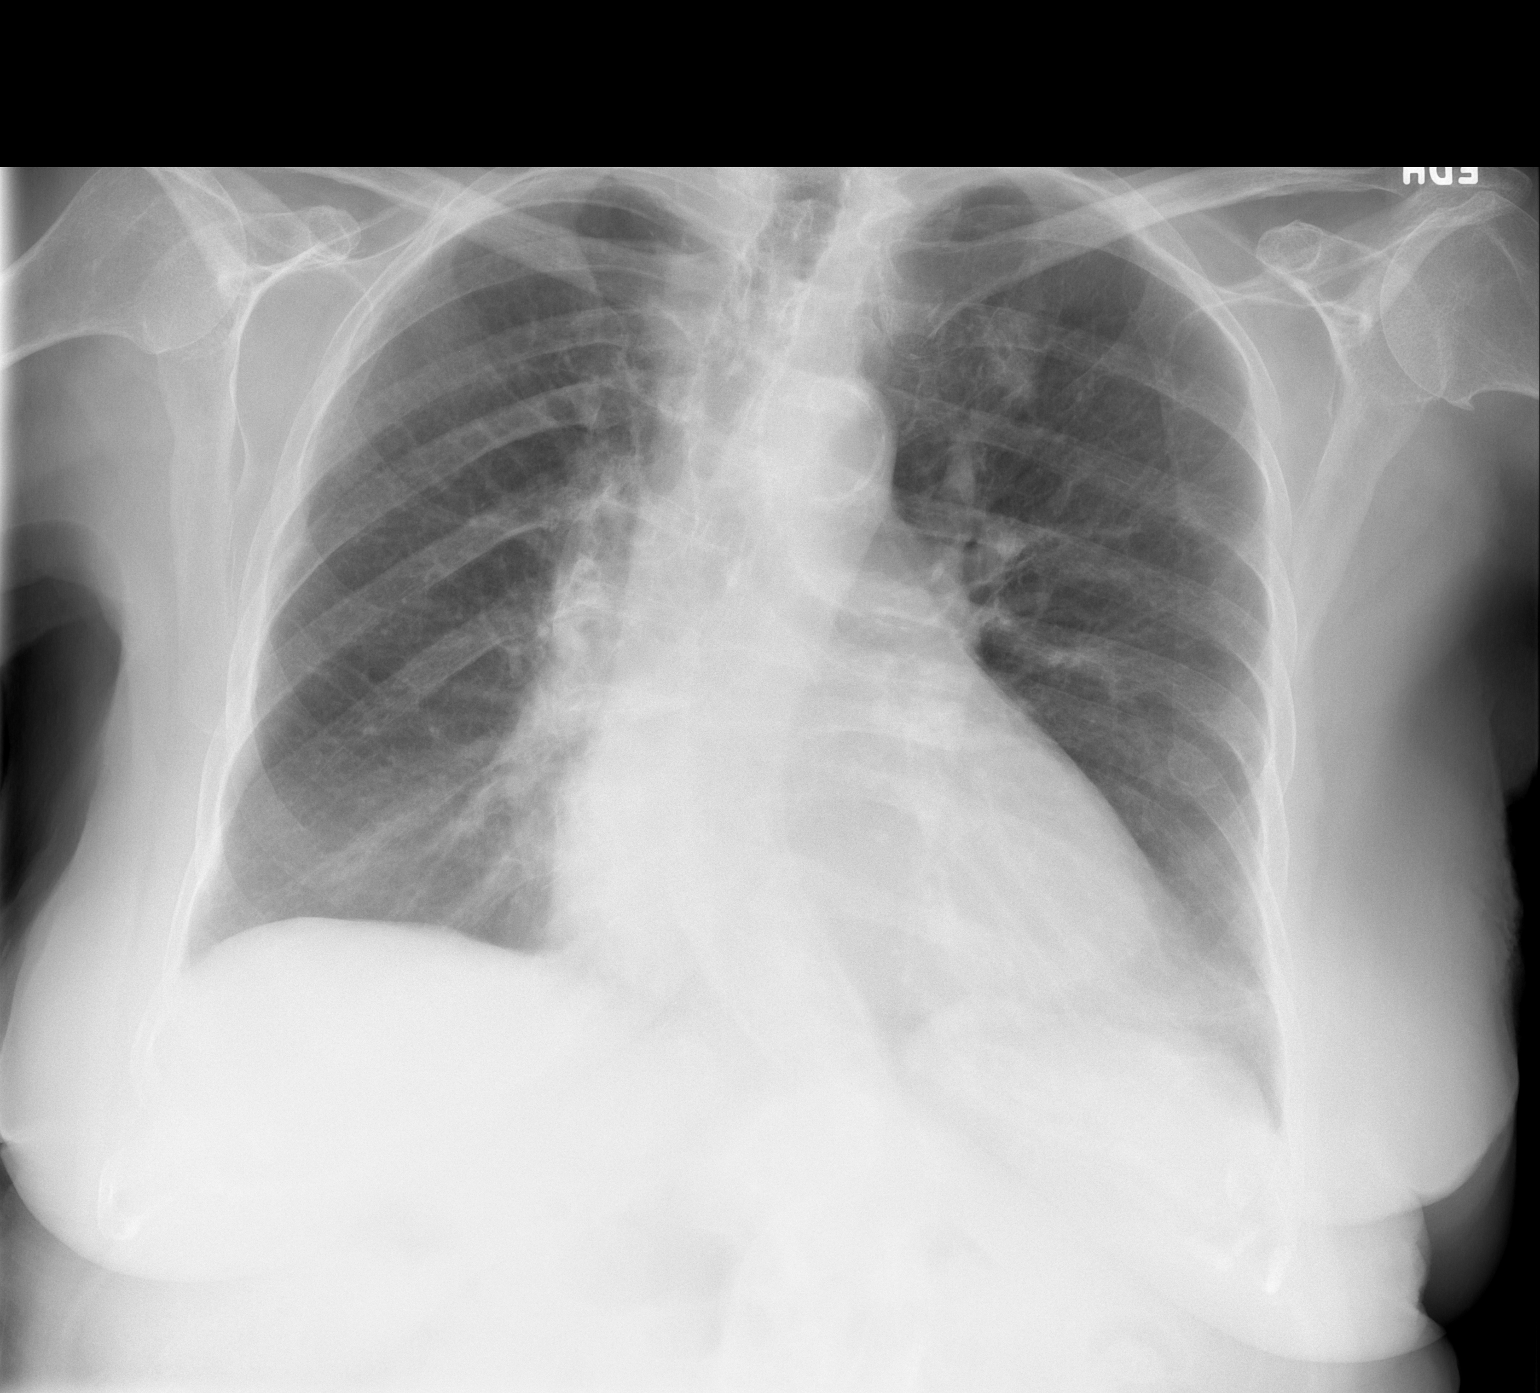

[w chest lat]
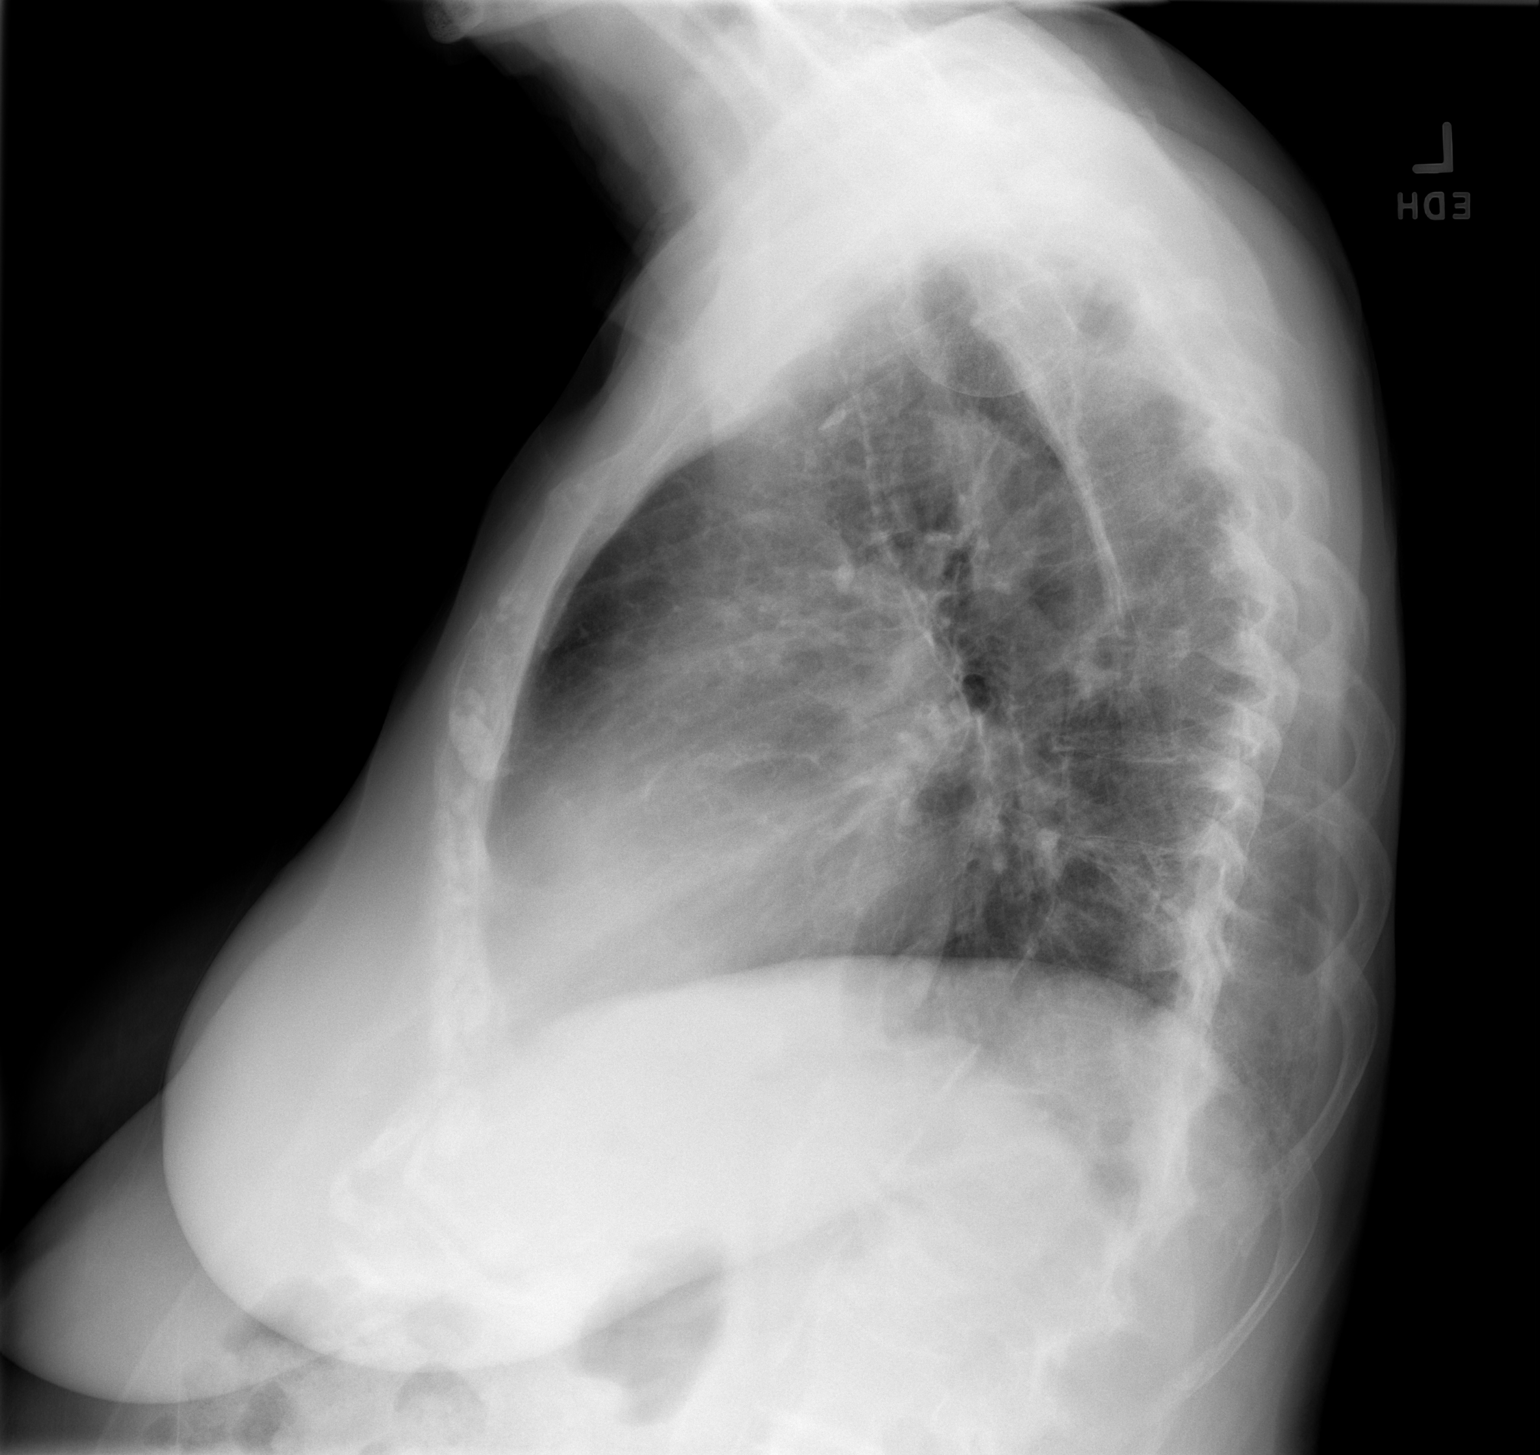

[2 of 2 positions shown; findings below may reference images not displayed]

FINDINGS: Lung volumes are low. No consolidative airspace disease. No pleural
effusions. No pneumothorax. No pulmonary nodule or mass noted. No
evidence of pulmonary edema. Heart size is borderline enlarged.
Upper mediastinal contours are within normal limits. Aortic
atherosclerosis.
IMPRESSION: 1. Low lung volumes without radiographic evidence of acute
cardiopulmonary disease.
2. Aortic atherosclerosis.

## 2022-11-01 DIAGNOSIS — L03115 Cellulitis of right lower limb: Secondary | ICD-10-CM | POA: Diagnosis not present

## 2022-11-09 ENCOUNTER — Other Ambulatory Visit: Payer: Self-pay | Admitting: Cardiovascular Disease

## 2022-11-09 DIAGNOSIS — I480 Paroxysmal atrial fibrillation: Secondary | ICD-10-CM

## 2022-11-09 DIAGNOSIS — R002 Palpitations: Secondary | ICD-10-CM

## 2022-11-18 DIAGNOSIS — D49512 Neoplasm of unspecified behavior of left kidney: Secondary | ICD-10-CM | POA: Diagnosis not present

## 2022-12-09 ENCOUNTER — Other Ambulatory Visit: Payer: Self-pay | Admitting: Cardiovascular Disease

## 2022-12-09 DIAGNOSIS — R002 Palpitations: Secondary | ICD-10-CM

## 2022-12-09 DIAGNOSIS — C44722 Squamous cell carcinoma of skin of right lower limb, including hip: Secondary | ICD-10-CM | POA: Diagnosis not present

## 2022-12-09 DIAGNOSIS — D485 Neoplasm of uncertain behavior of skin: Secondary | ICD-10-CM | POA: Diagnosis not present

## 2022-12-09 DIAGNOSIS — L570 Actinic keratosis: Secondary | ICD-10-CM | POA: Diagnosis not present

## 2022-12-09 DIAGNOSIS — I480 Paroxysmal atrial fibrillation: Secondary | ICD-10-CM

## 2022-12-20 ENCOUNTER — Encounter: Payer: Self-pay | Admitting: Cardiovascular Disease

## 2022-12-20 ENCOUNTER — Ambulatory Visit
Admission: RE | Admit: 2022-12-20 | Discharge: 2022-12-20 | Disposition: A | Discharge status: Home / Self Care | Payer: Medicare Other | Source: Ambulatory Visit | Attending: Cardiovascular Disease | Admitting: Cardiovascular Disease

## 2022-12-20 ENCOUNTER — Ambulatory Visit: Payer: Medicare Other | Attending: Cardiovascular Disease | Admitting: Cardiovascular Disease

## 2022-12-20 VITALS — BP 130/80 | HR 62 | Ht 60.0 in | Wt 146.0 lb

## 2022-12-20 DIAGNOSIS — I480 Paroxysmal atrial fibrillation: Secondary | ICD-10-CM | POA: Insufficient documentation

## 2022-12-20 DIAGNOSIS — Z79899 Other long term (current) drug therapy: Secondary | ICD-10-CM | POA: Diagnosis not present

## 2022-12-20 DIAGNOSIS — I10 Essential (primary) hypertension: Secondary | ICD-10-CM | POA: Insufficient documentation

## 2022-12-20 DIAGNOSIS — E782 Mixed hyperlipidemia: Secondary | ICD-10-CM | POA: Diagnosis not present

## 2022-12-20 DIAGNOSIS — R0602 Shortness of breath: Secondary | ICD-10-CM

## 2022-12-20 LAB — CBC

## 2022-12-20 NOTE — Patient Instructions (Signed)
Medication Instructions:  Your physician recommends that you continue on your current medications as directed. Please refer to the Current Medication list given to you today.  *If you need a refill on your cardiac medications before your next appointment, please call your pharmacy*   Lab Work: TSH, Free T4, CMET, CBC If you have labs (blood work) drawn today and your tests are completely normal, you will receive your results only by: [place] (if you have MyChart) OR A paper copy in the mail If you have any lab test that is abnormal or we need to change your treatment, we will call you to review the results.   Testing/Procedures: Chest x-ray A chest x-ray takes a picture of the organs and structures inside the chest, including the heart, lungs, and blood vessels. This test can show several things, including, whether the heart is enlarges; whether fluid is building up in the lungs; and whether pacemaker / defibrillator leads are still in place.  Follow-Up: At [facility], you and your health needs are our priority.  As part of our continuing mission to provide you with exceptional heart care, we have created designated Provider Care Teams.  These Care Teams include your primary Cardiologist (physician) and Advanced Practice Providers (APPs -  Physician Assistants and Nurse Practitioners) who all work together to provide you with the care you need, when you need it.  Your next appointment:   1 year(s)  Provider:   Sherren Mocha, MD

## 2022-12-20 NOTE — Progress Notes (Signed)
Cardiology Office Note:    Date:  12/20/2022   ID:  Elizabeth Everett, DOB [birth year], MRN [id number]  PCP:  Street, Sharon Mt, MD   [facility] Providers Cardiologist:  Sherren Mocha, MD     Referring MD: Street, Sharon Mt, *   Chief Complaint  Patient presents with   Atrial Fibrillation    History of Present Illness:    Elizabeth Everett is a 89 y.o. female presenting for follow-up of paroxysmal atrial fibrillation and hypertension.  She is here with her husband today.  She has been doing well and has had no heart palpitations until the past few weeks when she reports a few brief episodes.  Overall she has not been bothered by this and states that her palpitations are less bothersome than they have been in the past.  No chest pain, chest pressure, orthopnea, or PND.  She does admit to exertional dyspnea that has been stable over time.  She is not very active anymore.  She reports gait unsteadiness and she ambulates with either a cane or a walker.  She has had some falls over the past 3 to 4 years without any major bleeding complication.  Past Medical History:  Diagnosis Date   Acute respiratory failure with hypoxia and hypercapnia (HCC)    Arthritis    Atrial fibrillation (HCC)    Baker's cyst of knee    right    Bradycardia    Cancer (HCC)    skin cancer; endometrial cancer    Colonic pseudoobstruction    Dizziness    Dysrhythmia    afib    Edema of lower extremity    bilat    GERD (gastroesophageal reflux disease)    Hard of hearing    Heart murmur    History of atrial flutter    History of bronchitis    History of kidney stones    Hypercholesteremia    Hypertension    Macular degeneration    Multiple falls    Pancreatitis    Scoliosis    Shortness of breath dyspnea    can not climb full set of steps without stopping    Toxic metabolic encephalopathy     Past Surgical History:  Procedure Laterality Date   CARDIOVERSION     DILATION AND  CURETTAGE OF UTERUS     FOOT SURGERY     bone surgery   KNEE ARTHROSCOPY Right    left breast lumpectomy      benign    REVERSE SHOULDER ARTHROPLASTY Left 02/12/2021   Procedure: REVERSE SHOULDER ARTHROPLASTY;  Surgeon: Justice Britain, MD;  Location: [facility];  Service: Orthopedics;  Laterality: Left;   TONSILLECTOMY     TOTAL KNEE ARTHROPLASTY Right 12/01/2015   Procedure: RIGHT TOTAL KNEE ARTHROPLASTY;  Surgeon: Paralee Cancel, MD;  Location: [facility];  Service: Orthopedics;  Laterality: Right;   VAGINAL HYSTERECTOMY      Current Medications: Current Meds  Medication Sig   amiodarone (PACERONE) 100 MG tablet Take 1 tablet by mouth daily.   cholecalciferol (VITAMIN D3) 25 MCG (1000 UNIT) tablet Take 1,000 Units by mouth daily.   cloNIDine (CATAPRES) 0.1 MG tablet Take by mouth.   ELIQUIS 5 MG TABS tablet TAKE 1 TABLET BY MOUTH TWICE A DAY   furosemide (LASIX) 20 MG tablet Take 1 tablet by mouth daily.   irbesartan (AVAPRO) 150 MG tablet Take 1/2 tablet by mouth every evening.   levothyroxine (SYNTHROID) 50 MCG tablet Take 50 mcg by  mouth daily before breakfast.   metoprolol tartrate (LOPRESSOR) 25 MG tablet Take 1 tablet (25 mg total) by mouth 2 (two) times daily.   Multiple Vitamin (MULTIVITAMIN) capsule Take 1 capsule by mouth daily.   Multiple Vitamins-Minerals (EYE VITAMINS PO) Take 1 tablet by mouth in the morning and at bedtime.   pantoprazole (PROTONIX) 40 MG tablet Take 1 tablet by mouth daily.   potassium chloride (KLOR-CON M) 10 MEQ tablet Take 1 tablet by mouth every other day.   simvastatin (ZOCOR) 20 MG tablet Take 1 tablet by mouth daily.   [DISCONTINUED] cephALEXin (KEFLEX) 500 MG capsule Take 500 mg by mouth 4 (four) times daily.     Allergies:   Evista [raloxifene], Morphine, Statins, Erythromycin, and Gentamicin   Social History   Socioeconomic History   Marital status: Married    Spouse name: Not on file   Number of children: Not on file   Years of education: Not  on file   Highest education level: Not on file  Occupational History   Occupation: Retired  Tobacco Use   Smoking status: Never   Smokeless tobacco: Never  Vaping Use   Vaping Use: Never used  Substance and Sexual Activity   Alcohol use: Yes    Alcohol/week: 0.0 standard drinks of alcohol    Comment: rare glass of wine    Drug use: No   Sexual activity: Not on file  Other Topics Concern   Not on file  Social History Narrative   Not on file   Social Determinants of Health   Financial Resource Strain: Not on file  Food Insecurity: Not on file  Transportation Needs: Not on file  Physical Activity: Not on file  Stress: Not on file  Social Connections: Not on file     Family History: [name] family history includes Lymphoma in her father; Macular degeneration in her father and mother.  ROS:   Please see the history of present illness.    All other systems reviewed and are negative.  EKGs/Labs/Other Studies Reviewed:    The following studies were reviewed today: Echo 01/04/2019:  1. The left ventricle has normal systolic function with an ejection  fraction of 60-65%. The cavity size was normal. There is moderately  increased left ventricular wall thickness. Left ventricular diastolic  parameters were normal.   2. The right ventricle has normal systolic function. The cavity was  normal. There is no increase in right ventricular wall thickness.   3. Left atrial size was severely dilated.   4. The mitral valve is normal in structure. There is mild mitral annular  calcification present.   5. The tricuspid valve is normal in structure.   6. The aortic valve is tricuspid Mild thickening of the aortic valve Mild  calcification of the aortic valve.   7. The pulmonic valve was grossly normal. Pulmonic valve regurgitation is  mild by color flow Doppler.   EKG:  EKG is ordered today.  The ekg ordered today demonstrates NSR 62 bpm, age-indeterminate septal infarct  Recent  Labs: No results found for requested labs within last 365 days.  Recent Lipid Panel    Component Value Date/Time   CHOL 177 11/11/2021 1242   TRIG 78 11/11/2021 1242   HDL 67 11/11/2021 1242   CHOLHDL 2.6 11/11/2021 1242   LDLCALC 96 11/11/2021 1242     Risk Assessment/Calculations:    CHA2DS2-VASc Score = 4   This indicates a 4.8% annual risk of stroke. [name] score  is based upon: CHF History: 0 HTN History: 1 Diabetes History: 0 Stroke History: 0 Vascular Disease History: 0 Age Score: 2 Gender Score: 1               Physical Exam:    VS:  BP 130/80   Pulse 62   Ht 5' (1.524 m)   Wt 146 lb (66.2 kg)   SpO2 97%   BMI 28.51 kg/m     Wt Readings from Last 3 Encounters:  12/20/22 146 lb (66.2 kg)  06/20/22 146 lb 3.2 oz (66.3 kg)  12/01/21 144 lb 12.8 oz (65.7 kg)     GEN:  Well nourished, well developed in no acute distress HEENT: Normal NECK: No JVD; No carotid bruits LYMPHATICS: No lymphadenopathy CARDIAC: RRR, 2/6 SEM at the RUSB RESPIRATORY:  Clear to auscultation without rales, wheezing or rhonchi  ABDOMEN: Soft, non-tender, non-distended MUSCULOSKELETAL:  No edema; No deformity  SKIN: Warm and dry NEUROLOGIC:  Alert and oriented x 3 PSYCHIATRIC:  Normal affect   ASSESSMENT:    1. PAF (paroxysmal atrial fibrillation) ([place])   2. Essential hypertension   3. Mixed hyperlipidemia   4. Medication management   5. Shortness of breath    PLAN:    In order of problems listed above:  The patient appears clinically stable with only 1 recent episode of heart palpitations.  Overall she has been doing well.  She is tolerating oral anticoagulation with apixaban.  She follows a rhythm control strategy with amiodarone.  She is taking 100 mg daily.  I have recommended a TSH, free T4, and LFTs for amiodarone surveillance.  She will also have a chest x-ray in the setting of chronic shortness of breath.  She sees a retinal specialist every year so her eye  exams are up-to-date.  She denies any bleeding problems related to chronic oral anticoagulation.  Continue current management and follow-up in 1 year. Blood pressure is well-controlled on irbesartan and metoprolol.  She has not required as needed clonidine in the past few years. Treated with low-dose simvastatin.  Last lipids with a cholesterol 160, HDL 54, LDL 96. Amiodarone monitoring labs as above Check CXR, at least partly due to deconditioning.    Medication Adjustments/Labs and Tests Ordered: Current medicines are reviewed at length with the patient today.  Concerns regarding medicines are outlined above.  Orders Placed This Encounter  Procedures   DG Chest 2 View   Comprehensive metabolic panel   CBC   TSH   T4, free   EKG 12-Lead   No orders of the defined types were placed in this encounter.   Patient Instructions  Medication Instructions:  Your physician recommends that you continue on your current medications as directed. Please refer to the Current Medication list given to you today.  *If you need a refill on your cardiac medications before your next appointment, please call your pharmacy*   Lab Work: TSH, Free T4, CMET, CBC If you have labs (blood work) drawn today and your tests are completely normal, you will receive your results only by: [place] (if you have MyChart) OR A paper copy in the mail If you have any lab test that is abnormal or we need to change your treatment, we will call you to review the results.   Testing/Procedures: Chest x-ray A chest x-ray takes a picture of the organs and structures inside the chest, including the heart, lungs, and blood vessels. This test can show several things, including, whether the  heart is enlarges; whether fluid is building up in the lungs; and whether pacemaker / defibrillator leads are still in place.  Follow-Up: At [facility], you and your health needs are our priority.  As part of our continuing  mission to provide you with exceptional heart care, we have created designated Provider Care Teams.  These Care Teams include your primary Cardiologist (physician) and Advanced Practice Providers (APPs -  Physician Assistants and Nurse Practitioners) who all work together to provide you with the care you need, when you need it.  Your next appointment:   1 year(s)  Provider:   Sherren Mocha, MD        Signed, Sherren Mocha, MD  12/20/2022 1:09 PM    [place]

## 2022-12-21 LAB — CBC
Hematocrit: 37.2 % (ref 34.0–46.6)
Hemoglobin: 12.1 g/dL (ref 11.1–15.9)
MCH: 28.4 pg (ref 26.6–33.0)
MCHC: 32.5 g/dL (ref 31.5–35.7)
MCV: 87 fL (ref 79–97)
Platelets: 238 10*3/uL (ref 150–450)
RBC: 4.26 x10E6/uL (ref 3.77–5.28)
RDW: 12.6 % (ref 11.7–15.4)
WBC: 8.4 10*3/uL (ref 3.4–10.8)

## 2022-12-21 LAB — COMPREHENSIVE METABOLIC PANEL
ALT: 14 IU/L (ref 0–32)
AST: 26 IU/L (ref 0–40)
Albumin/Globulin Ratio: 1.8 (ref 1.2–2.2)
Albumin: 4.4 g/dL (ref 3.7–4.7)
Alkaline Phosphatase: 110 IU/L (ref 44–121)
BUN/Creatinine Ratio: 20 (ref 12–28)
BUN: 23 mg/dL (ref 8–27)
Bilirubin Total: 0.4 mg/dL (ref 0.0–1.2)
CO2: 26 mmol/L (ref 20–29)
Calcium: 10 mg/dL (ref 8.7–10.3)
Chloride: 104 mmol/L (ref 96–106)
Creatinine, Ser: 1.17 mg/dL — ABNORMAL HIGH (ref 0.57–1.00)
Globulin, Total: 2.4 g/dL (ref 1.5–4.5)
Glucose: 95 mg/dL (ref 70–99)
Potassium: 4.6 mmol/L (ref 3.5–5.2)
Sodium: 143 mmol/L (ref 134–144)
Total Protein: 6.8 g/dL (ref 6.0–8.5)
eGFR: 45 mL/min/{1.73_m2} — ABNORMAL LOW (ref >59)

## 2022-12-21 LAB — TSH: TSH: 2.55 u[IU]/mL (ref 0.450–4.500)

## 2022-12-21 LAB — T4, FREE: Free T4: 1.84 ng/dL — ABNORMAL HIGH (ref 0.82–1.77)

## 2022-12-28 DIAGNOSIS — C44722 Squamous cell carcinoma of skin of right lower limb, including hip: Secondary | ICD-10-CM | POA: Diagnosis not present

## 2022-12-28 DIAGNOSIS — L570 Actinic keratosis: Secondary | ICD-10-CM | POA: Diagnosis not present

## 2023-01-19 ENCOUNTER — Telehealth: Payer: Self-pay | Admitting: Cardiovascular Disease

## 2023-01-19 DIAGNOSIS — R0602 Shortness of breath: Secondary | ICD-10-CM

## 2023-01-19 DIAGNOSIS — Z79899 Other long term (current) drug therapy: Secondary | ICD-10-CM

## 2023-01-19 NOTE — Telephone Encounter (Signed)
-----   Message from Sherren Mocha, MD sent at 01/19/2023  1:46 PM EDT ----- Patient asymptomatic, but with use of amiodarone I would like her to have a high-resolution CT scan of the chest.  I spoke to her husband.  He will await our call to schedule.

## 2023-01-19 NOTE — Telephone Encounter (Signed)
Called and spoke with husband who states they are leaving for [facility] 01/25/23. Per Dr Burt Knack, he'd like this test done prior to them leaving if at all possible. Spoke with [facility] team here and advised that pt can call [facility] imaging their selves and schedule or we can enter for [facility] location and then see how quickly they can get it done. Patient states he'd like to check with [facility] first since he is familiar with this location and they're coming from [place]. Advised that he call us back if they cannot accommodate the time crunch and we'd see what we can get scheduled for him.

## 2023-01-23 ENCOUNTER — Ambulatory Visit
Admission: RE | Admit: 2023-01-23 | Discharge: 2023-01-23 | Disposition: A | Discharge status: Home / Self Care | Payer: Medicare Other | Source: Ambulatory Visit | Attending: Cardiovascular Disease | Admitting: Cardiovascular Disease

## 2023-01-23 DIAGNOSIS — R0602 Shortness of breath: Secondary | ICD-10-CM

## 2023-01-23 DIAGNOSIS — J479 Bronchiectasis, uncomplicated: Secondary | ICD-10-CM | POA: Diagnosis not present

## 2023-01-23 DIAGNOSIS — J84112 Idiopathic pulmonary fibrosis: Secondary | ICD-10-CM | POA: Diagnosis not present

## 2023-01-23 DIAGNOSIS — Z79899 Other long term (current) drug therapy: Secondary | ICD-10-CM

## 2023-01-23 DIAGNOSIS — I700 Atherosclerosis of aorta: Secondary | ICD-10-CM | POA: Diagnosis not present

## 2023-01-23 DIAGNOSIS — S2220XA Unspecified fracture of sternum, initial encounter for closed fracture: Secondary | ICD-10-CM | POA: Diagnosis not present

## 2023-01-26 DIAGNOSIS — C44722 Squamous cell carcinoma of skin of right lower limb, including hip: Secondary | ICD-10-CM | POA: Diagnosis not present

## 2023-01-26 DIAGNOSIS — L570 Actinic keratosis: Secondary | ICD-10-CM | POA: Diagnosis not present

## 2023-02-02 ENCOUNTER — Telehealth: Payer: Self-pay | Admitting: Cardiovascular Disease

## 2023-02-02 DIAGNOSIS — J984 Other disorders of lung: Secondary | ICD-10-CM

## 2023-02-02 NOTE — Telephone Encounter (Signed)
-----   Message from Sherren Mocha, MD sent at 01/31/2023  5:27 PM EDT ----- Reviewed results with the patient's husband over the telephone.  Pertinent findings include subpleural reticulation and groundglass opacities.  Also incidental findings of liver nodularity and suspicion of renal cell carcinoma which is known and has been followed by urology.  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  The patient is not symptomatic at this time.  I have recommended that she stop amiodarone which she has been on for at least 9 years at a dose of 100 mg daily.  Will also request pulmonary referral for further evaluation of interstitial lung disease.

## 2023-02-02 NOTE — Telephone Encounter (Signed)
Referral placed to pulmonology at this time.

## 2023-02-04 ENCOUNTER — Other Ambulatory Visit: Payer: Self-pay | Admitting: Cardiovascular Disease

## 2023-02-04 DIAGNOSIS — I480 Paroxysmal atrial fibrillation: Secondary | ICD-10-CM

## 2023-02-06 NOTE — Telephone Encounter (Signed)
Prescription refill request for Eliquis received. Indication: Afib  Last office visit: 12/20/22 Excell Seltzer) Scr: 1.17 (12/20/22)  Age: 89 Weight: 66.2kg  Appropriate dose. Refill sent.

## 2023-02-07 ENCOUNTER — Other Ambulatory Visit: Payer: Self-pay | Admitting: Cardiovascular Disease

## 2023-02-07 DIAGNOSIS — L570 Actinic keratosis: Secondary | ICD-10-CM | POA: Diagnosis not present

## 2023-02-07 DIAGNOSIS — D1801 Hemangioma of skin and subcutaneous tissue: Secondary | ICD-10-CM | POA: Diagnosis not present

## 2023-02-07 DIAGNOSIS — D692 Other nonthrombocytopenic purpura: Secondary | ICD-10-CM | POA: Diagnosis not present

## 2023-02-07 DIAGNOSIS — L814 Other melanin hyperpigmentation: Secondary | ICD-10-CM | POA: Diagnosis not present

## 2023-02-07 DIAGNOSIS — D2371 Other benign neoplasm of skin of right lower limb, including hip: Secondary | ICD-10-CM | POA: Diagnosis not present

## 2023-02-07 DIAGNOSIS — Z85828 Personal history of other malignant neoplasm of skin: Secondary | ICD-10-CM | POA: Diagnosis not present

## 2023-02-07 DIAGNOSIS — Z85820 Personal history of malignant melanoma of skin: Secondary | ICD-10-CM | POA: Diagnosis not present

## 2023-02-07 DIAGNOSIS — L578 Other skin changes due to chronic exposure to nonionizing radiation: Secondary | ICD-10-CM | POA: Diagnosis not present

## 2023-02-07 DIAGNOSIS — L821 Other seborrheic keratosis: Secondary | ICD-10-CM | POA: Diagnosis not present

## 2023-02-07 DIAGNOSIS — L820 Inflamed seborrheic keratosis: Secondary | ICD-10-CM | POA: Diagnosis not present

## 2023-02-17 DIAGNOSIS — E039 Hypothyroidism, unspecified: Secondary | ICD-10-CM | POA: Diagnosis not present

## 2023-02-17 DIAGNOSIS — Z0000 Encounter for general adult medical examination without abnormal findings: Secondary | ICD-10-CM | POA: Diagnosis not present

## 2023-02-17 DIAGNOSIS — I10 Essential (primary) hypertension: Secondary | ICD-10-CM | POA: Diagnosis not present

## 2023-02-17 DIAGNOSIS — E785 Hyperlipidemia, unspecified: Secondary | ICD-10-CM | POA: Diagnosis not present

## 2023-02-17 DIAGNOSIS — Z79899 Other long term (current) drug therapy: Secondary | ICD-10-CM | POA: Diagnosis not present

## 2023-02-17 DIAGNOSIS — I4892 Unspecified atrial flutter: Secondary | ICD-10-CM | POA: Diagnosis not present

## 2023-02-20 NOTE — Progress Notes (Unsigned)
Synopsis: Referred for concern for amiodarone toxicity by Street, Stephanie Coup, *  Subjective:   PATIENT ID: Elizabeth Everett GENDER: female DOB: Jul 04, 1932, MRN: [id number]  No chief complaint on file.  89yF with history of AF, GERD, scoliosis referred for CT Chest concerning for amiodarone toxicity  Otherwise pertinent review of systems is negative.  Past Medical History:  Diagnosis Date   Acute respiratory failure with hypoxia and hypercapnia (HCC)    Arthritis    Atrial fibrillation (HCC)    Baker's cyst of knee    right    Bradycardia    Cancer (HCC)    skin cancer; endometrial cancer    Colonic pseudoobstruction    Dizziness    Dysrhythmia    afib    Edema of lower extremity    bilat    GERD (gastroesophageal reflux disease)    Hard of hearing    Heart murmur    History of atrial flutter    History of bronchitis    History of kidney stones    Hypercholesteremia    Hypertension    Macular degeneration    Multiple falls    Pancreatitis    Scoliosis    Shortness of breath dyspnea    can not climb full set of steps without stopping    Toxic metabolic encephalopathy      Family History  Problem Relation Age of Onset   Lymphoma Father    Macular degeneration Father    Macular degeneration Mother      Past Surgical History:  Procedure Laterality Date   CARDIOVERSION     DILATION AND CURETTAGE OF UTERUS     FOOT SURGERY     bone surgery   KNEE ARTHROSCOPY Right    left breast lumpectomy      benign    REVERSE SHOULDER ARTHROPLASTY Left 02/12/2021   Procedure: REVERSE SHOULDER ARTHROPLASTY;  Surgeon: Francena Hanly, MD;  Location: [facility];  Service: Orthopedics;  Laterality: Left;   TONSILLECTOMY     TOTAL KNEE ARTHROPLASTY Right 12/01/2015   Procedure: RIGHT TOTAL KNEE ARTHROPLASTY;  Surgeon: Durene Romans, MD;  Location: [facility];  Service: Orthopedics;  Laterality: Right;   VAGINAL HYSTERECTOMY      Social History   Socioeconomic History   Marital  status: Married    Spouse name: Not on file   Number of children: Not on file   Years of education: Not on file   Highest education level: Not on file  Occupational History   Occupation: Retired  Tobacco Use   Smoking status: Never   Smokeless tobacco: Never  Vaping Use   Vaping Use: Never used  Substance and Sexual Activity   Alcohol use: Yes    Alcohol/week: 0.0 standard drinks of alcohol    Comment: rare glass of wine    Drug use: No   Sexual activity: Not on file  Other Topics Concern   Not on file  Social History Narrative   Not on file   Social Determinants of Health   Financial Resource Strain: Not on file  Food Insecurity: Not on file  Transportation Needs: Not on file  Physical Activity: Not on file  Stress: Not on file  Social Connections: Not on file  Intimate Partner Violence: Not on file     Allergies  Allergen Reactions   Evista [Raloxifene] Other (See Comments)    Noted per H&P per [facility] 11/09/2015 causes migraine headache   Morphine  Hallucinations    Statins     Muscle/joint pain   Erythromycin Rash   Gentamicin Rash     Outpatient Medications Prior to Visit  Medication Sig Dispense Refill   apixaban (ELIQUIS) 5 MG TABS tablet TAKE 1 TABLET BY MOUTH TWICE A DAY 180 tablet 3   cholecalciferol (VITAMIN D3) 25 MCG (1000 UNIT) tablet Take 1,000 Units by mouth daily.     cloNIDine (CATAPRES) 0.1 MG tablet Take by mouth.     furosemide (LASIX) 20 MG tablet Take 1 tablet by mouth daily. 30 tablet 6   irbesartan (AVAPRO) 150 MG tablet Take 1/2 tablet by mouth every evening. 15 tablet 6   levothyroxine (SYNTHROID) 50 MCG tablet Take 50 mcg by mouth daily before breakfast.     metoprolol tartrate (LOPRESSOR) 25 MG tablet Take 1 tablet by mouth twice daily. 180 tablet 3   Multiple Vitamin (MULTIVITAMIN) capsule Take 1 capsule by mouth daily.     Multiple Vitamins-Minerals (EYE VITAMINS PO) Take 1 tablet by mouth in the morning and at  bedtime.     pantoprazole (PROTONIX) 40 MG tablet Take 1 tablet by mouth daily. 90 tablet 2   potassium chloride (KLOR-CON M) 10 MEQ tablet Take 1 tablet by mouth every other day. 45 tablet 2   simvastatin (ZOCOR) 20 MG tablet Take 1 tablet by mouth daily. 90 tablet 2   Vitamin E 670 MG (1000 UT) CAPS Take by mouth.     No facility-administered medications prior to visit.       Objective:   Physical Exam:  General appearance: 89 y.o., female, NAD, conversant  Eyes: anicteric sclerae; PERRL, tracking appropriately HENT: NCAT; MMM Neck: Trachea midline; no lymphadenopathy, no JVD Lungs: CTAB, no crackles, no wheeze, with normal respiratory effort CV: RRR, no murmur  Abdomen: Soft, non-tender; non-distended, BS present  Extremities: No peripheral edema, warm Skin: Normal turgor and texture; no rash Psych: Appropriate affect Neuro: Alert and oriented to person and place, no focal deficit     There were no vitals filed for this visit.   on *** LPM *** RA BMI Readings from Last 3 Encounters:  12/20/22 28.51 kg/m  06/20/22 28.55 kg/m  12/01/21 28.28 kg/m   Wt Readings from Last 3 Encounters:  12/20/22 146 lb (66.2 kg)  06/20/22 146 lb 3.2 oz (66.3 kg)  12/01/21 144 lb 12.8 oz (65.7 kg)     CBC    Component Value Date/Time   WBC 8.4 12/20/2022 1102   WBC 11.0 (H) 02/09/2021 1430   RBC 4.26 12/20/2022 1102   RBC 3.64 (L) 02/09/2021 1430   HGB 12.1 12/20/2022 1102   HCT 37.2 12/20/2022 1102   PLT 238 12/20/2022 1102   MCV 87 12/20/2022 1102   MCH 28.4 12/20/2022 1102   MCH 28.8 02/09/2021 1430   MCHC 32.5 12/20/2022 1102   MCHC 31.2 02/09/2021 1430   RDW 12.6 12/20/2022 1102   LYMPHSABS 1.2 06/17/2019 1053   MONOABS 0.9 12/14/2016 1901   EOSABS 0.2 06/17/2019 1053   BASOSABS 0.1 06/17/2019 1053    ***  Chest Imaging: HRCT 01/25/23 with increased attenuation of liver, mild extent of lower lobe predominant fibrosis with traction bronchiectasis, scoliosis.  Fibrotic lung disease does not appear significantly changed from CT AP 2020  Pulmonary Functions Testing Results:     No data to display          Echocardiogram 01/04/19:    1. The left ventricle has normal systolic function with an  ejection  fraction of 60-65%. The cavity size was normal. There is moderately  increased left ventricular wall thickness. Left ventricular diastolic  parameters were normal.   2. The right ventricle has normal systolic function. The cavity was  normal. There is no increase in right ventricular wall thickness.   3. Left atrial size was severely dilated.   4. The mitral valve is normal in structure. There is mild mitral annular  calcification present.   5. The tricuspid valve is normal in structure.   6. The aortic valve is tricuspid Mild thickening of the aortic valve Mild  calcification of the aortic valve.   7. The pulmonic valve was grossly normal. Pulmonic valve regurgitation is  mild by color flow Doppler.   Heart Catheterization: ***    Assessment & Plan:    Plan:      Omar Person, MD [facility] 02/20/2023 4:57 PM

## 2023-02-22 ENCOUNTER — Ambulatory Visit (INDEPENDENT_AMBULATORY_CARE_PROVIDER_SITE_OTHER): Payer: Medicare Other | Admitting: Student

## 2023-02-22 ENCOUNTER — Encounter: Payer: Self-pay | Admitting: Student

## 2023-02-22 VITALS — BP 130/60 | HR 64 | Temp 97.8°F | Ht 60.0 in | Wt 145.0 lb

## 2023-02-22 DIAGNOSIS — J849 Interstitial pulmonary disease, unspecified: Secondary | ICD-10-CM | POA: Diagnosis not present

## 2023-02-22 LAB — CK: Total CK: 70 U/L (ref 7–177)

## 2023-02-22 NOTE — Patient Instructions (Signed)
-   labs today - We will schedule PFTs (breathing tests) in 8 weeks on same day as next clinic appointment - I'll let Dr. Excell Seltzer know that it probably would be safe to resume amiodarone if you need it for heart rate/rhythm control

## 2023-02-24 LAB — RHEUMATOID FACTOR: Rheumatoid fact SerPl-aCnc: <10 IU/mL (ref <14)

## 2023-02-24 LAB — CYCLIC CITRUL PEPTIDE ANTIBODY, IGG: Cyclic Citrullin Peptide Ab: <16 UNITS

## 2023-02-27 LAB — ANA W/REFLEX: ANA Titer 1: NEGATIVE

## 2023-03-10 DIAGNOSIS — M4180 Other forms of scoliosis, site unspecified: Secondary | ICD-10-CM | POA: Diagnosis not present

## 2023-03-10 DIAGNOSIS — M48062 Spinal stenosis, lumbar region with neurogenic claudication: Secondary | ICD-10-CM | POA: Diagnosis not present

## 2023-03-10 DIAGNOSIS — M25552 Pain in left hip: Secondary | ICD-10-CM | POA: Diagnosis not present

## 2023-03-10 DIAGNOSIS — M545 Low back pain, unspecified: Secondary | ICD-10-CM | POA: Diagnosis not present

## 2023-04-05 DIAGNOSIS — M5416 Radiculopathy, lumbar region: Secondary | ICD-10-CM | POA: Diagnosis not present

## 2023-04-05 DIAGNOSIS — M47816 Spondylosis without myelopathy or radiculopathy, lumbar region: Secondary | ICD-10-CM | POA: Diagnosis not present

## 2023-04-14 DIAGNOSIS — J40 Bronchitis, not specified as acute or chronic: Secondary | ICD-10-CM | POA: Diagnosis not present

## 2023-04-14 DIAGNOSIS — R059 Cough, unspecified: Secondary | ICD-10-CM | POA: Diagnosis not present

## 2023-04-14 DIAGNOSIS — R0602 Shortness of breath: Secondary | ICD-10-CM | POA: Diagnosis not present

## 2023-04-23 NOTE — Progress Notes (Unsigned)
Synopsis: Referred for concern for amiodarone toxicity by Street, Stephanie Coup, *  Subjective:   PATIENT ID: Elizabeth Everett GENDER: female DOB: 01/25/1932, MRN: [id number]  No chief complaint on file.  89yF with history of AF, GERD, scoliosis referred for CT Chest concerning for amiodarone toxicity  Gets winded with climbing stairs. She has some cough periodically with sensation of phlegm in her throat all the time. A sip of wine helps. She has had trouble swallowing in the past with solids getting stuck on the way down but protonix helped with it - she is still on it. She never had covid-19. No prior really severe bouts of pneumonia. No prior chemotherapy.   She has no family history of lung disease.   She managed apartments, beach property. She never smoked. No vaping/MJ. She has lived in [place], [place], [place]. No pet bird, hot tub.   Interval HPI CTD serologies unremarkable  PFTs  Otherwise pertinent review of systems is negative.  Past Medical History:  Diagnosis Date   Acute respiratory failure with hypoxia and hypercapnia (HCC)    Arthritis    Atrial fibrillation (HCC)    Baker's cyst of knee    right    Bradycardia    Cancer (HCC)    skin cancer; endometrial cancer    Colonic pseudoobstruction    Dizziness    Dysrhythmia    afib    Edema of lower extremity    bilat    GERD (gastroesophageal reflux disease)    Hard of hearing    Heart murmur    History of atrial flutter    History of bronchitis    History of kidney stones    Hypercholesteremia    Hypertension    Macular degeneration    Multiple falls    Pancreatitis    Scoliosis    Shortness of breath dyspnea    can not climb full set of steps without stopping    Toxic metabolic encephalopathy      Family History  Problem Relation Age of Onset   Macular degeneration Mother    Lymphoma Father    Macular degeneration Father    Lung cancer Paternal Uncle        smoked     Past Surgical History:   Procedure Laterality Date   CARDIOVERSION     DILATION AND CURETTAGE OF UTERUS     FOOT SURGERY     bone surgery   KNEE ARTHROSCOPY Right    left breast lumpectomy      benign    REVERSE SHOULDER ARTHROPLASTY Left 02/12/2021   Procedure: REVERSE SHOULDER ARTHROPLASTY;  Surgeon: Francena Hanly, MD;  Location: [facility];  Service: Orthopedics;  Laterality: Left;   TONSILLECTOMY     TOTAL KNEE ARTHROPLASTY Right 12/01/2015   Procedure: RIGHT TOTAL KNEE ARTHROPLASTY;  Surgeon: Durene Romans, MD;  Location: [facility];  Service: Orthopedics;  Laterality: Right;   VAGINAL HYSTERECTOMY      Social History   Socioeconomic History   Marital status: Married    Spouse name: Not on file   Number of children: Not on file   Years of education: Not on file   Highest education level: Not on file  Occupational History   Occupation: Retired  Tobacco Use   Smoking status: Never    Passive exposure: Past   Smokeless tobacco: Never  Vaping Use   Vaping Use: Never used  Substance and Sexual Activity   Alcohol use: Yes    Alcohol/week:  0.0 standard drinks of alcohol    Comment: rare glass of wine    Drug use: No   Sexual activity: Not on file  Other Topics Concern   Not on file  Social History Narrative   Not on file   Social Determinants of Health   Financial Resource Strain: Not on file  Food Insecurity: Not on file  Transportation Needs: Not on file  Physical Activity: Not on file  Stress: Not on file  Social Connections: Not on file  Intimate Partner Violence: Not on file     Allergies  Allergen Reactions   Evista [Raloxifene] Other (See Comments)    Noted per H&P per [facility] 11/09/2015 causes migraine headache   Morphine     Hallucinations    Statins     Muscle/joint pain   Erythromycin Rash   Gentamicin Rash     Outpatient Medications Prior to Visit  Medication Sig Dispense Refill   apixaban (ELIQUIS) 5 MG TABS tablet TAKE 1 TABLET BY MOUTH TWICE A DAY 180 tablet  3   b complex vitamins capsule Take 1 capsule by mouth daily.     cholecalciferol (VITAMIN D3) 25 MCG (1000 UNIT) tablet Take 1,000 Units by mouth daily.     cloNIDine (CATAPRES) 0.1 MG tablet Take by mouth.     furosemide (LASIX) 20 MG tablet Take 1 tablet by mouth daily. 30 tablet 6   irbesartan (AVAPRO) 150 MG tablet Take 1/2 tablet by mouth every evening. 15 tablet 6   levothyroxine (SYNTHROID) 50 MCG tablet Take 50 mcg by mouth daily before breakfast.     metoprolol tartrate (LOPRESSOR) 25 MG tablet Take 1 tablet by mouth twice daily. 180 tablet 3   Multiple Vitamin (MULTIVITAMIN) capsule Take 1 capsule by mouth daily.     Multiple Vitamins-Minerals (EYE VITAMINS PO) Take 1 tablet by mouth in the morning and at bedtime.     pantoprazole (PROTONIX) 40 MG tablet Take 1 tablet by mouth daily. 90 tablet 2   potassium chloride (KLOR-CON M) 10 MEQ tablet Take 1 tablet by mouth every other day. 45 tablet 2   simvastatin (ZOCOR) 20 MG tablet Take 1 tablet by mouth daily. 90 tablet 2   Vitamin E 670 MG (1000 UT) CAPS Take by mouth.     No facility-administered medications prior to visit.       Objective:   Physical Exam:  General appearance: 89 y.o., female, NAD, conversant, female, NAD, conversant  Eyes: anicteric sclerae; PERRL, tracking appropriately HENT: NCAT; MMM Neck: Trachea midline; no lymphadenopathy, no JVD Lungs: faint crackles R base, with normal respiratory effort CV: RRR, no murmur  Abdomen: Soft, non-tender; non-distended, BS present  Extremities: No peripheral edema, warm Skin: Normal turgor and texture; no rash Psych: Appropriate affect Neuro: Alert and oriented to person and place, no focal deficit     There were no vitals filed for this visit.    on RA BMI Readings from Last 3 Encounters:  02/22/23 28.32 kg/m  12/20/22 28.51 kg/m  06/20/22 28.55 kg/m   Wt Readings from Last 3 Encounters:  02/22/23 145 lb (65.8 kg)  12/20/22 146 lb (66.2 kg)  06/20/22 146 lb 3.2 oz (66.3  kg)     CBC    Component Value Date/Time   WBC 8.4 12/20/2022 1102   WBC 11.0 (H) 02/09/2021 1430   RBC 4.26 12/20/2022 1102   RBC 3.64 (L) 02/09/2021 1430   HGB 12.1 12/20/2022 1102   HCT 37.2 12/20/2022 1102  PLT 238 12/20/2022 1102   MCV 87 12/20/2022 1102   MCH 28.4 12/20/2022 1102   MCH 28.8 02/09/2021 1430   MCHC 32.5 12/20/2022 1102   MCHC 31.2 02/09/2021 1430   RDW 12.6 12/20/2022 1102   LYMPHSABS 1.2 06/17/2019 1053   MONOABS 0.9 12/14/2016 1901   EOSABS 0.2 06/17/2019 1053   BASOSABS 0.1 06/17/2019 1053     Chest Imaging: HRCT 01/25/23 with increased attenuation of liver, mild extent of lower lobe predominant fibrosis with traction bronchiectasis, scoliosis. Fibrotic lung disease does not appear significantly changed from CT AP 2020  Pulmonary Functions Testing Results:     No data to display          Echocardiogram 01/04/19:    1. The left ventricle has normal systolic function with an ejection  fraction of 60-65%. The cavity size was normal. There is moderately  increased left ventricular wall thickness. Left ventricular diastolic  parameters were normal.   2. The right ventricle has normal systolic function. The cavity was  normal. There is no increase in right ventricular wall thickness.   3. Left atrial size was severely dilated.   4. The mitral valve is normal in structure. There is mild mitral annular  calcification present.   5. The tricuspid valve is normal in structure.   6. The aortic valve is tricuspid Mild thickening of the aortic valve Mild  calcification of the aortic valve.   7. The pulmonic valve was grossly normal. Pulmonic valve regurgitation is  mild by color flow Doppler.      Assessment & Plan:   # ILD Minor extent of reticulation, traction bronchiectasis in lung bases which appears stable since 2020. Possibly related to amio exposure (says she's been on it for 10 years) though even if it was, it doesn't really appear to have  progressed at all. Alternatively chronic aspiration/reflux, early IPF, lower suspicion for CTD-ILD. Regardless doesn't sound like she's impressively symptomatic currently.    Plan: - CTD serologies - PFTs in 8 weeks on same day as next appointment     Omar Person, MD [facility] 04/23/2023 11:49 AM

## 2023-04-25 ENCOUNTER — Ambulatory Visit (INDEPENDENT_AMBULATORY_CARE_PROVIDER_SITE_OTHER): Payer: Medicare Other | Admitting: Student

## 2023-04-25 ENCOUNTER — Encounter: Payer: Self-pay | Admitting: Student

## 2023-04-25 ENCOUNTER — Ambulatory Visit: Payer: Medicare Other | Admitting: Student

## 2023-04-25 ENCOUNTER — Ambulatory Visit (INDEPENDENT_AMBULATORY_CARE_PROVIDER_SITE_OTHER): Payer: Medicare Other

## 2023-04-25 VITALS — BP 116/70 | HR 74 | Ht 59.0 in | Wt 139.0 lb

## 2023-04-25 DIAGNOSIS — R052 Subacute cough: Secondary | ICD-10-CM | POA: Diagnosis not present

## 2023-04-25 DIAGNOSIS — J849 Interstitial pulmonary disease, unspecified: Secondary | ICD-10-CM | POA: Diagnosis not present

## 2023-04-25 DIAGNOSIS — I700 Atherosclerosis of aorta: Secondary | ICD-10-CM | POA: Diagnosis not present

## 2023-04-25 DIAGNOSIS — R0602 Shortness of breath: Secondary | ICD-10-CM | POA: Diagnosis not present

## 2023-04-25 DIAGNOSIS — R059 Cough, unspecified: Secondary | ICD-10-CM | POA: Diagnosis not present

## 2023-04-25 DIAGNOSIS — Z96612 Presence of left artificial shoulder joint: Secondary | ICD-10-CM | POA: Diagnosis not present

## 2023-04-25 MED ORDER — CEFDINIR 300 MG PO CAPS: 300.0000 mg | ORAL_CAPSULE | Freq: Two times a day (BID) | ORAL | 0 refills | Status: DC

## 2023-04-25 NOTE — Patient Instructions (Signed)
-   x ray today - cefdinir for 7 days to complete course of antibiotics for possible bronchiectasis exacerbation. Would eat some yogurt while you're on these antibiotics - we will reschedule PFTs (breathing tests) and clinic visit in 3 months with either Dr. Isaiah Serge or Dr. Marchelle Gearing. I'd also be glad to see you in [place] at [facility].

## 2023-04-25 NOTE — Progress Notes (Signed)
Patient unable to complete PFT due to technical issues with the internet. Physician notified.

## 2023-04-25 NOTE — Patient Instructions (Signed)
Patient unable to complete PFT due to technical issues. Physician notified.

## 2023-06-06 ENCOUNTER — Other Ambulatory Visit: Payer: Self-pay | Admitting: Cardiovascular Disease

## 2023-07-18 DIAGNOSIS — H40023 Open angle with borderline findings, high risk, bilateral: Secondary | ICD-10-CM | POA: Diagnosis not present

## 2023-07-18 DIAGNOSIS — H43813 Vitreous degeneration, bilateral: Secondary | ICD-10-CM | POA: Diagnosis not present

## 2023-07-18 DIAGNOSIS — H353232 Exudative age-related macular degeneration, bilateral, with inactive choroidal neovascularization: Secondary | ICD-10-CM | POA: Diagnosis not present

## 2023-07-20 DIAGNOSIS — Z23 Encounter for immunization: Secondary | ICD-10-CM | POA: Diagnosis not present

## 2023-07-31 ENCOUNTER — Ambulatory Visit: Payer: Medicare Other | Admitting: Internal Medicine

## 2023-08-03 ENCOUNTER — Encounter: Payer: Self-pay | Admitting: Cardiovascular Disease

## 2023-08-03 DIAGNOSIS — I480 Paroxysmal atrial fibrillation: Secondary | ICD-10-CM

## 2023-08-04 NOTE — Telephone Encounter (Signed)
Spoke with pt's husband Greggory Stallion who would like to come into office Monday to have monitor applied. Order placed. Katy/shelly aware.

## 2023-08-07 ENCOUNTER — Ambulatory Visit: Payer: Medicare Other | Attending: Cardiovascular Disease

## 2023-08-07 DIAGNOSIS — I480 Paroxysmal atrial fibrillation: Secondary | ICD-10-CM

## 2023-08-07 NOTE — Progress Notes (Unsigned)
ZIO XT serial # [id number] from office inventory applied to patient using tincture of benzoin.

## 2023-08-10 DIAGNOSIS — L565 Disseminated superficial actinic porokeratosis (DSAP): Secondary | ICD-10-CM | POA: Diagnosis not present

## 2023-08-10 DIAGNOSIS — L821 Other seborrheic keratosis: Secondary | ICD-10-CM | POA: Diagnosis not present

## 2023-08-10 DIAGNOSIS — D1801 Hemangioma of skin and subcutaneous tissue: Secondary | ICD-10-CM | POA: Diagnosis not present

## 2023-08-10 DIAGNOSIS — L570 Actinic keratosis: Secondary | ICD-10-CM | POA: Diagnosis not present

## 2023-08-10 DIAGNOSIS — Z85828 Personal history of other malignant neoplasm of skin: Secondary | ICD-10-CM | POA: Diagnosis not present

## 2023-08-10 DIAGNOSIS — L578 Other skin changes due to chronic exposure to nonionizing radiation: Secondary | ICD-10-CM | POA: Diagnosis not present

## 2023-08-10 DIAGNOSIS — L720 Epidermal cyst: Secondary | ICD-10-CM | POA: Diagnosis not present

## 2023-08-10 DIAGNOSIS — L820 Inflamed seborrheic keratosis: Secondary | ICD-10-CM | POA: Diagnosis not present

## 2023-08-10 DIAGNOSIS — Z85820 Personal history of malignant melanoma of skin: Secondary | ICD-10-CM | POA: Diagnosis not present

## 2023-08-17 DIAGNOSIS — Z23 Encounter for immunization: Secondary | ICD-10-CM | POA: Diagnosis not present

## 2023-08-21 DIAGNOSIS — I480 Paroxysmal atrial fibrillation: Secondary | ICD-10-CM | POA: Diagnosis not present

## 2023-08-22 DIAGNOSIS — L03115 Cellulitis of right lower limb: Secondary | ICD-10-CM | POA: Diagnosis not present

## 2023-08-22 DIAGNOSIS — I878 Other specified disorders of veins: Secondary | ICD-10-CM | POA: Diagnosis not present

## 2023-08-22 DIAGNOSIS — E538 Deficiency of other specified B group vitamins: Secondary | ICD-10-CM | POA: Diagnosis not present

## 2023-08-22 DIAGNOSIS — E039 Hypothyroidism, unspecified: Secondary | ICD-10-CM | POA: Diagnosis not present

## 2023-08-22 DIAGNOSIS — N1831 Chronic kidney disease, stage 3a: Secondary | ICD-10-CM | POA: Diagnosis not present

## 2023-08-22 DIAGNOSIS — L03116 Cellulitis of left lower limb: Secondary | ICD-10-CM | POA: Diagnosis not present

## 2023-08-22 DIAGNOSIS — I872 Venous insufficiency (chronic) (peripheral): Secondary | ICD-10-CM | POA: Diagnosis not present

## 2023-08-22 DIAGNOSIS — R7301 Impaired fasting glucose: Secondary | ICD-10-CM | POA: Diagnosis not present

## 2023-08-22 DIAGNOSIS — Z79899 Other long term (current) drug therapy: Secondary | ICD-10-CM | POA: Diagnosis not present

## 2023-08-22 DIAGNOSIS — E559 Vitamin D deficiency, unspecified: Secondary | ICD-10-CM | POA: Diagnosis not present

## 2023-08-22 DIAGNOSIS — E785 Hyperlipidemia, unspecified: Secondary | ICD-10-CM | POA: Diagnosis not present

## 2023-08-30 ENCOUNTER — Telehealth: Payer: Self-pay

## 2023-08-30 DIAGNOSIS — I4819 Other persistent atrial fibrillation: Secondary | ICD-10-CM

## 2023-08-30 NOTE — Telephone Encounter (Signed)
Called and spoke with husband on [place]. Pt scheduled to see Excell Seltzer on 09/01/23. ECHO ordered and scheduled for 09/14/23.

## 2023-08-30 NOTE — Telephone Encounter (Signed)
-----   Message from Tonny Bollman sent at 08/30/2023 11:36 AM EDT ----- Pt persistently in afib. HR should be better controlled. Please add her on to one of my upcoming clinic schedules to review with them. Please schedule her for an echo to reassess LV function in the setting of persistent afib. thanks

## 2023-08-31 ENCOUNTER — Other Ambulatory Visit: Payer: Self-pay | Admitting: Cardiovascular Disease

## 2023-08-31 DIAGNOSIS — I480 Paroxysmal atrial fibrillation: Secondary | ICD-10-CM

## 2023-08-31 DIAGNOSIS — R002 Palpitations: Secondary | ICD-10-CM

## 2023-09-01 ENCOUNTER — Ambulatory Visit: Payer: Medicare Other | Attending: Cardiovascular Disease | Admitting: Cardiovascular Disease

## 2023-09-01 ENCOUNTER — Encounter: Payer: Self-pay | Admitting: Cardiovascular Disease

## 2023-09-01 VITALS — BP 124/80 | HR 86 | Ht 59.0 in | Wt 139.8 lb

## 2023-09-01 DIAGNOSIS — I4819 Other persistent atrial fibrillation: Secondary | ICD-10-CM | POA: Insufficient documentation

## 2023-09-01 DIAGNOSIS — T462X5D Adverse effect of other antidysrhythmic drugs, subsequent encounter: Secondary | ICD-10-CM | POA: Diagnosis not present

## 2023-09-01 DIAGNOSIS — J984 Other disorders of lung: Secondary | ICD-10-CM | POA: Diagnosis not present

## 2023-09-01 DIAGNOSIS — T462X5A Adverse effect of other antidysrhythmic drugs, initial encounter: Secondary | ICD-10-CM | POA: Diagnosis not present

## 2023-09-01 DIAGNOSIS — E782 Mixed hyperlipidemia: Secondary | ICD-10-CM | POA: Insufficient documentation

## 2023-09-01 DIAGNOSIS — I10 Essential (primary) hypertension: Secondary | ICD-10-CM | POA: Insufficient documentation

## 2023-09-01 MED ORDER — METOPROLOL TARTRATE 50 MG PO TABS: 50.0000 mg | ORAL_TABLET | Freq: Two times a day (BID) | ORAL | 0 refills | Status: DC

## 2023-09-01 MED ORDER — METOPROLOL TARTRATE 50 MG PO TABS: 50.0000 mg | ORAL_TABLET | Freq: Two times a day (BID) | ORAL | 3 refills | Status: DC

## 2023-09-01 NOTE — Patient Instructions (Signed)
Medication Instructions:  INCREASE Metoprolol Tartrate 50mg  twice daily *If you need a refill on your cardiac medications before your next appointment, please call your pharmacy*  Testing: Ambulatory referral to cardiac electrophysiology  Follow-Up: At [facility], you and your health needs are our priority.  As part of our continuing mission to provide you with exceptional heart care, we have created designated Provider Care Teams.  These Care Teams include your primary Cardiologist (physician) and Advanced Practice Providers (APPs -  Physician Assistants and Nurse Practitioners) who all work together to provide you with the care you need, when you need it.  Your next appointment:   6 month(s)  Provider:   Tonny Bollman, MD

## 2023-09-05 ENCOUNTER — Encounter: Payer: Self-pay | Admitting: Cardiovascular Disease

## 2023-09-06 ENCOUNTER — Ambulatory Visit: Payer: Medicare Other | Admitting: Cardiovascular Disease

## 2023-09-14 ENCOUNTER — Ambulatory Visit (HOSPITAL_COMMUNITY): Payer: Medicare Other | Attending: Cardiology

## 2023-09-14 ENCOUNTER — Encounter: Payer: Self-pay | Admitting: Cardiovascular Disease

## 2023-09-14 DIAGNOSIS — I4819 Other persistent atrial fibrillation: Secondary | ICD-10-CM | POA: Diagnosis not present

## 2023-09-14 LAB — ECHOCARDIOGRAM COMPLETE: S' Lateral: 4.17 cm

## 2023-10-03 NOTE — Progress Notes (Unsigned)
Electrophysiology Office Note:    Date:  10/04/2023   ID:  Elizabeth Everett, DOB Sep 01, 1932, MRN [id number]  PCP:  Street, Stephanie Coup, MD   [facility] Providers Cardiologist:  Tonny Bollman, MD     Referring MD: Casper Harrison, Stephanie Coup, *   History of Present Illness:    Elizabeth Everett is a 89 y.o. female with a medical history significant for atrial fibrillation, referred for consultation regarding AF management.     I discussed the use of AI scribe software for clinical note transcription with the patient, who gave verbal consent to proceed.  The patient, with a history of atrial fibrillation (AFib), was previously on amiodarone. However, this was discontinued due to concerns of potential lung toxicity. [name] husband confirms that a pulmonary specialist had identified some scarring in the patient's lungs, but it was unclear whether this was originally due to the amiodarone.  ZIO monitor showed 100 send A-fib burden with marginally controlled rates, average about 100 bpm.  The patient is currently on a beta blocker (metoprolol), which was recently doubled in dose by Dr. Excell Seltzer.  Her rates are now controlled, typically in the 90s or lower on checks at home.  She is largely asymptomatic. The patient experiences palpitations, particularly in the morning, but is able to manage these by coughing. [name] husband expressed interest in resuming amiodarone but is interested in hearing the risks versus benefits.  The patient is also on Eliquis for stroke prevention.     Today, she reports that she is well.  She does not have any shortness of breath, fatigue.  She has occasional palpitations which appear to resolve after she coughs.  EKGs/Labs/Other Studies Reviewed Today:     Echocardiogram:  TTE November 2024 EF 50 to 55%.  Normal RV function.  Severe bilateral atrial dilation.   Monitors:  Zio 8-day 08/20/2023 --  my interpretation 100% atrial fibrillation.   Heart rate 68-188 beats minute, average 101 bpm    EKG:   EKG Interpretation Date/Time:  Wednesday October 04 2023 08:53:13 EST Ventricular Rate:  103 PR Interval:    QRS Duration:  84 QT Interval:  340 QTC Calculation: 445 R Axis:   55  Text Interpretation: Atrial fibrillation with rapid ventricular response Septal infarct , age undetermined ST & T wave abnormality, consider inferolateral ischemia When compared with ECG of 14-Dec-2016 16:18, AF with RVR has replaced sinus bradycardia Confirmed by York Pellant [phone]) on 10/04/2023 9:07:58 AM     Physical Exam:    VS:  BP 132/70   Pulse (!) 103   Ht 4\' 11"  (1.499 m)   Wt 136 lb (61.7 kg)   SpO2 97%   BMI 27.47 kg/m     Wt Readings from Last 3 Encounters:  10/04/23 136 lb (61.7 kg)  09/01/23 139 lb 12.8 oz (63.4 kg)  04/25/23 139 lb (63 kg)     GEN: Elderly and frail but well nourished, well developed in no acute distress CARDIAC: iRRR, no murmurs, rubs, gallops RESPIRATORY:  Normal work of breathing MUSCULOSKELETAL: no edema    ASSESSMENT & PLAN:     Persistent atrial fibrillation Failed amiodarone due to concern for lung toxicity She is interested in pursuing options for rhythm control CrCl is 35 mL/min at best I do not think she is a good candidate for Tikosyn or ablation.  I think she would have minimal benefit from these interventions despite higher than normal risk due to age and renal function.  I think the best path forward is rate control Continue metoprolol 100 mg p.o. 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 Risk outweighs benefit at this point      Signed, Maurice Small, MD  10/04/2023 9:35 AM    [facility]

## 2023-10-04 ENCOUNTER — Ambulatory Visit: Payer: Medicare Other | Attending: Cardiovascular Disease | Admitting: Cardiovascular Disease

## 2023-10-04 VITALS — BP 132/70 | HR 103 | Ht 59.0 in | Wt 136.0 lb

## 2023-10-04 DIAGNOSIS — I4819 Other persistent atrial fibrillation: Secondary | ICD-10-CM

## 2023-10-04 NOTE — Patient Instructions (Signed)
Medication Instructions:  Your physician recommends that you continue on your current medications as directed. Please refer to the Current Medication list given to you today. *If you need a refill on your cardiac medications before your next appointment, please call your pharmacy*  Follow-Up: At [facility], you and your health needs are our priority.  As part of our continuing mission to provide you with exceptional heart care, we have created designated Provider Care Teams.  These Care Teams include your primary Cardiologist (physician) and Advanced Practice Providers (APPs -  Physician Assistants and Nurse Practitioners) who all work together to provide you with the care you need, when you need it.  We recommend signing up for the patient portal called "MyChart".  Sign up information is provided on this After Visit Summary.  MyChart is used to connect with patients for Virtual Visits (Telemedicine).  Patients are able to view lab/test results, encounter notes, upcoming appointments, etc.  Non-urgent messages can be sent to your provider as well.   To learn more about what you can do with MyChart, go to [email or web address].    Your next appointment:   12/11/23  Provider:   Tonny Bollman, MD

## 2023-11-02 ENCOUNTER — Other Ambulatory Visit: Payer: Self-pay | Admitting: *Deleted

## 2023-11-02 DIAGNOSIS — I480 Paroxysmal atrial fibrillation: Secondary | ICD-10-CM

## 2023-11-02 MED ORDER — APIXABAN 5 MG PO TABS: 5.0000 mg | ORAL_TABLET | Freq: Two times a day (BID) | ORAL | 1 refills | Status: DC

## 2023-11-02 NOTE — Telephone Encounter (Signed)
 Prescription refill request for Eliquis received. Indication: PAF Last office visit: 10/04/23  A Mealor MD Scr: 1.17 on 12/20/22  Epic Age: 89 Weight: 61.7kg  Based on above findings Eliquis 5mg  twice daily is the appropriate dose.  Refill approved.

## 2023-11-24 DIAGNOSIS — D49512 Neoplasm of unspecified behavior of left kidney: Secondary | ICD-10-CM | POA: Diagnosis not present

## 2023-11-29 ENCOUNTER — Other Ambulatory Visit: Payer: Self-pay | Admitting: Cardiovascular Disease

## 2023-11-29 DIAGNOSIS — R002 Palpitations: Secondary | ICD-10-CM

## 2023-11-29 DIAGNOSIS — I480 Paroxysmal atrial fibrillation: Secondary | ICD-10-CM

## 2023-11-29 MED ORDER — PANTOPRAZOLE SODIUM 40 MG PO TBEC: 40.0000 mg | DELAYED_RELEASE_TABLET | Freq: Every day | ORAL | 2 refills | Status: DC

## 2023-12-07 ENCOUNTER — Encounter: Payer: Self-pay | Admitting: *Deleted

## 2023-12-11 ENCOUNTER — Encounter: Payer: Self-pay | Admitting: Cardiovascular Disease

## 2023-12-11 ENCOUNTER — Ambulatory Visit: Payer: Medicare Other | Attending: Cardiovascular Disease | Admitting: Cardiovascular Disease

## 2023-12-11 VITALS — BP 128/58 | HR 88 | Ht 60.0 in | Wt 133.0 lb

## 2023-12-11 DIAGNOSIS — E782 Mixed hyperlipidemia: Secondary | ICD-10-CM | POA: Insufficient documentation

## 2023-12-11 DIAGNOSIS — I4819 Other persistent atrial fibrillation: Secondary | ICD-10-CM | POA: Diagnosis not present

## 2023-12-11 DIAGNOSIS — I10 Essential (primary) hypertension: Secondary | ICD-10-CM | POA: Insufficient documentation

## 2023-12-11 NOTE — Patient Instructions (Signed)
 Follow-Up: At [facility], you and your health needs are our priority.  As part of our continuing mission to provide you with exceptional heart care, we have created designated Provider Care Teams.  These Care Teams include your primary Cardiologist (physician) and Advanced Practice Providers (APPs -  Physician Assistants and Nurse Practitioners) who all work together to provide you with the care you need, when you need it.  We recommend signing up for the patient portal called "MyChart".  Sign up information is provided on this After Visit Summary.  MyChart is used to connect with patients for Virtual Visits (Telemedicine).  Patients are able to view lab/test results, encounter notes, upcoming appointments, etc.  Non-urgent messages can be sent to your provider as well.   To learn more about what you can do with MyChart, go to [email or web address].    Your next appointment:   1 year(s)  Provider:   Tonny Bollman, MD     Other Instructions   1st Floor: - Lobby - Registration  - Pharmacy  - Lab - Cafe  2nd Floor: - PV Lab - Diagnostic Testing (echo, CT, nuclear med)  3rd Floor: - Vacant  4th Floor: - TCTS (cardiothoracic surgery) - AFib Clinic - Structural Heart Clinic - Vascular Surgery  - Vascular Ultrasound  5th Floor: - HeartCare Cardiology (general and EP) - Clinical Pharmacy for coumadin, hypertension, lipid, weight-loss medications, and med management appointments    Valet parking services will be available as well.

## 2023-12-11 NOTE — Progress Notes (Signed)
 Cardiology Office Note:    Date:  12/11/2023   ID:  KYNDRA EICK, DOB 1932-07-16, MRN [id number]  PCP:  Street, Renford Cartwright, MD   [facility] Providers Cardiologist:  Arnoldo Lapping, MD     Referring MD: Street, Renford Cartwright, *   Chief Complaint  Patient presents with   Atrial Fibrillation    History of Present Illness:    Elizabeth Everett is a 89 y.o. female with a hx of atrial fibrillation and hypertension, presenting for follow-up evaluation. The patient has longstanding history of heart palpitations. She has previously been on amiodarone  for atrial fibrillation but there was concern about development of mild interstitial lung disease and this was discontinued. She has gone on to develop permanent atrial fibrillation.   She's here with her husband, Elizabeth Everett, today. She's developed significant problems with her balance.  She has lost weight and reports reduced appetite.  She actually states that she feels pretty well.  She denies chest pain, chest pressure, or shortness of breath.  She has occasional heart palpitations that she notices when she is lying down at night.  Since I saw her last, she was referred to EP and felt to be best treated with continued rate control.  Current Medications: Current Meds  Medication Sig   apixaban  (ELIQUIS ) 5 MG TABS tablet Take 1 tablet (5 mg total) by mouth 2 (two) times daily.   b complex vitamins capsule Take 1 capsule by mouth daily.   cholecalciferol (VITAMIN D3) 25 MCG (1000 UNIT) tablet Take 1,000 Units by mouth daily.   furosemide  (LASIX ) 20 MG tablet Take 1 tablet by mouth daily. (Patient taking differently: Take 20 mg by mouth daily. Per patient taking  40 MG every other day and may 20 mg when not having swelling.)   irbesartan  (AVAPRO ) 150 MG tablet Take 1/2 tablet by mouth every evening.   levothyroxine  (SYNTHROID ) 50 MCG tablet Take 50 mcg by mouth daily before breakfast.   metoprolol  tartrate (LOPRESSOR ) 50 MG tablet Take 1  tablet (50 mg total) by mouth 2 (two) times daily.   Multiple Vitamin (MULTIVITAMIN) capsule Take 1 capsule by mouth daily.   Multiple Vitamins-Minerals (EYE VITAMINS PO) Take 1 tablet by mouth in the morning and at bedtime.   pantoprazole  (PROTONIX ) 40 MG tablet Take 1 tablet (40 mg total) by mouth daily.   potassium chloride  (KLOR-CON  M) 10 MEQ tablet Take 1 tablet by mouth every other day.   simvastatin  (ZOCOR ) 20 MG tablet Take 1 tablet by mouth daily.   Vitamin E 670 MG (1000 UT) CAPS Take by mouth.     Allergies:   Evista [raloxifene], Morphine, Statins, Erythromycin, and Gentamicin   ROS:   Please see the history of present illness.    All other systems reviewed and are negative.  EKGs/Labs/Other Studies Reviewed:    The following studies were reviewed today: Cardiac Studies & Procedures      ECHOCARDIOGRAM  ECHOCARDIOGRAM COMPLETE 09/14/2023  Narrative ECHOCARDIOGRAM REPORT    Patient Name:   Elizabeth Everett Date of Exam: 09/14/2023 Medical Rec #:  [id number]      Height:       59.0 in Accession #:    [id number]     Weight:       139.8 lb Date of Birth:  July 20, 1932      BSA:          1.584 m Patient Age:    62 years  BP:           136/90 mmHg Patient Gender: F              HR:           87 bpm. Exam Location:  [address]  Procedure: 2D Echo, Cardiac Doppler and Color Doppler  Indications:    I48.19 Atrial fibrillation  History:        Patient has prior history of Echocardiogram examinations. Arrythmias:Atrial Fibrillation; Risk Factors:Hypertension and Dyslipidemia. Previous echo revealed LVEF 65% severe LAE.  Sonographer:    Donnita Gales BA, RDCS Referring Phys: [phone] Tomeko [place]  IMPRESSIONS   1. Left ventricular ejection fraction, by estimation, is 50 to 55%. The left ventricle has low normal function. The left ventricle has no regional wall motion abnormalities. Left ventricular diastolic parameters are indeterminate. 2. Right ventricular  systolic function is normal. The right ventricular size is mildly enlarged. There is mildly elevated pulmonary artery systolic pressure. The estimated right ventricular systolic pressure is 45.0 mmHg. 3. Left atrial size was severely dilated. 4. Right atrial size was severely dilated. 5. The mitral valve is degenerative. Mild mitral valve regurgitation. No evidence of mitral stenosis. 6. The tricuspid valve is abnormal. Tricuspid valve regurgitation is moderate. 7. The aortic valve is tricuspid. There is moderate calcification of the aortic valve. Aortic valve regurgitation is not visualized. No aortic stenosis is present. 8. The inferior vena cava is normal in size with greater than 50% respiratory variability, suggesting right atrial pressure of 3 mmHg. 9. The patient was in atrial fibrillation.  FINDINGS Left Ventricle: Left ventricular ejection fraction, by estimation, is 50 to 55%. The left ventricle has low normal function. The left ventricle has no regional wall motion abnormalities. The left ventricular internal cavity size was normal in size. There is no left ventricular hypertrophy. Left ventricular diastolic parameters are indeterminate.  Right Ventricle: The right ventricular size is mildly enlarged. No increase in right ventricular wall thickness. Right ventricular systolic function is normal. There is mildly elevated pulmonary artery systolic pressure. The tricuspid regurgitant velocity is 3.24 m/s, and with an assumed right atrial pressure of 3 mmHg, the estimated right ventricular systolic pressure is 45.0 mmHg.  Left Atrium: Left atrial size was severely dilated.  Right Atrium: Right atrial size was severely dilated.  Pericardium: There is no evidence of pericardial effusion.  Mitral Valve: The mitral valve is degenerative in appearance. There is moderate calcification of the mitral valve leaflet(s). Mild mitral annular calcification. Mild mitral valve regurgitation. No  evidence of mitral valve stenosis.  Tricuspid Valve: The tricuspid valve is abnormal. Tricuspid valve regurgitation is moderate.  Aortic Valve: The aortic valve is tricuspid. There is moderate calcification of the aortic valve. Aortic valve regurgitation is not visualized. No aortic stenosis is present.  Pulmonic Valve: The pulmonic valve was normal in structure. Pulmonic valve regurgitation is trivial.  Aorta: The aortic root is normal in size and structure.  Venous: The inferior vena cava is normal in size with greater than 50% respiratory variability, suggesting right atrial pressure of 3 mmHg.  IAS/Shunts: No atrial level shunt detected by color flow Doppler.   LEFT VENTRICLE PLAX 2D LVIDd:         5.00 cm   Diastology LVIDs:         4.17 cm   LV e' medial:  7.98 cm/s LV PW:         1.20 cm   LV e' lateral: 11.06 cm/s LV IVS:  0.90 cm LVOT diam:     1.90 cm LV SV:         40 LV SV Index:   25 LVOT Area:     2.84 cm   RIGHT VENTRICLE             IVC RV Basal diam:  4.20 cm     IVC diam: 1.30 cm RV Mid diam:    2.90 cm RV S prime:     10.70 cm/s TAPSE (M-mode): 2.4 cm  LEFT ATRIUM             Index        RIGHT ATRIUM           Index LA diam:        4.80 cm 3.03 cm/m   RA Area:     27.10 cm LA Vol (A2C):   83.7 ml 52.84 ml/m  RA Volume:   98.20 ml  62.00 ml/m LA Vol (A4C):   85.0 ml 53.66 ml/m LA Biplane Vol: 85.6 ml 54.04 ml/m AORTIC VALVE             PULMONIC VALVE LVOT Vmax:   81.73 cm/s  PR End Diast Vel: 12.96 msec LVOT Vmean:  55.200 cm/s LVOT VTI:    0.140 m  AORTA Ao Root diam: 2.80 cm Ao Asc diam:  3.00 cm  TRICUSPID VALVE TR Peak grad:   42.0 mmHg TR Vmax:        324.00 cm/s  SHUNTS Systemic VTI:  0.14 m Systemic Diam: 1.90 cm  Dalton McleanMD Electronically signed by Archer Bear Signature Date/Time: 09/14/2023/10:28:54 AM    Final   MONITORS  LONG TERM MONITOR (3-14 DAYS) 08/21/2023  Narrative Patch Wear Time:  8 days  and 4 hours (2024-10-07T13:57:45-0400 to 2024-10-15T18:23:51-0400)  Atrial Fibrillation occurred continuously (100% burden), ranging from 68-188 bpm (avg of 101 bpm). Isolated VEs were occasional (1.1%, 11085), VE Couplets were rare (<1.0%, 470), and VE Triplets were rare (<1.0%, 5).  SUMMARY: atrial fibrillation is present throughout. The average HR is 101 bpm and there are occasional PVC's present. No other significant arrhythmia.           EKG:        Recent Labs: 12/20/2022: ALT 14; BUN 23; Creatinine, Ser 1.17; Hemoglobin 12.1; Platelets 238; Potassium 4.6; Sodium 143; TSH 2.550  Recent Lipid Panel    Component Value Date/Time   CHOL 177 11/11/2021 1242   TRIG 78 11/11/2021 1242   HDL 67 11/11/2021 1242   CHOLHDL 2.6 11/11/2021 1242   LDLCALC 96 11/11/2021 1242     Risk Assessment/Calculations:    CHA2DS2-VASc Score = 4   This indicates a 4.8% annual risk of stroke. [name] score is based upon: CHF History: 0 HTN History: 1 Diabetes History: 0 Stroke History: 0 Vascular Disease History: 0 Age Score: 2 Gender Score: 1               Physical Exam:    VS:  BP (!) 128/58 (BP Location: Right Arm, Patient Position: Sitting, Cuff Size: Normal)   Pulse 88   Ht 5' (1.524 m)   Wt 133 lb (60.3 kg)   SpO2 92%   BMI 25.97 kg/m     Wt Readings from Last 3 Encounters:  12/11/23 133 lb (60.3 kg)  10/04/23 136 lb (61.7 kg)  09/01/23 139 lb 12.8 oz (63.4 kg)     GEN: Very pleasant elderly woman in no acute distress HEENT: Normal NECK:  No JVD; No carotid bruits LYMPHATICS: No lymphadenopathy CARDIAC: Irregularly irregular, no murmurs, rubs, gallops RESPIRATORY:  Clear to auscultation without rales, wheezing or rhonchi  ABDOMEN: Soft, non-tender, non-distended MUSCULOSKELETAL:  No edema; No deformity  SKIN: Warm and dry NEUROLOGIC:  Alert and oriented x 3 PSYCHIATRIC:  Normal affect   Assessment & Plan Persistent atrial fibrillation (HCC) Previously  rhythm controlled with amiodarone .  This was discontinued because of mild changes of suspected lung toxicity.  She is now managed with a rate control strategy and appears to be doing fine with this.  Continue metoprolol  for rate control and apixaban  for oral anticoagulation.  Will need to continue to watch her fall risk carefully.  She has become pretty unsteady on her feet.  At this point the risk/benefit of oral anticoagulation seems to favor continuation of apixaban . Essential hypertension Blood pressure well-controlled on current medical therapy.  No changes recommended today.  Continue metoprolol .  Continue irbesartan . Mixed hyperlipidemia Treated with low-dose simvastatin .  I do not have a recent LDL level but her cholesterol is 119 and her HDL is 50, so her LDL is clearly at goal at this time.            Medication Adjustments/Labs and Tests Ordered: Current medicines are reviewed at length with the patient today.  Concerns regarding medicines are outlined above.  No orders of the defined types were placed in this encounter.  No orders of the defined types were placed in this encounter.   Patient Instructions  Follow-Up: At [facility], you and your health needs are our priority.  As part of our continuing mission to provide you with exceptional heart care, we have created designated Provider Care Teams.  These Care Teams include your primary Cardiologist (physician) and Advanced Practice Providers (APPs -  Physician Assistants and Nurse Practitioners) who all work together to provide you with the care you need, when you need it.  We recommend signing up for the patient portal called "MyChart".  Sign up information is provided on this After Visit Summary.  MyChart is used to connect with patients for Virtual Visits (Telemedicine).  Patients are able to view lab/test results, encounter notes, upcoming appointments, etc.  Non-urgent messages can be sent to your provider as well.    To learn more about what you can do with MyChart, go to [email or web address].    Your next appointment:   1 year(s)  Provider:   Arnoldo Lapping, MD     Other Instructions   1st Floor: - Lobby - Registration  - Pharmacy  - Lab - Cafe  2nd Floor: - PV Lab - Diagnostic Testing (echo, CT, nuclear med)  3rd Floor: - Vacant  4th Floor: - TCTS (cardiothoracic surgery) - AFib Clinic - Structural Heart Clinic - Vascular Surgery  - Vascular Ultrasound  5th Floor: - HeartCare Cardiology (general and EP) - Clinical Pharmacy for coumadin, hypertension, lipid, weight-loss medications, and med management appointments    Valet parking services will be available as well.        Signed, Arnoldo Lapping, MD  12/11/2023 1:40 PM    [facility]

## 2023-12-18 ENCOUNTER — Encounter: Payer: Self-pay | Admitting: Cardiovascular Disease

## 2023-12-18 DIAGNOSIS — I480 Paroxysmal atrial fibrillation: Secondary | ICD-10-CM

## 2023-12-18 DIAGNOSIS — R002 Palpitations: Secondary | ICD-10-CM

## 2023-12-18 MED ORDER — POTASSIUM CHLORIDE CRYS ER 10 MEQ PO TBCR: 10.0000 meq | EXTENDED_RELEASE_TABLET | ORAL | 3 refills | Status: DC

## 2023-12-18 MED ORDER — SIMVASTATIN 20 MG PO TABS: 20.0000 mg | ORAL_TABLET | Freq: Every day | ORAL | 3 refills | Status: DC

## 2023-12-18 MED ORDER — PANTOPRAZOLE SODIUM 40 MG PO TBEC: 40.0000 mg | DELAYED_RELEASE_TABLET | Freq: Every day | ORAL | 3 refills | Status: AC

## 2023-12-29 ENCOUNTER — Other Ambulatory Visit: Payer: Self-pay | Admitting: Cardiovascular Disease

## 2024-02-01 DIAGNOSIS — Z85820 Personal history of malignant melanoma of skin: Secondary | ICD-10-CM | POA: Diagnosis not present

## 2024-02-01 DIAGNOSIS — Z85828 Personal history of other malignant neoplasm of skin: Secondary | ICD-10-CM | POA: Diagnosis not present

## 2024-02-01 DIAGNOSIS — L578 Other skin changes due to chronic exposure to nonionizing radiation: Secondary | ICD-10-CM | POA: Diagnosis not present

## 2024-02-01 DIAGNOSIS — L821 Other seborrheic keratosis: Secondary | ICD-10-CM | POA: Diagnosis not present

## 2024-02-01 DIAGNOSIS — D1801 Hemangioma of skin and subcutaneous tissue: Secondary | ICD-10-CM | POA: Diagnosis not present

## 2024-02-01 DIAGNOSIS — L570 Actinic keratosis: Secondary | ICD-10-CM | POA: Diagnosis not present

## 2024-02-01 DIAGNOSIS — Z08 Encounter for follow-up examination after completed treatment for malignant neoplasm: Secondary | ICD-10-CM | POA: Diagnosis not present

## 2024-02-01 DIAGNOSIS — L820 Inflamed seborrheic keratosis: Secondary | ICD-10-CM | POA: Diagnosis not present

## 2024-02-06 ENCOUNTER — Ambulatory Visit (HOSPITAL_BASED_OUTPATIENT_CLINIC_OR_DEPARTMENT_OTHER): Admission: EM | Admit: 2024-02-06 | Discharge: 2024-02-06 | Disposition: A | Discharge status: Home / Self Care

## 2024-02-06 ENCOUNTER — Encounter (HOSPITAL_BASED_OUTPATIENT_CLINIC_OR_DEPARTMENT_OTHER): Payer: Self-pay | Admitting: Emergency Medicine

## 2024-02-06 ENCOUNTER — Other Ambulatory Visit: Payer: Self-pay

## 2024-02-06 ENCOUNTER — Emergency Department (HOSPITAL_BASED_OUTPATIENT_CLINIC_OR_DEPARTMENT_OTHER)
Admission: EM | Admit: 2024-02-06 | Discharge: 2024-02-06 | Disposition: A | Discharge status: Home / Self Care | Attending: Emergency Medicine | Admitting: Emergency Medicine

## 2024-02-06 DIAGNOSIS — X58XXXA Exposure to other specified factors, initial encounter: Secondary | ICD-10-CM | POA: Insufficient documentation

## 2024-02-06 DIAGNOSIS — S8991XA Unspecified injury of right lower leg, initial encounter: Secondary | ICD-10-CM

## 2024-02-06 DIAGNOSIS — T148XXA Other injury of unspecified body region, initial encounter: Secondary | ICD-10-CM

## 2024-02-06 DIAGNOSIS — M7981 Nontraumatic hematoma of soft tissue: Secondary | ICD-10-CM | POA: Diagnosis not present

## 2024-02-06 DIAGNOSIS — S8011XA Contusion of right lower leg, initial encounter: Secondary | ICD-10-CM | POA: Diagnosis not present

## 2024-02-06 DIAGNOSIS — Z7901 Long term (current) use of anticoagulants: Secondary | ICD-10-CM | POA: Diagnosis not present

## 2024-02-06 LAB — BASIC METABOLIC PANEL WITH GFR
Anion gap: 9 (ref 5–15)
BUN: 33 mg/dL — ABNORMAL HIGH (ref 8–23)
CO2: 26 mmol/L (ref 22–32)
Calcium: 9.6 mg/dL (ref 8.9–10.3)
Chloride: 104 mmol/L (ref 98–111)
Creatinine, Ser: 1.23 mg/dL — ABNORMAL HIGH (ref 0.44–1.00)
GFR, Estimated: 42 mL/min — ABNORMAL LOW (ref >60)
Glucose, Bld: 120 mg/dL — ABNORMAL HIGH (ref 70–99)
Potassium: 4.1 mmol/L (ref 3.5–5.1)
Sodium: 139 mmol/L (ref 135–145)

## 2024-02-06 LAB — CBC WITH DIFFERENTIAL/PLATELET
Abs Immature Granulocytes: 0.01 10*3/uL (ref 0.00–0.07)
Basophils Absolute: 0 10*3/uL (ref 0.0–0.1)
Basophils Relative: 1 %
Eosinophils Absolute: 0.1 10*3/uL (ref 0.0–0.5)
Eosinophils Relative: 1 %
HCT: 38.8 % (ref 36.0–46.0)
Hemoglobin: 12.4 g/dL (ref 12.0–15.0)
Immature Granulocytes: 0 %
Lymphocytes Relative: 15 %
Lymphs Abs: 1 10*3/uL (ref 0.7–4.0)
MCH: 29.9 pg (ref 26.0–34.0)
MCHC: 32 g/dL (ref 30.0–36.0)
MCV: 93.5 fL (ref 80.0–100.0)
Monocytes Absolute: 0.8 10*3/uL (ref 0.1–1.0)
Monocytes Relative: 11 %
Neutro Abs: 5 10*3/uL (ref 1.7–7.7)
Neutrophils Relative %: 72 %
Platelets: 182 10*3/uL (ref 150–400)
RBC: 4.15 MIL/uL (ref 3.87–5.11)
RDW: 14.5 % (ref 11.5–15.5)
WBC: 7 10*3/uL (ref 4.0–10.5)
nRBC: 0 % (ref 0.0–0.2)

## 2024-02-06 MED ORDER — LIDOCAINE-EPINEPHRINE 1 %-1:100000 IJ SOLN
30.0000 mL | Freq: Once | INTRAMUSCULAR | Status: AC
  Administered 2024-02-06: 30 mL via INTRADERMAL
  Filled 2024-02-06: qty 2

## 2024-02-06 NOTE — Discharge Instructions (Addendum)
 INCISION AND DRAINAGE WOUND CARE Please have wound rechecked 2-3 days or sooner if you have concerns.  Keep area clean and dry.  Keep the bandage in place until you follow-up with orthopedics for wound evaluation Seek medical careif you experience any of the following signs of infection: Swelling, redness, pus drainage, streaking, fever >101.0 F, or severe pain out of proportion Seek care if you experience excessive bleeding that does not stop after 20 minutes of constant, firm Pressure directly to the wound. Ice and elevated the leg.

## 2024-02-06 NOTE — ED Notes (Signed)
 Patient brought to exam room. Visible dressing to right lower leg saturated with blood. Patient reports bumping into side table night before and hematoma developed to right lower leg. At some point during the night, patient reports waking to find bedding, floor, and clothing saturate with blood. Blood "gushing" from right lower leg wound. Wound currently wrapped with ace wrap and washcloth.  Dressing removed. Large blood filled hematoma visible to right lower leg. Protruding approx 2 inches and extending approx 3.5 inches in diameter. Small open areas to sides of wound with blood oozing from wound. Patient currently taking Eliquis 5mg  bid.  Spouse reports morning dose given to patient. Pulse palpable to right foot. Skin p/w/d. Pressure held with 4x4's.  Dahlia Byes, FNP brought to room to assess.  After discussion with patient and patient's spouse, recommendation is that patient go to ER where alternative methods to stop bleeding can be used.  Pressure dressing of 4x4's and coban gauze applied.

## 2024-02-06 NOTE — ED Provider Notes (Signed)
 [facility] Provider Note   CSN: [id number] Arrival date & time: 02/06/24  1409     History  Chief Complaint  Patient presents with   Wound Check    Elizabeth Everett is a 89 y.o. female with a past medical history of A-fib on Eliquis last dose this morning sent in from urgent care for large ruptured hematoma with active bleeding of the right lower extremity.  Patient states that she banged her leg into a stool yesterday.  She states that she was unable to sleep due to severe pain in her leg.  This morning her pain suddenly improved and she noticed that her recliner was soaked with blood.  Her family member at bedside states that he touched the hematoma and bloods squirted out of it about a foot into the air.  They went to an urgent care prior to arrival and were sent here for further evaluation.   Wound Check       Home Medications Prior to Admission medications   Medication Sig Start Date End Date Taking? Authorizing Provider  apixaban (ELIQUIS) 5 MG TABS tablet Take 1 tablet (5 mg total) by mouth 2 (two) times daily. 11/02/23   Tonny Bollman, MD  b complex vitamins capsule Take 1 capsule by mouth daily.    [provider]  cholecalciferol (VITAMIN D3) 25 MCG (1000 UNIT) tablet Take 1,000 Units by mouth daily.    [provider]  furosemide (LASIX) 20 MG tablet Take 1 tablet by mouth daily. Patient taking differently: Take 20 mg by mouth daily. Per patient taking  40 MG every other day and may 20 mg when not having swelling. 06/06/23   Tonny Bollman, MD  irbesartan Evlyn Kanner) 150 MG tablet Take 1/2 tablet by mouth every evening. 12/29/23   Tonny Bollman, MD  levothyroxine (SYNTHROID) 50 MCG tablet Take 50 mcg by mouth daily before breakfast. 12/07/20   [provider]  metoprolol tartrate (LOPRESSOR) 50 MG tablet Take 1 tablet (50 mg total) by mouth 2 (two) times daily. 09/01/23   Tonny Bollman, MD  Multiple  Vitamin (MULTIVITAMIN) capsule Take 1 capsule by mouth daily.    [provider]  Multiple Vitamins-Minerals (EYE VITAMINS PO) Take 1 tablet by mouth in the morning and at bedtime.    [provider]  pantoprazole (PROTONIX) 40 MG tablet Take 1 tablet (40 mg total) by mouth daily. 12/18/23   Tonny Bollman, MD  potassium chloride (KLOR-CON M) 10 MEQ tablet Take 1 tablet (10 mEq total) by mouth every other day. 12/18/23   Tonny Bollman, MD  simvastatin (ZOCOR) 20 MG tablet Take 1 tablet (20 mg total) by mouth daily. 12/18/23   Tonny Bollman, MD  Vitamin E 670 MG (1000 UT) CAPS Take by mouth.    [provider]      Allergies    Evista [raloxifene], Morphine, Statins, Erythromycin, and Gentamicin    Review of Systems   Review of Systems  Physical Exam Updated Vital Signs BP 122/86   Pulse 86   Temp 97.6 F (36.4 C)   Resp 16   Ht 5' (1.524 m)   Wt 60.3 kg   SpO2 96%   BMI 25.96 kg/m  Physical Exam Vitals and nursing note reviewed.  Constitutional:      General: She is not in acute distress.    Appearance: She is well-developed. She is not diaphoretic.  HENT:     Head: Normocephalic and atraumatic.  Right Ear: External ear normal.     Left Ear: External ear normal.     Nose: Nose normal.     Mouth/Throat:     Mouth: Mucous membranes are moist.  Eyes:     General: No scleral icterus.    Conjunctiva/sclera: Conjunctivae normal.  Cardiovascular:     Rate and Rhythm: Normal rate and regular rhythm.     Heart sounds: Normal heart sounds. No murmur heard.    No friction rub. No gallop.  Pulmonary:     Effort: Pulmonary effort is normal. No respiratory distress.     Breath sounds: Normal breath sounds.  Abdominal:     General: Bowel sounds are normal. There is no distension.     Palpations: Abdomen is soft. There is no mass.     Tenderness: There is no abdominal tenderness. There is no guarding.  Musculoskeletal:     Cervical back: Normal  range of motion.     Comments: R> L LE edema Large tense hematoma ruptured on the lateral side with dark venous oozing.  Medial side is fluctuant and bullous. 2+ DP pulse with normal sensation.  Skin:    General: Skin is warm and dry.  Neurological:     Mental Status: She is alert and oriented to person, place, and time.  Psychiatric:        Behavior: Behavior normal.        ED Results / Procedures / Treatments   Labs (all labs ordered are listed, but only abnormal results are displayed) Labs Reviewed  BASIC METABOLIC PANEL WITH GFR - Abnormal; Notable for the following components:      Result Value   Glucose, Bld 120 (*)    BUN 33 (*)    Creatinine, Ser 1.23 (*)    GFR, Estimated 42 (*)    All other components within normal limits  CBC WITH DIFFERENTIAL/PLATELET    EKG None  Radiology No results found.  Procedures .Incision and Drainage  Date/Time: 02/06/2024 4:51 PM  Performed by: Arthor Captain, PA-C Authorized by: Arthor Captain, PA-C   Consent:    Consent obtained:  Verbal   Consent given by:  Patient   Risks discussed:  Bleeding, incomplete drainage, pain and infection   Alternatives discussed:  No treatment Universal protocol:    Patient identity confirmed:  Arm band Location:    Type:  Hematoma   Size:  11.5 cm x 8 cm   Location:  Lower extremity   Lower extremity location:  Leg   Leg location:  R lower leg Pre-procedure details:    Skin preparation:  Povidone-iodine Anesthesia:    Anesthesia method:  Local infiltration   Local anesthetic:  Lidocaine 1% WITH epi (field block) Procedure type:    Complexity:  Complex Procedure details:    Incision types:  Single straight   Incision depth:  Dermal   Wound management:  Irrigated with saline, extensive cleaning and debrided   Drainage:  Bloody and serosanguinous   Drainage amount:  Copious   Wound treatment:  Wound left open Post-procedure details:    Procedure completion:  Tolerated with  difficulty Comments:     Large hematoma, Majority of clot burden removed. Irrigated. Combat gauze dressing with 1 abd pads, rolled gauze and ace wraps.      Medications Ordered in ED Medications  lidocaine-EPINEPHrine (XYLOCAINE W/EPI) 1 %-1:100000 (with pres) injection 30 mL (30 mLs Intradermal Given by Other 02/06/24 1652)    ED Course/ Medical Decision Making/ A&P  Clinical Course as of 02/06/24 1658  Tue Feb 06, 2024  1528 Case discussed with Dr. Ave Filter of orthopedics.  He states that I should extending open area with 15 or 11 blade and then debride the clot out from underneath the skin, wash it and applied dressing with Ace wrap bandages.  Patient may follow in the office. [AH]  1641 Hemoglobin: 12.4 [AH]  1642 Glucose(!): 120 [AH]  1642 BUN(!): 33 [AH]  1642 Creatinine(!): 1.23 [AH]    Clinical Course User Index [AH] Arthor Captain, PA-C                                 Medical Decision Making Amount and/or Complexity of Data Reviewed Labs: ordered. Decision-making details documented in ED Course.  Risk Prescription drug management.   89 year old female here with ruptured hematoma of the leg.  She is on Eliquis.  I was able to debride the clot and achieve relative hemostasis.  Patient is advised to follow closely with Dr. Ave Filter in the next 48 hours or so for wound recheck.  Discussed bleeding management at home, strict return precautions and outpatient follow-up.  Appropriate for discharge at this time.  I reviewed the patient's labs which show no evidence of significant drop in hemoglobin.  Renal insufficiency at baseline.  Patient does not have any hemodynamic instability.        Final Clinical Impression(s) / ED Diagnoses Final diagnoses:  Hematoma of right lower extremity, initial encounter    Rx / DC Orders ED Discharge Orders     None         Arthor Captain, PA-C 02/06/24 1658    Tegeler, Canary Brim, MD 02/06/24 [phone]

## 2024-02-06 NOTE — Discharge Instructions (Addendum)
 Please go to the ER for further evaluation

## 2024-02-06 NOTE — ED Notes (Signed)
 Pt alert and oriented X 4 at the time of discharge. RR even and unlabored. No acute distress noted. Pt verbalized understanding of discharge instructions as discussed. Pt ambulatory to lobby at time of discharge.

## 2024-02-06 NOTE — ED Triage Notes (Signed)
 Pt sts she bumped into a stool yesterday and developed a hematoma; sts the hematoma burst this morning and she can't stop the bleeding; coban wrap in place to RLE at  this time; takes Eliquis

## 2024-02-06 NOTE — ED Provider Notes (Addendum)
 [facility]    CSN: [id number] Arrival date & time: 02/06/24  1300      History   Chief Complaint No chief complaint on file.   HPI Elizabeth Everett is a 89 y.o. female.   89 year old female presents today with right lower extremity injury and large hematoma.  Reports that she hit her leg on a table yesterday.  Here today with concerns that she is unable to get the bleeding to stop.  She is on Eliquis.     Past Medical History:  Diagnosis Date   Acute respiratory failure with hypoxia and hypercapnia (HCC)    Arthritis    Atrial fibrillation (HCC)    Baker's cyst of knee    right    Bradycardia    Cancer (HCC)    skin cancer; endometrial cancer    Colonic pseudoobstruction    Dizziness    Dysrhythmia    afib    Edema of lower extremity    bilat    GERD (gastroesophageal reflux disease)    Hard of hearing    Heart murmur    History of atrial flutter    History of bronchitis    History of kidney stones    Hypercholesteremia    Hypertension    Macular degeneration    Multiple falls    Pancreatitis    Scoliosis    Shortness of breath dyspnea    can not climb full set of steps without stopping    Toxic metabolic encephalopathy     Patient Active Problem List   Diagnosis Date Noted   S/P reverse total shoulder arthroplasty, 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   S/P right TKA 12/01/2015   Idiopathic acute pancreatitis 06/16/2015    Past Surgical History:  Procedure Laterality Date   CARDIOVERSION     DILATION AND CURETTAGE OF UTERUS     FOOT SURGERY     bone surgery   KNEE ARTHROSCOPY Right    left breast lumpectomy      benign    REVERSE SHOULDER ARTHROPLASTY Left 02/12/2021   Procedure: REVERSE SHOULDER ARTHROPLASTY;  Surgeon: Francena Hanly, MD;  Location: [facility];  Service: Orthopedics;  Laterality: Left;   TONSILLECTOMY     TOTAL KNEE ARTHROPLASTY Right 12/01/2015   Procedure: RIGHT TOTAL KNEE ARTHROPLASTY;  Surgeon: Durene Romans, MD;  Location: [facility];   Service: Orthopedics;  Laterality: Right;   VAGINAL HYSTERECTOMY      OB History   No obstetric history on file.      Home Medications    Prior to Admission medications   Medication Sig Start Date End Date Taking? Authorizing Provider  apixaban (ELIQUIS) 5 MG TABS tablet Take 1 tablet (5 mg total) by mouth 2 (two) times daily. 11/02/23   Tonny Bollman, MD  b complex vitamins capsule Take 1 capsule by mouth daily.    [provider]  cholecalciferol (VITAMIN D3) 25 MCG (1000 UNIT) tablet Take 1,000 Units by mouth daily.    [provider]  furosemide (LASIX) 20 MG tablet Take 1 tablet by mouth daily. Patient taking differently: Take 20 mg by mouth daily. Per patient taking  40 MG every other day and may 20 mg when not having swelling. 06/06/23   Tonny Bollman, MD  irbesartan Evlyn Kanner) 150 MG tablet Take 1/2 tablet by mouth every evening. 12/29/23   Tonny Bollman, MD  levothyroxine (SYNTHROID) 50 MCG tablet Take 50 mcg by mouth daily before breakfast. 12/07/20   [provider]  metoprolol  tartrate (LOPRESSOR) 50 MG tablet Take 1 tablet (50 mg total) by mouth 2 (two) times daily. 09/01/23   Tonny Bollman, MD  Multiple Vitamin (MULTIVITAMIN) capsule Take 1 capsule by mouth daily.    [provider]  Multiple Vitamins-Minerals (EYE VITAMINS PO) Take 1 tablet by mouth in the morning and at bedtime.    [provider]  pantoprazole (PROTONIX) 40 MG tablet Take 1 tablet (40 mg total) by mouth daily. 12/18/23   Tonny Bollman, MD  potassium chloride (KLOR-CON M) 10 MEQ tablet Take 1 tablet (10 mEq total) by mouth every other day. 12/18/23   Tonny Bollman, MD  simvastatin (ZOCOR) 20 MG tablet Take 1 tablet (20 mg total) by mouth daily. 12/18/23   Tonny Bollman, MD  Vitamin E 670 MG (1000 UT) CAPS Take by mouth.    [provider]    Family History Family History  Problem Relation Age of Onset   Macular degeneration Mother    Lymphoma  Father    Macular degeneration Father    Lung cancer Paternal Uncle        smoked    Social History Social History   Tobacco Use   Smoking status: Never    Passive exposure: Past   Smokeless tobacco: Never  Vaping Use   Vaping status: Never Used  Substance Use Topics   Alcohol use: Yes    Alcohol/week: 0.0 standard drinks of alcohol    Comment: rare glass of wine    Drug use: No     Allergies   Evista [raloxifene], Morphine, Statins, Erythromycin, and Gentamicin   Review of Systems Review of Systems   Physical Exam Triage Vital Signs ED Triage Vitals  Encounter Vitals Group     BP      Systolic BP Percentile      Diastolic BP Percentile      Pulse      Resp      Temp      Temp src      SpO2      Weight      Height      Head Circumference      Peak Flow      Pain Score      Pain Loc      Pain Education      Exclude from Growth Chart    No data found.  Updated Vital Signs There were no vitals taken for this visit.  Visual Acuity Right Eye Distance:   Left Eye Distance:   Bilateral Distance:    Right Eye Near:   Left Eye Near:    Bilateral Near:     Physical Exam Skin:         Comments: Very large hematoma that is oozing to right lower extremity       UC Treatments / Results  Labs (all labs ordered are listed, but only abnormal results are displayed) Labs Reviewed - No data to display  EKG   Radiology No results found.  Procedures Procedures (including critical care time)  Medications Ordered in UC Medications - No data to display  Initial Impression / Assessment and Plan / UC Course  I have reviewed the triage vital signs and the nursing notes.  Pertinent labs & imaging results that were available during my care of the patient were reviewed by me and considered in my medical decision making (see chart for details).     Hematoma due to leg injury and patient on blood  thinners. Very large hematoma that is oozing to right  lower extremity.  Recommend go to the ER for further management. Final Clinical Impressions(s) / UC Diagnoses   Final diagnoses:  Hematoma  Hx of long term use of blood thinners  Injury of right lower extremity, initial encounter     Discharge Instructions      Please go to the ER for further evaluation     ED Prescriptions   None    PDMP not reviewed this encounter.   Janace Aris, FNP 02/06/24 1330    Janace Aris, FNP 02/06/24 1331

## 2024-02-07 DIAGNOSIS — S8011XA Contusion of right lower leg, initial encounter: Secondary | ICD-10-CM | POA: Diagnosis not present

## 2024-03-11 DIAGNOSIS — S8011XA Contusion of right lower leg, initial encounter: Secondary | ICD-10-CM | POA: Diagnosis not present

## 2024-04-15 ENCOUNTER — Telehealth: Payer: Self-pay | Admitting: Cardiovascular Disease

## 2024-04-15 NOTE — Telephone Encounter (Signed)
 RX sent in for a year on 12/18/23.

## 2024-04-15 NOTE — Telephone Encounter (Signed)
*  STAT* If patient is at the pharmacy, call can be transferred to refill team.   1. Which medications need to be refilled? (please list name of each medication and dose if known)   pantoprazole  (PROTONIX ) 40 MG tablet    2. Which pharmacy/location (including street and city if local pharmacy) is medication to be sent to? [facility] - [place], [place] - [address] Phone: [phone]  Fax: [phone]     3. Do they need a 30 day or 90 day supply? 90

## 2024-04-23 ENCOUNTER — Other Ambulatory Visit: Payer: Self-pay | Admitting: Cardiovascular Disease

## 2024-04-23 DIAGNOSIS — I480 Paroxysmal atrial fibrillation: Secondary | ICD-10-CM

## 2024-04-23 NOTE — Telephone Encounter (Signed)
 Prescription refill request for Eliquis  received. Indication:afib Last office visit:2/25 Scr:1.23  4/25 Age: 89 Weight:60.3  kg  Prescription refilled

## 2024-06-26 DIAGNOSIS — R070 Pain in throat: Secondary | ICD-10-CM | POA: Diagnosis not present

## 2024-07-18 DIAGNOSIS — H40003 Preglaucoma, unspecified, bilateral: Secondary | ICD-10-CM | POA: Diagnosis not present

## 2024-07-18 DIAGNOSIS — H353232 Exudative age-related macular degeneration, bilateral, with inactive choroidal neovascularization: Secondary | ICD-10-CM | POA: Diagnosis not present

## 2024-07-18 DIAGNOSIS — H43813 Vitreous degeneration, bilateral: Secondary | ICD-10-CM | POA: Diagnosis not present

## 2024-08-08 DIAGNOSIS — Z23 Encounter for immunization: Secondary | ICD-10-CM | POA: Diagnosis not present

## 2024-08-11 ENCOUNTER — Other Ambulatory Visit: Payer: Self-pay | Admitting: Cardiovascular Disease

## 2024-08-15 DIAGNOSIS — Z85828 Personal history of other malignant neoplasm of skin: Secondary | ICD-10-CM | POA: Diagnosis not present

## 2024-08-15 DIAGNOSIS — L821 Other seborrheic keratosis: Secondary | ICD-10-CM | POA: Diagnosis not present

## 2024-08-15 DIAGNOSIS — L578 Other skin changes due to chronic exposure to nonionizing radiation: Secondary | ICD-10-CM | POA: Diagnosis not present

## 2024-08-15 DIAGNOSIS — Z85820 Personal history of malignant melanoma of skin: Secondary | ICD-10-CM | POA: Diagnosis not present

## 2024-08-15 DIAGNOSIS — L820 Inflamed seborrheic keratosis: Secondary | ICD-10-CM | POA: Diagnosis not present

## 2024-08-15 DIAGNOSIS — D1801 Hemangioma of skin and subcutaneous tissue: Secondary | ICD-10-CM | POA: Diagnosis not present

## 2024-08-15 DIAGNOSIS — Z08 Encounter for follow-up examination after completed treatment for malignant neoplasm: Secondary | ICD-10-CM | POA: Diagnosis not present

## 2024-08-15 DIAGNOSIS — L814 Other melanin hyperpigmentation: Secondary | ICD-10-CM | POA: Diagnosis not present

## 2024-08-16 DIAGNOSIS — Z23 Encounter for immunization: Secondary | ICD-10-CM | POA: Diagnosis not present

## 2024-08-19 DIAGNOSIS — E039 Hypothyroidism, unspecified: Secondary | ICD-10-CM | POA: Diagnosis not present

## 2024-08-19 DIAGNOSIS — N1831 Chronic kidney disease, stage 3a: Secondary | ICD-10-CM | POA: Diagnosis not present

## 2024-08-19 DIAGNOSIS — E785 Hyperlipidemia, unspecified: Secondary | ICD-10-CM | POA: Diagnosis not present

## 2024-08-19 DIAGNOSIS — Z131 Encounter for screening for diabetes mellitus: Secondary | ICD-10-CM | POA: Diagnosis not present

## 2024-08-19 DIAGNOSIS — E538 Deficiency of other specified B group vitamins: Secondary | ICD-10-CM | POA: Diagnosis not present

## 2024-09-04 DIAGNOSIS — Z0000 Encounter for general adult medical examination without abnormal findings: Secondary | ICD-10-CM | POA: Diagnosis not present

## 2024-09-04 DIAGNOSIS — Z1339 Encounter for screening examination for other mental health and behavioral disorders: Secondary | ICD-10-CM | POA: Diagnosis not present

## 2024-09-04 DIAGNOSIS — Z1331 Encounter for screening for depression: Secondary | ICD-10-CM | POA: Diagnosis not present

## 2024-09-04 DIAGNOSIS — E785 Hyperlipidemia, unspecified: Secondary | ICD-10-CM | POA: Diagnosis not present

## 2024-11-03 ENCOUNTER — Other Ambulatory Visit: Payer: Self-pay | Admitting: Cardiovascular Disease

## 2024-11-03 DIAGNOSIS — I480 Paroxysmal atrial fibrillation: Secondary | ICD-10-CM

## 2024-11-04 NOTE — Telephone Encounter (Signed)
 Eliquis  5mg  refill request received. Patient is 89 years old, weight-60.3kg, Crea-1.23 on 02/06/24, Diagnosis-Afib, and last seen by Dr. Wonda on 12/11/23, pending appt 12/2024.Dose is appropriate based on dosing criteria. Will send in refill to requested pharmacy.    Per PharmD [place] okay to stay on current dose of eliquis  since not less that 60kg at this time.

## 2024-11-08 ENCOUNTER — Other Ambulatory Visit: Payer: Self-pay | Admitting: Cardiovascular Disease

## 2024-11-08 DIAGNOSIS — I480 Paroxysmal atrial fibrillation: Secondary | ICD-10-CM

## 2024-11-08 DIAGNOSIS — R002 Palpitations: Secondary | ICD-10-CM

## 2024-11-27 ENCOUNTER — Other Ambulatory Visit: Payer: Self-pay | Admitting: Cardiovascular Disease

## 2024-11-27 DIAGNOSIS — E782 Mixed hyperlipidemia: Secondary | ICD-10-CM

## 2025-01-03 ENCOUNTER — Ambulatory Visit: Admitting: Cardiovascular Disease
